# Patient Record
Sex: Male | Born: 1935 | Race: White | Hispanic: No | Marital: Single | Smoking: Former smoker
Health system: Southern US, Community
[De-identification: ages and names within clinical notes are randomized; demographics above are authoritative.]

## PROBLEM LIST (undated history)

## (undated) DIAGNOSIS — E1159 Type 2 diabetes mellitus with other circulatory complications: Secondary | ICD-10-CM

## (undated) DIAGNOSIS — I2510 Atherosclerotic heart disease of native coronary artery without angina pectoris: Secondary | ICD-10-CM

## (undated) DIAGNOSIS — Z860100 Personal history of colon polyps, unspecified: Secondary | ICD-10-CM

## (undated) DIAGNOSIS — I10 Essential (primary) hypertension: Secondary | ICD-10-CM

## (undated) DIAGNOSIS — I119 Hypertensive heart disease without heart failure: Secondary | ICD-10-CM

## (undated) DIAGNOSIS — E785 Hyperlipidemia, unspecified: Secondary | ICD-10-CM

## (undated) DIAGNOSIS — C259 Malignant neoplasm of pancreas, unspecified: Secondary | ICD-10-CM

## (undated) DIAGNOSIS — C439 Malignant melanoma of skin, unspecified: Secondary | ICD-10-CM

## (undated) DIAGNOSIS — I350 Nonrheumatic aortic (valve) stenosis: Secondary | ICD-10-CM

## (undated) HISTORY — DX: Atherosclerotic heart disease of native coronary artery without angina pectoris: I25.10

## (undated) HISTORY — DX: Nonrheumatic aortic (valve) stenosis: I35.0

## (undated) HISTORY — DX: Type 2 diabetes mellitus with other circulatory complications: E11.59

## (undated) HISTORY — PX: APPENDECTOMY: SHX54

## (undated) HISTORY — DX: Hypertensive heart disease without heart failure: I11.9

## (undated) HISTORY — PX: MASTOID DEBRIDEMENT: SHX2002

## (undated) HISTORY — PX: COLONOSCOPY: SHX174

## (undated) HISTORY — PX: CARDIAC CATHETERIZATION: SHX172

## (undated) HISTORY — DX: Hyperlipidemia, unspecified: E78.5

---
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
## 2001-04-23 HISTORY — PX: OTHER SURGICAL HISTORY: SHX169

## 2002-04-22 ENCOUNTER — Ambulatory Visit (HOSPITAL_BASED_OUTPATIENT_CLINIC_OR_DEPARTMENT_OTHER): Admission: RE | Admit: 2002-04-22 | Discharge: 2002-04-22 | Payer: Self-pay | Admitting: General Surgery

## 2002-04-23 HISTORY — PX: CORONARY STENT PLACEMENT: SHX1402

## 2002-07-02 ENCOUNTER — Encounter: Payer: Self-pay | Admitting: Emergency Medicine

## 2002-07-02 ENCOUNTER — Inpatient Hospital Stay (HOSPITAL_COMMUNITY): Admission: EM | Admit: 2002-07-02 | Discharge: 2002-07-04 | Payer: Self-pay | Admitting: Emergency Medicine

## 2006-04-10 ENCOUNTER — Ambulatory Visit: Payer: Self-pay | Admitting: Internal Medicine

## 2006-05-15 ENCOUNTER — Ambulatory Visit: Payer: Self-pay | Admitting: Internal Medicine

## 2011-05-17 ENCOUNTER — Other Ambulatory Visit: Payer: Self-pay | Admitting: Cardiology

## 2011-05-22 ENCOUNTER — Encounter (INDEPENDENT_AMBULATORY_CARE_PROVIDER_SITE_OTHER): Payer: Self-pay | Admitting: Surgery

## 2011-05-24 ENCOUNTER — Ambulatory Visit (INDEPENDENT_AMBULATORY_CARE_PROVIDER_SITE_OTHER): Payer: Medicare Other | Admitting: Surgery

## 2011-05-24 ENCOUNTER — Encounter (INDEPENDENT_AMBULATORY_CARE_PROVIDER_SITE_OTHER): Payer: Self-pay | Admitting: Surgery

## 2011-05-24 ENCOUNTER — Encounter (INDEPENDENT_AMBULATORY_CARE_PROVIDER_SITE_OTHER): Payer: Self-pay | Admitting: General Surgery

## 2011-05-24 VITALS — BP 134/78 | HR 60 | Temp 97.9°F | Ht 68.0 in | Wt 210.0 lb

## 2011-05-24 DIAGNOSIS — C439 Malignant melanoma of skin, unspecified: Secondary | ICD-10-CM

## 2011-05-24 NOTE — Progress Notes (Signed)
Patient ID: Danny Waters, male   DOB: [birth year], 78 y.o.   MRN: [id number]  Chief Complaint  Patient presents with  . Pre-op Exam    eval melanoma    HPI Danny Waters is a 78 y.o. male.   HPIThis gentleman is referred by Dr. Donzetta Starch for evaluation of recently biopsied melanoma is on his shoulders arms and back. He has had melanomas excised in the past. He currently has no complaints.  Past Medical History  Diagnosis Date  . Hyperlipidemia   . Heart attack     Past Surgical History  Procedure Date  . Malignanat melanoma 2003  . Hernia repair 1957    Family History  Problem Relation Age of Onset  . Heart disease Mother     Social History History  Substance Use Topics  . Smoking status: Former Smoker    Quit date: 05/23/1958  . Smokeless tobacco: Not on file  . Alcohol Use: No    No Known Allergies  Current Outpatient Prescriptions  Medication Sig Dispense Refill  . amLODipine (NORVASC) 5 MG tablet       . atorvastatin (LIPITOR) 40 MG tablet       . losartan-hydrochlorothiazide (HYZAAR) 100-25 MG per tablet       . metoprolol succinate (TOPROL-XL) 100 MG 24 hr tablet         Review of Systems Review of Systems  Constitutional: Negative for fever, chills and unexpected weight change.  HENT: Negative for hearing loss, congestion, sore throat, trouble swallowing and voice change.   Eyes: Negative for visual disturbance.  Respiratory: Negative for cough and wheezing.   Cardiovascular: Negative for chest pain, palpitations and leg swelling.  Gastrointestinal: Negative for nausea, vomiting, abdominal pain, diarrhea, constipation, blood in stool, abdominal distention, anal bleeding and rectal pain.  Genitourinary: Negative for hematuria and difficulty urinating.  Musculoskeletal: Negative for arthralgias.  Skin: Negative for rash and wound.  Neurological: Negative for seizures, syncope, weakness and headaches.  Hematological: Negative for adenopathy. Does not  bruise/bleed easily.  Psychiatric/Behavioral: Negative for confusion.    Blood pressure 134/78, pulse 60, temperature 97.9 F (36.6 C), temperature source Temporal, height 5\' 8"  (1.727 m), weight 210 lb (95.255 kg), SpO2 94.00%.  Physical Exam Physical Exam  Constitutional: He is oriented to person, place, and time. He appears well-developed and well-nourished.  HENT:  Head: Normocephalic and atraumatic.  Right Ear: External ear normal.  Left Ear: External ear normal.  Nose: Nose normal.  Mouth/Throat: Oropharynx is clear and moist. No oropharyngeal exudate.  Eyes: Conjunctivae and EOM are normal. Pupils are equal, round, and reactive to light. No scleral icterus.  Neck: Normal range of motion. Neck supple. No tracheal deviation present. No thyromegaly present.  Cardiovascular: Normal rate, regular rhythm, normal heart sounds and intact distal pulses.   No murmur heard. Pulmonary/Chest: Effort normal and breath sounds normal. No respiratory distress.  Abdominal: Soft. Bowel sounds are normal. He exhibits no distension. There is no tenderness. There is no rebound.  Musculoskeletal: Normal range of motion. He exhibits no edema and no tenderness.  Lymphadenopathy:    He has no cervical adenopathy.    He has no axillary adenopathy.       Right: No inguinal adenopathy present.       Left: No inguinal adenopathy present.  Neurological: He is alert and oriented to person, place, and time.  Skin: Skin is warm and dry. No rash noted. No erythema.       Biopsy  sites on the right posterior shoulder, left upper back, and left lateral upper arm are all healing well without evidence of infection.  Psychiatric: His behavior is normal. Judgment normal.    Data Reviewed I have reviewed the notes from dermatology as well as the pathology reports. The upper back and shoulder lesions or melanoma in situ. The left upper arm lesion shows malignant melanoma which is at least 6 mm in  depth.  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 I cannot feel any left axillary adenopathy. I would discuss with some my partners whether or not we should do sentinel lymph nodes. I suspect he would already have nodal disease at the depth melanoma was found to be adequate. Given his age I do not believe he would need an axillary dissection. I will need to get cardiac clearance prior to surgery. We will notify Dr. Donnie Aho,       Abigail Miyamoto A 05/24/2011, 3:22 PM

## 2011-05-25 ENCOUNTER — Telehealth: Payer: Self-pay | Admitting: Internal Medicine

## 2011-05-25 NOTE — Telephone Encounter (Signed)
pt aware of  2/6 appt and will call dr Yetta Barre to find out reason of appt   aom

## 2011-05-28 ENCOUNTER — Other Ambulatory Visit (INDEPENDENT_AMBULATORY_CARE_PROVIDER_SITE_OTHER): Payer: Self-pay | Admitting: Surgery

## 2011-05-28 ENCOUNTER — Telehealth: Payer: Self-pay | Admitting: Internal Medicine

## 2011-05-28 NOTE — Telephone Encounter (Signed)
Referred by Dr. Donzetta Starch Dx- Malignant Melanoma

## 2011-05-29 ENCOUNTER — Other Ambulatory Visit: Payer: Self-pay | Admitting: Internal Medicine

## 2011-05-29 DIAGNOSIS — C439 Malignant melanoma of skin, unspecified: Secondary | ICD-10-CM

## 2011-05-30 ENCOUNTER — Ambulatory Visit: Payer: Self-pay

## 2011-05-30 ENCOUNTER — Encounter (INDEPENDENT_AMBULATORY_CARE_PROVIDER_SITE_OTHER): Payer: Self-pay

## 2011-05-30 ENCOUNTER — Other Ambulatory Visit: Payer: Self-pay | Admitting: Lab

## 2011-05-30 ENCOUNTER — Telehealth: Payer: Self-pay | Admitting: Internal Medicine

## 2011-05-30 ENCOUNTER — Ambulatory Visit (HOSPITAL_BASED_OUTPATIENT_CLINIC_OR_DEPARTMENT_OTHER): Payer: Medicare Other | Admitting: Internal Medicine

## 2011-05-30 ENCOUNTER — Encounter: Payer: Self-pay | Admitting: Internal Medicine

## 2011-05-30 VITALS — BP 135/79 | HR 62 | Temp 98.6°F | Ht 68.0 in | Wt 208.4 lb

## 2011-05-30 DIAGNOSIS — C439 Malignant melanoma of skin, unspecified: Secondary | ICD-10-CM

## 2011-05-30 LAB — CBC WITH DIFFERENTIAL/PLATELET
Basophils Absolute: 0 10*3/uL (ref 0.0–0.1)
EOS%: 1.1 % (ref 0.0–7.0)
Eosinophils Absolute: 0.1 10*3/uL (ref 0.0–0.5)
HCT: 43.7 % (ref 38.4–49.9)
HGB: 15.1 g/dL (ref 13.0–17.1)
MCH: 32.3 pg (ref 27.2–33.4)
MCV: 93.3 fL (ref 79.3–98.0)
MONO%: 5 % (ref 0.0–14.0)
NEUT#: 6.3 10*3/uL (ref 1.5–6.5)
NEUT%: 67.5 % (ref 39.0–75.0)
RDW: 13.8 % (ref 11.0–14.6)

## 2011-05-30 LAB — COMPREHENSIVE METABOLIC PANEL
Albumin: 4.2 g/dL (ref 3.5–5.2)
BUN: 18 mg/dL (ref 6–23)
CO2: 30 mEq/L (ref 19–32)
Glucose, Bld: 161 mg/dL — ABNORMAL HIGH (ref 70–99)
Potassium: 3.9 mEq/L (ref 3.5–5.3)
Sodium: 141 mEq/L (ref 135–145)
Total Bilirubin: 1 mg/dL (ref 0.3–1.2)
Total Protein: 6.5 g/dL (ref 6.0–8.3)

## 2011-05-30 NOTE — Telephone Encounter (Signed)
appts made for 2/26 and 2/27 and printed,contrast given aom

## 2011-05-30 NOTE — Progress Notes (Signed)
[facility] CONSULT NOTE  REASON FOR CONSULTATION:  78 years old white male diagnosed with malignant melanoma  HPI MERVIL WACKER is a 78 y.o. male was past medical history significant for coronary artery disease, hypertension, dyslipidemia, obesity, diabetes mellitus and history of aortic valve stenosis. The patient mentions that 8 years ago he was diagnosed with melanoma in the mid upper back. He had a wide excision at that time by Dr. Maple Hudson. Recently he noted a lesion on the left arm that was itching and start bleeding. He was seen by his dermatologist Dr. Donzetta Starch. He was found to have 3 lesions, one in the upper back and biopsy showed melanoma in situ, the second was in the right posterior shoulder and this also was consistent with melanoma in situ, the stab-wound was on the left upper arm and this was consistent with malignant melanoma, Clark's level IV, Breslow's measurement at least 6 mm, mitosis was more than 20//MM square, which is absent regression and positive ulceration. There was no vascular invasion by the peripheral and deep margins were involved with melanoma. The pathologic stage was T4b, pNX, pMX . This was a large ulcerated nodular melanoma with spindle cell morphology and very brisk mitotic activity. The patient was referred to Dr. Abigail Miyamoto but no decision was made yet regarding the time for surgical excision. The patient was referred to me today for evaluation and recommendation regarding his malignant melanoma. He is feeling fine with no specific complaints. He intentionally lost around 30 pounds over the last few months. He has no chest pain or shortness of breath, no cough or hemoptysis, no headache or blurry vision. He is currently retired but used to work at the [employer].  @SFHPI @  Past Medical History  Diagnosis Date  . Hyperlipidemia   . Heart attack     Past Surgical History  Procedure Date  . Malignanat melanoma 2003  .  Hernia repair 1957    Family History  Problem Relation Age of Onset  . Heart disease Mother     Social History History  Substance Use Topics  . Smoking status: Former Smoker    Quit date: 05/23/1958  . Smokeless tobacco: Not on file  . Alcohol Use: No    No Known Allergies  Current Outpatient Prescriptions  Medication Sig Dispense Refill  . amLODipine (NORVASC) 5 MG tablet       . aspirin 81 MG tablet Take 160 mg by mouth daily.      Marland Kitchen atorvastatin (LIPITOR) 40 MG tablet       . losartan-hydrochlorothiazide (HYZAAR) 100-25 MG per tablet       . metoprolol succinate (TOPROL-XL) 100 MG 24 hr tablet       . Multiple Vitamin (MULTIVITAMIN) tablet Take 1 tablet by mouth daily.      . calcium carbonate (TUMS - DOSED IN MG ELEMENTAL CALCIUM) 500 MG chewable tablet Chew 1 tablet by mouth daily.        Review of Systems  A comprehensive review of systems was negative.  Physical Exam  ZOX:WRUEA, healthy, no distress, well nourished and well developed SKIN: positive for: scar from previous melanoma biopsies in left arm, right shoulder and back. HEAD: Normocephalic, No masses, lesions, tenderness or abnormalities EYES: normal, PERRLA EARS: External ears normal OROPHARYNX:no exudate, no erythema and lips, buccal mucosa, and tongue normal  NECK: supple, no adenopathy LYMPH:  no palpable lymphadenopathy, no hepatosplenomegaly LUNGS: clear to auscultation , and  palpation HEART: regular rate & rhythm, no murmurs and no gallops ABDOMEN:abdomen soft, non-tender and normal bowel sounds BACK: Back symmetric, no curvature. EXTREMITIES:no joint deformities, effusion, or inflammation, no edema, no skin discoloration, no clubbing, no cyanosis  NEURO: alert & oriented x 3 with fluent speech, no focal motor/sensory deficits, gait normal  ASSESSMENT: This is a very pleasant 78 years old white male recently diagnosed with at least stage IIC (T4b, Nx, Mx) left arm. There is also has melanoma in  situ involving the right shoulder and upper back. I have a lengthy discussion with the patient today about his disease stage, prognosis and treatment options.  PLAN: #1 the patient will need wide excision and sentinel lymph node biopsy of the left arm lesion to rule out metastatic spread to the regional lymph nodes specially with the deep thickness of the melanoma. He is already seen by Dr. Magnus Ivan for this procedure pathology is not scheduled yet. #2 I ordered a CT scan of the head, chest, abdomen and pelvis to rule out any metastatic disease. #3 I would see the patient back for followup visit in 3 weeks for reevaluation. Hopefully he would have his wide excision and sentinel lymph node biopsy performed by that time. The patient was advised to call me immediately if he has any concerning symptoms in the interval.  All questions were answered. The patient knows to call the clinic with any problems, questions or concerns. We can certainly see the patient much sooner if necessary.  Thank you so much for allowing me to participate in the care of Danny Waters. I will continue to follow up the patient with you and assist in his care.  I spent 30 minutes counseling the patient face to face. The total time spent in the appointment was 55 minutes.   Jaymeson Mengel K. 05/30/2011, 9:59 PM

## 2011-05-31 ENCOUNTER — Encounter (HOSPITAL_COMMUNITY): Payer: Self-pay | Admitting: Pharmacy Technician

## 2011-06-05 ENCOUNTER — Encounter (HOSPITAL_COMMUNITY): Payer: Self-pay

## 2011-06-05 ENCOUNTER — Encounter (HOSPITAL_COMMUNITY)
Admission: RE | Admit: 2011-06-05 | Discharge: 2011-06-05 | Disposition: A | Discharge status: Home / Self Care | Payer: Medicare Other | Source: Ambulatory Visit | Attending: Surgery | Admitting: Surgery

## 2011-06-05 ENCOUNTER — Encounter (HOSPITAL_COMMUNITY)
Admission: RE | Admit: 2011-06-05 | Discharge: 2011-06-05 | Disposition: A | Discharge status: Home / Self Care | Payer: Medicare Other | Source: Ambulatory Visit | Attending: Anesthesiology | Admitting: Anesthesiology

## 2011-06-05 ENCOUNTER — Telehealth (INDEPENDENT_AMBULATORY_CARE_PROVIDER_SITE_OTHER): Payer: Self-pay

## 2011-06-05 HISTORY — DX: Personal history of colon polyps, unspecified: Z86.0100

## 2011-06-05 HISTORY — DX: Essential (primary) hypertension: I10

## 2011-06-05 HISTORY — DX: Personal history of colonic polyps: Z86.010

## 2011-06-05 LAB — SURGICAL PCR SCREEN
MRSA, PCR: NEGATIVE
Staphylococcus aureus: POSITIVE — AB

## 2011-06-05 NOTE — Progress Notes (Signed)
Dr.tilley is cardiologist;last visit 3wks ago-to request OV  To request echo report-pt states it was done a couple of months ago Stress test done about 3-82yrs ago to also request report  EKG done 3wks ago-to request

## 2011-06-05 NOTE — Pre-Procedure Instructions (Signed)
20 Danny Waters  06/05/2011   Your procedure is scheduled on:  Thurs, Feb 14 @ 1255pm  Report to [facility] at [phone] AM.  Call this number if you have problems the morning of surgery: [phone]   Remember:   Do not eat food:After Midnight.  May have clear liquids: up to 4 Hours before arrival.(until 5:45am)  Clear liquids include soda, tea, black coffee, apple or grape juice, broth.  Take these medicines the morning of surgery with A SIP OF WATER: Amlodipine and Metoprolol   Do not wear jewelry, make-up or nail polish.  Do not wear lotions, powders, or perfumes. You may wear deodorant.  Do not shave 48 hours prior to surgery.  Do not bring valuables to the hospital.  Contacts, dentures or bridgework may not be worn into surgery.  Leave suitcase in the car. After surgery it may be brought to your room.  For patients admitted to the hospital, checkout time is 11:00 AM the day of discharge.   Patients discharged the day of surgery will not be allowed to drive home.  Name and phone number of your driver:   Special Instructions: CHG Shower Use Special Wash: 1/2 bottle night before surgery and 1/2 bottle morning of surgery.   Please read over the following fact sheets that you were given: Pain Booklet, Coughing and Deep Breathing, MRSA Information and Surgical Site Infection Prevention

## 2011-06-05 NOTE — Telephone Encounter (Signed)
Christy for [facility] called to report that Danny Waters [birth year] is to have surgery on 06/07/2011.  He does not have anyone to be with him after the surgery. (MELANOMA EXCISION Right Choice wide excision melanoma right shoulder and left back and left upper arm).

## 2011-06-05 NOTE — Telephone Encounter (Signed)
That's ok.

## 2011-06-05 NOTE — Progress Notes (Signed)
Spoke with Robin at [facility] office and notified that pt has no one that can stay with him for the 1st 24hr after surgery-will let Dr.Blackman know

## 2011-06-05 NOTE — Progress Notes (Signed)
Per Dr.Tilley's office-last ekg was done 05/09/10--will need DOS

## 2011-06-06 MED ORDER — CEFAZOLIN SODIUM-DEXTROSE 2-3 GM-% IV SOLR
2.0000 g | INTRAVENOUS | Status: AC
  Administered 2011-06-07: 2 g via INTRAVENOUS
  Filled 2011-06-06: qty 50

## 2011-06-06 NOTE — H&P (Signed)
HPI  Danny Waters is a 78 y.o. male.  HPIThis gentleman is referred by Dr. Donzetta Waters for evaluation of recently biopsied melanoma is on his shoulders arms and back. He has had melanomas excised in the past. He currently has no complaints.  Past Medical History   Diagnosis  Date   .  Hyperlipidemia    .  Heart attack     Past Surgical History   Procedure  Date   .  Malignanat melanoma  2003   .  Hernia repair  1957    Family History   Problem  Relation  Age of Onset   .  Heart disease  Mother     Social History  History   Substance Use Topics   .  Smoking status:  Former Smoker     Quit date:  05/23/1958   .  Smokeless tobacco:  Not on file   .  Alcohol Use:  No    No Known Allergies  Current Outpatient Prescriptions   Medication  Sig  Dispense  Refill   .  amLODipine (NORVASC) 5 MG tablet      .  atorvastatin (LIPITOR) 40 MG tablet      .  losartan-hydrochlorothiazide (HYZAAR) 100-25 MG per tablet      .  metoprolol succinate (TOPROL-XL) 100 MG 24 hr tablet       Review of Systems  Review of Systems  Constitutional: Negative for fever, chills and unexpected weight change.  HENT: Negative for hearing loss, congestion, sore throat, trouble swallowing and voice change.  Eyes: Negative for visual disturbance.  Respiratory: Negative for cough and wheezing.  Cardiovascular: Negative for chest pain, palpitations and leg swelling.  Gastrointestinal: Negative for nausea, vomiting, abdominal pain, diarrhea, constipation, blood in stool, abdominal distention, anal bleeding and rectal pain.  Genitourinary: Negative for hematuria and difficulty urinating.  Musculoskeletal: Negative for arthralgias.  Skin: Negative for rash and wound.  Neurological: Negative for seizures, syncope, weakness and headaches.  Hematological: Negative for adenopathy. Does not bruise/bleed easily.  Psychiatric/Behavioral: Negative for confusion.   Blood pressure 134/78, pulse 60, temperature 97.9 F  (36.6 C), temperature source Temporal, height 5\' 8"  (1.727 m), weight 210 lb (95.255 kg), SpO2 94.00%.  Physical Exam  Physical Exam  Constitutional: He is oriented to person, place, and time. He appears well-developed and well-nourished.  HENT:  Head: Normocephalic and atraumatic.  Right Ear: External ear normal.  Left Ear: External ear normal.  Nose: Nose normal.  Mouth/Throat: Oropharynx is clear and moist. No oropharyngeal exudate.  Eyes: Conjunctivae and EOM are normal. Pupils are equal, round, and reactive to light. No scleral icterus.  Neck: Normal range of motion. Neck supple. No tracheal deviation present. No thyromegaly present.  Cardiovascular: Normal rate, regular rhythm, normal heart sounds and intact distal pulses.  No murmur heard.  Pulmonary/Chest: Effort normal and breath sounds normal. No respiratory distress.  Abdominal: Soft. Bowel sounds are normal. He exhibits no distension. There is no tenderness. There is no rebound.  Musculoskeletal: Normal range of motion. He exhibits no edema and no tenderness.  Lymphadenopathy:  He has no cervical adenopathy.  He has no axillary adenopathy.  Right: No inguinal adenopathy present.  Left: No inguinal adenopathy present.  Neurological: He is alert and oriented to person, place, and time.  Skin: Skin is warm and dry. No rash noted. No erythema.  Biopsy sites on the right posterior shoulder, left upper back, and left lateral upper arm are all healing well  without evidence of infection.  Psychiatric: His behavior is normal. Judgment normal.   Data Reviewed  I have reviewed the notes from dermatology as well as the pathology reports. The upper back and shoulder lesions or melanoma in situ. The left upper arm lesion shows malignant melanoma which is at least 6 mm in depth.  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 I cannot feel any left axillary adenopathy. I would discuss with some my partners whether or not we should do sentinel lymph nodes. I suspect he would already have nodal disease at the depth melanoma was found to be adequate. Given his age I do not believe he would need an axillary dissection. I will need to get cardiac clearance prior to surgery. We will notify Dr. Donnie Waters,   [facility] A  Addendum: I have discussed this with other surgeons.  Sentinel node biopsy not warranted.  Cardiac clearance achieved.  Will proceed to OR for wide excision

## 2011-06-07 ENCOUNTER — Other Ambulatory Visit: Payer: Self-pay

## 2011-06-07 ENCOUNTER — Encounter (HOSPITAL_COMMUNITY): Payer: Self-pay | Admitting: Anesthesiology

## 2011-06-07 ENCOUNTER — Encounter (HOSPITAL_COMMUNITY): Payer: Self-pay | Admitting: *Deleted

## 2011-06-07 ENCOUNTER — Ambulatory Visit (HOSPITAL_COMMUNITY)
Admission: RE | Admit: 2011-06-07 | Discharge: 2011-06-07 | Disposition: A | Discharge status: Home / Self Care | Payer: Medicare Other | Source: Ambulatory Visit | Attending: Surgery | Admitting: Surgery

## 2011-06-07 ENCOUNTER — Ambulatory Visit (HOSPITAL_COMMUNITY): Payer: Medicare Other | Admitting: Anesthesiology

## 2011-06-07 ENCOUNTER — Other Ambulatory Visit (INDEPENDENT_AMBULATORY_CARE_PROVIDER_SITE_OTHER): Payer: Self-pay | Admitting: Surgery

## 2011-06-07 ENCOUNTER — Encounter (HOSPITAL_COMMUNITY)
Admission: RE | Disposition: A | Discharge status: Home / Self Care | Payer: Self-pay | Source: Ambulatory Visit | Attending: Surgery

## 2011-06-07 DIAGNOSIS — E119 Type 2 diabetes mellitus without complications: Secondary | ICD-10-CM | POA: Insufficient documentation

## 2011-06-07 DIAGNOSIS — E669 Obesity, unspecified: Secondary | ICD-10-CM | POA: Insufficient documentation

## 2011-06-07 DIAGNOSIS — Z01812 Encounter for preprocedural laboratory examination: Secondary | ICD-10-CM | POA: Insufficient documentation

## 2011-06-07 DIAGNOSIS — Z01818 Encounter for other preprocedural examination: Secondary | ICD-10-CM | POA: Insufficient documentation

## 2011-06-07 DIAGNOSIS — C4360 Malignant melanoma of unspecified upper limb, including shoulder: Secondary | ICD-10-CM

## 2011-06-07 DIAGNOSIS — C4359 Malignant melanoma of other part of trunk: Secondary | ICD-10-CM | POA: Insufficient documentation

## 2011-06-07 DIAGNOSIS — I2510 Atherosclerotic heart disease of native coronary artery without angina pectoris: Secondary | ICD-10-CM | POA: Insufficient documentation

## 2011-06-07 DIAGNOSIS — I10 Essential (primary) hypertension: Secondary | ICD-10-CM | POA: Insufficient documentation

## 2011-06-07 HISTORY — PX: MELANOMA EXCISION: SHX5266

## 2011-06-07 LAB — GLUCOSE, CAPILLARY

## 2011-06-07 SURGERY — EXCISION, MELANOMA
Anesthesia: Monitor Anesthesia Care | Site: Shoulder | Laterality: Right | Wound class: Clean

## 2011-06-07 MED ORDER — ONDANSETRON HCL 4 MG/2ML IJ SOLN: 4.0000 mg | Freq: Four times a day (QID) | INTRAMUSCULAR | Status: DC | PRN

## 2011-06-07 MED ORDER — HYDROCODONE-ACETAMINOPHEN 5-325 MG PO TABS: 1.0000 | ORAL_TABLET | ORAL | Status: AC | PRN

## 2011-06-07 MED ORDER — ACETAMINOPHEN 650 MG RE SUPP: 650.0000 mg | RECTAL | Status: DC | PRN

## 2011-06-07 MED ORDER — SODIUM CHLORIDE 0.9 % IJ SOLN: 3.0000 mL | Freq: Two times a day (BID) | INTRAMUSCULAR | Status: DC

## 2011-06-07 MED ORDER — PROPOFOL 10 MG/ML IV EMUL
INTRAVENOUS | Status: DC | PRN
  Administered 2011-06-07: 30 mg via INTRAVENOUS

## 2011-06-07 MED ORDER — LACTATED RINGERS IV SOLN
INTRAVENOUS | Status: DC
  Administered 2011-06-07: 12:00:00 via INTRAVENOUS

## 2011-06-07 MED ORDER — SODIUM CHLORIDE 0.9 % IJ SOLN: 3.0000 mL | INTRAMUSCULAR | Status: DC | PRN

## 2011-06-07 MED ORDER — FENTANYL CITRATE 0.05 MG/ML IJ SOLN: 50.0000 ug | INTRAMUSCULAR | Status: DC | PRN

## 2011-06-07 MED ORDER — LORAZEPAM 2 MG/ML IJ SOLN: 1.0000 mg | Freq: Once | INTRAMUSCULAR | Status: DC | PRN

## 2011-06-07 MED ORDER — ACETAMINOPHEN 325 MG PO TABS: 650.0000 mg | ORAL_TABLET | ORAL | Status: DC | PRN

## 2011-06-07 MED ORDER — SODIUM CHLORIDE 0.9 % IV SOLN: 250.0000 mL | INTRAVENOUS | Status: DC | PRN

## 2011-06-07 MED ORDER — FENTANYL CITRATE 0.05 MG/ML IJ SOLN
INTRAMUSCULAR | Status: DC | PRN
  Administered 2011-06-07: 100 ug via INTRAVENOUS
  Administered 2011-06-07 (×2): 25 ug via INTRAVENOUS

## 2011-06-07 MED ORDER — HYDROMORPHONE HCL PF 1 MG/ML IJ SOLN: 0.2500 mg | INTRAMUSCULAR | Status: DC | PRN

## 2011-06-07 MED ORDER — MIDAZOLAM HCL 2 MG/2ML IJ SOLN: 1.0000 mg | INTRAMUSCULAR | Status: DC | PRN

## 2011-06-07 MED ORDER — PROMETHAZINE HCL 25 MG/ML IJ SOLN: 6.2500 mg | INTRAMUSCULAR | Status: DC | PRN

## 2011-06-07 MED ORDER — MORPHINE SULFATE 2 MG/ML IJ SOLN: 2.0000 mg | INTRAMUSCULAR | Status: DC | PRN

## 2011-06-07 MED ORDER — LACTATED RINGERS IV SOLN
INTRAVENOUS | Status: DC | PRN
  Administered 2011-06-07: 12:00:00 via INTRAVENOUS

## 2011-06-07 MED ORDER — LIDOCAINE HCL 1 % IJ SOLN
INTRAMUSCULAR | Status: DC | PRN
  Administered 2011-06-07: 13:00:00 via SUBCUTANEOUS

## 2011-06-07 MED ORDER — PROMETHAZINE HCL 25 MG/ML IJ SOLN: 12.5000 mg | Freq: Four times a day (QID) | INTRAMUSCULAR | Status: DC | PRN

## 2011-06-07 MED ORDER — OXYCODONE HCL 5 MG PO TABS: 5.0000 mg | ORAL_TABLET | ORAL | Status: DC | PRN

## 2011-06-07 MED ORDER — 0.9 % SODIUM CHLORIDE (POUR BTL) OPTIME
TOPICAL | Status: DC | PRN
  Administered 2011-06-07: 1000 mL

## 2011-06-07 SURGICAL SUPPLY — 48 items
APL SKNCLS STERI-STRIP NONHPOA (GAUZE/BANDAGES/DRESSINGS) ×1
BENZOIN TINCTURE PRP APPL 2/3 (GAUZE/BANDAGES/DRESSINGS) ×2 IMPLANT
BLADE SURG 10 STRL SS (BLADE) ×2 IMPLANT
BLADE SURG 15 STRL LF DISP TIS (BLADE) ×1 IMPLANT
BLADE SURG 15 STRL SS (BLADE) ×2
BLADE SURG ROTATE 9660 (MISCELLANEOUS) IMPLANT
CANISTER SUCTION 2500CC (MISCELLANEOUS) IMPLANT
CHLORAPREP W/TINT 26ML (MISCELLANEOUS) ×2 IMPLANT
CLOTH BEACON ORANGE TIMEOUT ST (SAFETY) ×2 IMPLANT
COVER SURGICAL LIGHT HANDLE (MISCELLANEOUS) ×2 IMPLANT
DECANTER SPIKE VIAL GLASS SM (MISCELLANEOUS) ×2 IMPLANT
DRAPE ORTHO SPLIT 77X108 STRL (DRAPES) ×4
DRAPE PED LAPAROTOMY (DRAPES) IMPLANT
DRAPE SURG ORHT 6 SPLT 77X108 (DRAPES) IMPLANT
ELECT CAUTERY BLADE 6.4 (BLADE) ×2 IMPLANT
ELECT REM PT RETURN 9FT ADLT (ELECTROSURGICAL) ×2
ELECTRODE REM PT RTRN 9FT ADLT (ELECTROSURGICAL) ×1 IMPLANT
GAUZE SPONGE 4X4 16PLY XRAY LF (GAUZE/BANDAGES/DRESSINGS) ×2 IMPLANT
GLOVE SURG SIGNA 7.5 PF LTX (GLOVE) ×2 IMPLANT
GOWN PREVENTION PLUS XLARGE (GOWN DISPOSABLE) ×2 IMPLANT
GOWN STRL NON-REIN LRG LVL3 (GOWN DISPOSABLE) ×2 IMPLANT
KIT BASIN OR (CUSTOM PROCEDURE TRAY) ×2 IMPLANT
KIT ROOM TURNOVER OR (KITS) ×2 IMPLANT
NDL HYPO 25GX1X1/2 BEV (NEEDLE) ×1 IMPLANT
NEEDLE HYPO 25GX1X1/2 BEV (NEEDLE) ×2 IMPLANT
NS IRRIG 1000ML POUR BTL (IV SOLUTION) ×2 IMPLANT
PACK SURGICAL SETUP 50X90 (CUSTOM PROCEDURE TRAY) ×2 IMPLANT
PAD ARMBOARD 7.5X6 YLW CONV (MISCELLANEOUS) ×4 IMPLANT
PENCIL BUTTON HOLSTER BLD 10FT (ELECTRODE) ×2 IMPLANT
SPONGE GAUZE 4X4 12PLY (GAUZE/BANDAGES/DRESSINGS) ×4 IMPLANT
SPONGE LAP 18X18 X RAY DECT (DISPOSABLE) ×2 IMPLANT
STRIP CLOSURE SKIN 1/2X4 (GAUZE/BANDAGES/DRESSINGS) IMPLANT
SUT ETHILON 2 0 FS 18 (SUTURE) ×4 IMPLANT
SUT ETHILON 3 0 FSL (SUTURE) IMPLANT
SUT MNCRL AB 4-0 PS2 18 (SUTURE) IMPLANT
SUT VIC AB 2-0 SH 27 (SUTURE) ×2
SUT VIC AB 2-0 SH 27X BRD (SUTURE) IMPLANT
SUT VIC AB 3-0 SH 27 (SUTURE)
SUT VIC AB 3-0 SH 27XBRD (SUTURE) IMPLANT
SYR BULB 3OZ (MISCELLANEOUS) ×2 IMPLANT
SYR CONTROL 10ML LL (SYRINGE) ×2 IMPLANT
TAPE CLOTH SURG 4X10 WHT LF (GAUZE/BANDAGES/DRESSINGS) ×3 IMPLANT
TOWEL OR 17X24 6PK STRL BLUE (TOWEL DISPOSABLE) ×2 IMPLANT
TOWEL OR 17X26 10 PK STRL BLUE (TOWEL DISPOSABLE) ×2 IMPLANT
TUBE CONNECTING 12X1/4 (SUCTIONS) IMPLANT
UNDERPAD 30X30 INCONTINENT (UNDERPADS AND DIAPERS) ×2 IMPLANT
WATER STERILE IRR 1000ML POUR (IV SOLUTION) IMPLANT
YANKAUER SUCT BULB TIP NO VENT (SUCTIONS) IMPLANT

## 2011-06-07 NOTE — Consult Note (Signed)
Anesthesia:  Patient is a 78 year old male scheduled for wide excision melanoma, right shoulder, left back, and LUA today.  He was seen in PAT on 06/05/11, but I was just brought his chart to review.   His history is significant for severe asymptomatic AS which is followed by Dr. Donnie Aho.  He was just seen on 05/09/11 and had a repeat echo.  Six month follow-up was recommended.  Other history includes HLD, HTN, DM2, former smoker, CAD/MI s/p Taxus stent to his LAD March 2004, HLD, obesity.  EKG shows NSR, LAD, possible anterolateral infarct.  Echo from 05/17/11 showed LVH with normal systolic function, severe AS with valve gradient unchanged, mild LAE, mild mitral annular calcification, mild TR with mild pulmonary HTN.  (Full report on chart for review.)  CXR shows no active disease. Mild elevation of the right hemidiaphragm.   Labs reviewed.  Dr. Eliberto Ivory note states that he was going to notify Dr. Donnie Aho of patients planned procedure for clearance, although I do not see a formal clearance note in Epic. He does have recent Cardiology visit, however.  His Anesthesiologist will evaluate him in the Holding area.

## 2011-06-07 NOTE — Anesthesia Preprocedure Evaluation (Addendum)
Anesthesia Evaluation  Patient identified by MRN, date of birth, ID band Patient awake    Reviewed: Allergy & Precautions, H&P , NPO status , Patient's Chart, lab work & pertinent test results  Airway Mallampati: II TM Distance: >3 FB Neck ROM: Full    Dental   Pulmonary    Pulmonary exam normal       Cardiovascular hypertension, + CAD  Severe AS, EF 55% (04/2011) LAD Taxus stent 2004   Neuro/Psych    GI/Hepatic   Endo/Other  Diabetes mellitus-  Renal/GU      Musculoskeletal   Abdominal (+) obese,   Peds  Hematology   Anesthesia Other Findings   Reproductive/Obstetrics                        Anesthesia Physical Anesthesia Plan  ASA: III  Anesthesia Plan: MAC   Post-op Pain Management:    Induction: Intravenous  Airway Management Planned: Simple Face Mask  Additional Equipment:   Intra-op Plan:   Post-operative Plan:   Informed Consent: I have reviewed the patients History and Physical, chart, labs and discussed the procedure including the risks, benefits and alternatives for the proposed anesthesia with the patient or authorized representative who has indicated his/her understanding and acceptance.     Plan Discussed with: CRNA and Surgeon  Anesthesia Plan Comments:         Anesthesia Quick Evaluation

## 2011-06-07 NOTE — Anesthesia Postprocedure Evaluation (Signed)
Anesthesia Post Note  Patient: Danny Waters  Procedure(s) Performed: Procedure(s) (LRB): MELANOMA EXCISION (Right)  Anesthesia type: MAC  Patient location: PACU  Post pain: Pain level controlled and Adequate analgesia  Post assessment: Post-op Vital signs reviewed, Patient's Cardiovascular Status Stable and Respiratory Function Stable  Last Vitals:  Filed Vitals:   06/07/11 1345  BP: 107/56  Pulse: 63  Temp:   Resp: 18    Post vital signs: Reviewed and stable  Level of consciousness: awake, alert  and oriented  Complications: No apparent anesthesia complications

## 2011-06-07 NOTE — Interval H&P Note (Signed)
History and Physical Interval Note:  He has had no change in his history or exam  06/07/2011 11:47 AM  Danny Waters  has presented today for surgery, with the diagnosis of melanoma right shoulder left back left upper arm   The various methods of treatment have been discussed with the patient and family. After consideration of risks, benefits and other options for treatment, the patient has consented to  Procedure(s) (LRB): MELANOMA EXCISION RIGHT SHOULDER, LEFT BACK, LEFT POSTERIOR ARM (Right) as a surgical intervention .  The patients' history has been reviewed, patient examined, no change in status, stable for surgery.  I have reviewed the patients' chart and labs.  Questions were answered to the patient's satisfaction.     [name] A

## 2011-06-07 NOTE — Preoperative (Signed)
Beta Blockers   Reason not to administer Beta Blockers:Not Applicable 

## 2011-06-07 NOTE — Op Note (Signed)
06/07/2011  1:18 PM  PATIENT:  Danny Waters  78 y.o. male  PRE-OPERATIVE DIAGNOSIS:  melanoma right shoulder, left back, left upper arm   POST-OPERATIVE DIAGNOSIS:  melanoma right shoulder, left back, left upper arm   PROCEDURE:  Procedure(s) (LRB): 4 CM WIDE EXCISION MALIGNANT MELANOMA LEFT UPPER ARM, MELANOMA IN SITU LEFT BACK AND RIGHT SHOULDER  SURGEON:  Surgeon(s) and Role:    * Shelly Rubenstein, MD - Primary  PHYSICIAN ASSISTANT:   ASSISTANTS: none   ANESTHESIA:   general and IV sedation  EBL:  Total I/O In: 700 [I.V.:700] Out: -   BLOOD ADMINISTERED:none  DRAINS: none   LOCAL MEDICATIONS USED:  MARCAINE    and LIDOCAINE   SPECIMEN:  Excision  DISPOSITION OF SPECIMEN:  PATHOLOGY  COUNTS:  YES  TOURNIQUET:  * No tourniquets in log *  DICTATION: Reubin Milan Dictation Indications: This is a 78 year old gentleman with a malignant melanoma on his left upper arm posteriorly. He also has a melanoma in situ on his mid back toward the left and his posterior right shoulder. The decision has been made to proceed with wide excision of these areas.  Procedure: The patient was brought to the operating room and identified as the correct patient. He was placed on the operating room table anesthesia was induced. The patient was placed in the right lateral decubitus position. His posterior left arm and left back were then prepped and draped in the usual sterile fashion. I anesthetized both areas with lidocaine. Marcaine was mixed in. I then used a scalpel to perform a 4 cm wide excision about the area on the arm and on the back. Both specimens were then removed with the electrocautery. Thereby sent to pathology for evaluation. Hemostasis was achieved with cautery. I closed both wounds with subcuticular Vicryl sutures and then closed both skin incisions with interrupted 2-0 nylon sutures. These wounds were then dressed with dry gauze and tape. The patient was then turned into the left  lateral decubitus position. His right posterior shoulder was prepped and draped in the sterile fashion. I again anesthetized the skin with a mixture of lidocaine and Marcaine. I performed a 4 cm wide excision with a scalpel the melanoma in situ of the right posterior shoulder. I completely excised the rest of the lesion with the cautery. The specimen was sent to pathology as well. I achieved hemostasis with the cautery. I then closed the subcutaneous tissue with interrupted Vicryl sutures and closed the skin with interrupted 2-0 nylon sutures. Gauze was placed on this incision as well. The patient tolerated the procedure well.  all the counts were correct at the end of the procedure. The patient was then taken in a stable condition to the recovery room PLAN OF CARE: Discharge to home after PACU  PATIENT DISPOSITION:  PACU - hemodynamically stable.   Delay start of Pharmacological VTE agent (>24hrs) due to surgical blood loss or risk of bleeding: not applicable

## 2011-06-07 NOTE — Transfer of Care (Signed)
Immediate Anesthesia Transfer of Care Note  Patient: Danny Waters  Procedure(s) Performed: Procedure(s) (LRB): MELANOMA EXCISION (Right)  Patient Location: PACU  Anesthesia Type: MAC  Level of Consciousness: awake, alert  and oriented  Airway & Oxygen Therapy: Patient Spontanous Breathing  Post-op Assessment: Report given to PACU RN  Post vital signs: Reviewed and stable  Complications: No apparent anesthesia complications

## 2011-06-07 NOTE — Discharge Instructions (Signed)
May shower starting tomorrow  Cover with dry bandage daily

## 2011-06-11 ENCOUNTER — Encounter (HOSPITAL_COMMUNITY): Payer: Self-pay | Admitting: Surgery

## 2011-06-18 ENCOUNTER — Encounter (INDEPENDENT_AMBULATORY_CARE_PROVIDER_SITE_OTHER): Payer: Self-pay | Admitting: Surgery

## 2011-06-18 ENCOUNTER — Ambulatory Visit (INDEPENDENT_AMBULATORY_CARE_PROVIDER_SITE_OTHER): Payer: Medicare Other | Admitting: Surgery

## 2011-06-18 VITALS — BP 125/77 | HR 74 | Temp 98.6°F | Resp 18 | Ht 68.0 in | Wt 208.2 lb

## 2011-06-18 DIAGNOSIS — Z09 Encounter for follow-up examination after completed treatment for conditions other than malignant neoplasm: Secondary | ICD-10-CM

## 2011-06-18 NOTE — Progress Notes (Signed)
Subjective:     Patient ID: Danny Waters, male   DOB: [birth year], 78 y.o.   MRN: [id number]  HPI He is here for a postoperative visit. He has no complaints.  Review of Systems     Objective:   Physical Exam On exam, all 3 incision sites are healing well. The final pathology showed no residual in situ disease or malignant melanoma    Assessment:     Patient status post wide excision of 2 areas of melanoma in situ and one deep malignant melanoma of left arm   Plan:     We removed his sutures in place Steri-Strips. He may resume normal activity. I will see him back as needed. His workup is ongoing for evidence of metastatic disease

## 2011-06-19 ENCOUNTER — Ambulatory Visit (HOSPITAL_COMMUNITY)
Admission: RE | Admit: 2011-06-19 | Discharge: 2011-06-19 | Disposition: A | Discharge status: Home / Self Care | Payer: Medicare Other | Source: Ambulatory Visit | Attending: Internal Medicine | Admitting: Internal Medicine

## 2011-06-19 ENCOUNTER — Encounter (HOSPITAL_COMMUNITY): Payer: Self-pay

## 2011-06-19 ENCOUNTER — Other Ambulatory Visit: Payer: Self-pay | Admitting: Lab

## 2011-06-19 DIAGNOSIS — N281 Cyst of kidney, acquired: Secondary | ICD-10-CM | POA: Insufficient documentation

## 2011-06-19 DIAGNOSIS — C439 Malignant melanoma of skin, unspecified: Secondary | ICD-10-CM

## 2011-06-19 DIAGNOSIS — G9389 Other specified disorders of brain: Secondary | ICD-10-CM | POA: Insufficient documentation

## 2011-06-19 DIAGNOSIS — N400 Enlarged prostate without lower urinary tract symptoms: Secondary | ICD-10-CM | POA: Insufficient documentation

## 2011-06-19 DIAGNOSIS — C4359 Malignant melanoma of other part of trunk: Secondary | ICD-10-CM | POA: Insufficient documentation

## 2011-06-19 DIAGNOSIS — K7689 Other specified diseases of liver: Secondary | ICD-10-CM | POA: Insufficient documentation

## 2011-06-19 MED ORDER — IOHEXOL 300 MG/ML  SOLN
100.0000 mL | Freq: Once | INTRAMUSCULAR | Status: AC | PRN
  Administered 2011-06-19: 100 mL via INTRAVENOUS

## 2011-06-20 ENCOUNTER — Ambulatory Visit (HOSPITAL_BASED_OUTPATIENT_CLINIC_OR_DEPARTMENT_OTHER): Payer: Medicare Other | Admitting: Internal Medicine

## 2011-06-20 ENCOUNTER — Other Ambulatory Visit (HOSPITAL_COMMUNITY): Payer: Self-pay

## 2011-06-20 ENCOUNTER — Telehealth: Payer: Self-pay | Admitting: Internal Medicine

## 2011-06-20 VITALS — BP 139/74 | HR 59 | Temp 96.8°F | Ht 68.0 in | Wt 206.5 lb

## 2011-06-20 DIAGNOSIS — C4360 Malignant melanoma of unspecified upper limb, including shoulder: Secondary | ICD-10-CM

## 2011-06-20 DIAGNOSIS — C4359 Malignant melanoma of other part of trunk: Secondary | ICD-10-CM

## 2011-06-20 DIAGNOSIS — C439 Malignant melanoma of skin, unspecified: Secondary | ICD-10-CM

## 2011-06-20 NOTE — Progress Notes (Signed)
[facility] Telephone:([phone]) [phone]   Fax:(336) [phone]  OFFICE PROGRESS NOTE  Danny Palmer, MD, MD 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 Magnus Ivan.  CURRENT THERAPY: None  INTERVAL HISTORY: Danny Waters 78 y.o. male returns to the clinic today for followup visit accompanied his wife. The patient underwent wide excision of the melanoma from the left upper arm under the care of Dr. Magnus Ivan but no sentinel lymph node biopsy was performed. He also has repeat CT scan of the head, chest, abdomen and pelvis performed recently and he is here today for evaluation and discussion of his scan results. He is feeling fine today with no specific complaints. He has no chest pain or shortness of breath. No weight loss or night sweats.  MEDICAL HISTORY: Past Medical History  Diagnosis Date  . Hyperlipidemia     takes Lipitor daily  . Hypertension     takes Amlodipine,Metoprolol,and Losartan daily  . Heart attack 10+yrs ago  . Bruises easily   . Hx of colonic polyps   . Diabetes mellitus     type 2 but doesn't take any meds;diet and exercise controlled  . Cancer     melanoma    ALLERGIES:   has no known allergies.  MEDICATIONS:  Current Outpatient Prescriptions  Medication Sig Dispense Refill  . aspirin 81 MG tablet Take 160 mg by mouth daily.      Marland Kitchen atorvastatin (LIPITOR) 40 MG tablet Take 40 mg by mouth daily.       . calcium carbonate (TUMS - DOSED IN MG ELEMENTAL CALCIUM) 500 MG chewable tablet Chew 1 tablet by mouth daily.      Marland Kitchen losartan-hydrochlorothiazide (HYZAAR) 100-25 MG per tablet Take 1 tablet by mouth daily.       . metoprolol succinate (TOPROL-XL) 100 MG  24 hr tablet Take 100 mg by mouth daily.       . Multiple Vitamin (MULTIVITAMIN) tablet Take 1 tablet by mouth daily.      . mupirocin ointment (BACTROBAN) 2 %       . amLODipine (NORVASC) 5 MG tablet Take 5 mg by mouth daily.         SURGICAL HISTORY:  Past Surgical History  Procedure Date  . Malignanat melanoma 2003  . Hernia repair 1957  . Appendectomy   . Cardiac catheterization 10+yrs ago  . Coronary angioplasty     1 stent  . Colonoscopy   . Melanoma excision 06/07/2011    Procedure: MELANOMA EXCISION;  Surgeon: Shelly Rubenstein, MD;  Location: [facility] OR;  Service: General;  Laterality: Right;  wide excision melanoma right shoulder  and left back and left upper arm     REVIEW OF SYSTEMS:  A comprehensive review of systems was negative.   PHYSICAL EXAMINATION: General appearance: alert, cooperative and no distress Neck: no adenopathy Lymph nodes: Cervical, supraclavicular, and axillary nodes normal. Resp: clear to auscultation bilaterally Cardio: regular rate and rhythm, S1, S2 normal, no murmur, click, rub or gallop GI: soft, non-tender; bowel sounds normal; no masses,  no organomegaly Extremities: extremities normal, atraumatic, no cyanosis or edema Neurologic: Alert and oriented X 3,  normal strength and tone. Normal symmetric reflexes. Normal coordination and gait  ECOG PERFORMANCE STATUS: 0 - Asymptomatic  Blood pressure 139/74, pulse 59, temperature 96.8 F (36 C), temperature source Oral, height 5\' 8"  (1.727 m), weight 206 lb 8 oz (93.668 kg).  LABORATORY DATA: Lab Results  Component Value Date   WBC 9.3 05/30/2011   HGB 15.1 05/30/2011   HCT 43.7 05/30/2011   MCV 93.3 05/30/2011   PLT 196 05/30/2011      Chemistry      Component Value Date/Time   NA 141 05/30/2011 1303   K 3.9 05/30/2011 1303   CL 100 05/30/2011 1303   CO2 30 05/30/2011 1303   BUN 18 05/30/2011 1303   CREATININE 1.26 05/30/2011 1303      Component Value Date/Time   CALCIUM 10.1 05/30/2011 1303   ALKPHOS 84  05/30/2011 1303   AST 17 05/30/2011 1303   ALT 15 05/30/2011 1303   BILITOT 1.0 05/30/2011 1303       RADIOGRAPHIC STUDIES: Dg Chest 2 View  06/05/2011  *RADIOLOGY REPORT*  Clinical Data: Preop for myeloma  CHEST - 2 VIEW  Comparison: None  Findings: Cardiomediastinal silhouette is unremarkable.  No acute infiltrate or pleural effusion.  No pulmonary edema.  Bilateral basilar probable nodular nipple shadow is noted.  Mild degenerative changes thoracic spine.  Mild elevation of the right hemidiaphragm.  IMPRESSION: No active disease.  Mild elevation of the right hemidiaphragm.  Original Report Authenticated By: Natasha Mead, M.D.   Ct Head W Wo Contrast  06/19/2011  *RADIOLOGY REPORT*  Clinical Data: Staging melanoma.  CT HEAD WITHOUT AND WITH CONTRAST  Technique:  Contiguous axial images were obtained from the base of the skull through the vertex without and with intravenous contrast.  Contrast:  100 ml Omnipaque-300.  Comparison: CT of the chest, abdomen and pelvis performed the same date dictated separately.  Findings: No intracranial hemorrhage.  No CT evidence of large acute infarct.  No evidence of intracranial mass or abnormal enhancement.  No hydrocephalus.  Moderate intracranial atherosclerotic type changes with calcified ectatic vasculature.  No bony destructive lesion.  IMPRESSION: No evidence of intracranial metastatic disease.  Original Report Authenticated By: Fuller Canada, M.D.   Ct Chest W Contrast  06/19/2011  *RADIOLOGY REPORT*  Clinical Data:  Melanoma removed from the patient's  back on 05/2011  CT CHEST, ABDOMEN AND PELVIS WITH CONTRAST  Technique:  Multidetector CT imaging of the chest, abdomen and pelvis was performed following the standard protocol during bolus administration of intravenous contrast.  Contrast:  100 ml Omnipaque 300  Comparison:   None.   CT CHEST  Findings:  No axillary or supraclavicular lymphadenopathy.  No mediastinal or hilar lymphadenopathy.  No pericardial  fluid or mass.  Esophagus is normal.  Review of the lung parenchyma demonstrates no suspicious pulmonary nodules.  Airways are normal.  There is no cutaneous lesion is noted within the thorax.  Small surgical defect in the mid upper back (image 4).  IMPRESSION: No evidence of thoracic metastasis.   CT ABDOMEN AND PELVIS  Findings:  There is a 25 mm low density lesion in the posterior right hepatic lobe (image 52).  There is suggestion of peripheral enhancement which could indicate a benign hemangioma.  This lesion is incompletely characterized.  Gallbladder, pancreas, spleen, and adrenal glands are normal. There is a simple cyst extending from the right kidney and a simple left cortical renal cyst.  The stomach, small bowel, and colon are  normal.  Abdominal aorta is normal caliber.  No retroperitoneal periportal lymphadenopathy.  No evidence of peritoneal implants or mesenteric implant.  No free fluid the pelvis.  The prostate gland is enlarged.  Bladder appears normal.  No pelvic lymphadenopathy. Review of  bone windows demonstrates no aggressive osseous lesions.  There is a 8 mm left external iliac lymph node (image 111).  There is a similar node on the right.  Small inguinal lymph nodes present. Review of  bone windows demonstrates no aggressive osseous lesions.  IMPRESSION:  1.  No clear evidence metastasis in the abdomen or pelvis.  2.  Indeterminate lesion within the right hepatic lobe.  Recommend MRI abdomen with contrast or FDG PET imaging for further characterization. 3.  Small inguinal lymph nodes and external and neck lymph nodes are likely benign.  Original Report Authenticated By: Genevive Bi, M.D.   ASSESSMENT: This is a very pleasant 78 years old white male with history of stage IIc malignant melanoma of the left upper arm status post wide excision but no sentinel lymph node biopsy. The patient has no evidence for metastatic disease on his recent scans. I discussed the scan results with the  patient and his wife.  PLAN: I still recommend for the patient sentinel lymph node biopsy. I discussed my recommendation with Dr. Magnus Ivan and he will contact the patient for followup and consideration of this procedure. I would see him back for followup visit in 6 months with repeat CBC, comprehensive metabolic panel, LDH and chest x-ray of the sentinel lymph nodes biopsy was negative, otherwise I would see the patient sooner for further recommendation if he has a positive lymph node biopsy. The patient was advised to call me immediately if he has any concerning symptoms in the interval.  All questions were answered. The patient knows to call the clinic with any problems, questions or concerns. We can certainly see the patient much sooner if necessary.

## 2011-06-20 NOTE — Telephone Encounter (Signed)
Gv pt appt for aug2013.  gv copy of CXR orders to pt to have done i aug2013 before appt

## 2011-11-16 ENCOUNTER — Other Ambulatory Visit: Payer: Self-pay | Admitting: Dermatology

## 2011-12-11 ENCOUNTER — Telehealth: Payer: Self-pay | Admitting: Internal Medicine

## 2011-12-11 NOTE — Telephone Encounter (Signed)
s.w pt and he is aware that the 8/29 appt has been moved to 9/4   aom

## 2011-12-20 ENCOUNTER — Other Ambulatory Visit: Payer: Medicare Other | Admitting: Lab

## 2011-12-20 ENCOUNTER — Ambulatory Visit: Payer: Medicare Other | Admitting: Internal Medicine

## 2011-12-26 ENCOUNTER — Other Ambulatory Visit (HOSPITAL_BASED_OUTPATIENT_CLINIC_OR_DEPARTMENT_OTHER): Payer: Medicare Other | Admitting: Lab

## 2011-12-26 ENCOUNTER — Ambulatory Visit (HOSPITAL_BASED_OUTPATIENT_CLINIC_OR_DEPARTMENT_OTHER): Payer: Medicare Other | Admitting: Internal Medicine

## 2011-12-26 ENCOUNTER — Telehealth: Payer: Self-pay | Admitting: Internal Medicine

## 2011-12-26 VITALS — BP 114/68 | HR 57 | Temp 97.3°F | Resp 20 | Ht 68.0 in | Wt 206.2 lb

## 2011-12-26 DIAGNOSIS — C439 Malignant melanoma of skin, unspecified: Secondary | ICD-10-CM

## 2011-12-26 LAB — COMPREHENSIVE METABOLIC PANEL (CC13)
ALT: 19 U/L (ref 0–55)
Albumin: 3.7 g/dL (ref 3.5–5.0)
CO2: 30 mEq/L — ABNORMAL HIGH (ref 22–29)
Glucose: 185 mg/dl — ABNORMAL HIGH (ref 70–99)
Potassium: 3.6 mEq/L (ref 3.5–5.1)
Sodium: 139 mEq/L (ref 136–145)
Total Protein: 6.3 g/dL — ABNORMAL LOW (ref 6.4–8.3)

## 2011-12-26 LAB — CBC WITH DIFFERENTIAL/PLATELET
BASO%: 0.2 % (ref 0.0–2.0)
EOS%: 2.5 % (ref 0.0–7.0)
HCT: 42.4 % (ref 38.4–49.9)
LYMPH%: 24.8 % (ref 14.0–49.0)
MCH: 31.4 pg (ref 27.2–33.4)
MCHC: 34.7 g/dL (ref 32.0–36.0)
MONO#: 0.6 10*3/uL (ref 0.1–0.9)
NEUT%: 65.8 % (ref 39.0–75.0)
Platelets: 173 10*3/uL (ref 140–400)
RBC: 4.68 10*6/uL (ref 4.20–5.82)
WBC: 8.3 10*3/uL (ref 4.0–10.3)
lymph#: 2.1 10*3/uL (ref 0.9–3.3)

## 2011-12-26 LAB — LACTATE DEHYDROGENASE (CC13): LDH: 149 U/L (ref 125–220)

## 2011-12-26 NOTE — Patient Instructions (Signed)
Your labwork is good today Followup visit in 6 months with repeat blood work and chest x-ray

## 2011-12-26 NOTE — Telephone Encounter (Signed)
Gave pt appt for march 2014 lab, x-ray and MD visit

## 2011-12-26 NOTE — Progress Notes (Signed)
[facility] Telephone:([phone]) [phone]   Fax:(336) [phone]  OFFICE PROGRESS NOTE  [name], MD [address] [place] [address]  DIAGNOSIS: Stage IIc (T4 B., NX, MX) malignant melanoma of the left arm diagnosed in February of 2013   PRIOR THERAPY: Status post wide excision of the malignant melanoma from the left upper arm as well as a melanoma in situ from the left back and right shoulder under the care of Dr. Magnus Ivan.   CURRENT THERAPY: None  INTERVAL HISTORY: Danny Waters 78 y.o. male returns to the clinic today for six-month followup visit. The patient is doing fine with no specific complaints. He was seen recently by his dermatologist Dr. Donzetta Starch and has a skin exam that showed no suspicious skin lesion. The patient denied having any significant weight loss or night sweats. He denied having any significant chest pain, shortness breath, cough or hemoptysis. He has repeat blood work including CBC, comprehensive metabolic panel and LDH recently and he is here for evaluation and discussion of his lab results.  MEDICAL HISTORY: Past Medical History  Diagnosis Date  . Hyperlipidemia     takes Lipitor daily  . Hypertension     takes Amlodipine,Metoprolol,and Losartan daily  . Heart attack 10+yrs ago  . Bruises easily   . Hx of colonic polyps   . Diabetes mellitus     type 2 but doesn't take any meds;diet and exercise controlled  . Cancer     melanoma    ALLERGIES:   has no known allergies.  MEDICATIONS:  Current Outpatient Prescriptions  Medication Sig Dispense Refill  . amLODipine (NORVASC) 5 MG tablet Take 5 mg by mouth daily.       Marland Kitchen aspirin 81 MG tablet Take 160 mg by mouth daily.      Marland Kitchen atorvastatin (LIPITOR) 40 MG tablet Take 40 mg by mouth daily.       . calcium carbonate (TUMS - DOSED IN MG ELEMENTAL CALCIUM) 500 MG chewable tablet Chew 1 tablet by mouth daily.      Marland Kitchen losartan-hydrochlorothiazide (HYZAAR) 100-25 MG  per tablet Take 1 tablet by mouth daily.       . metoprolol succinate (TOPROL-XL) 100 MG 24 hr tablet Take 100 mg by mouth daily.       . Multiple Vitamin (MULTIVITAMIN) tablet Take 1 tablet by mouth daily.      . mupirocin ointment (BACTROBAN) 2 %         SURGICAL HISTORY:  Past Surgical History  Procedure Date  . Malignanat melanoma 2003  . Hernia repair 1957  . Appendectomy   . Cardiac catheterization 10+yrs ago  . Coronary angioplasty     1 stent  . Colonoscopy   . Melanoma excision 06/07/2011    Procedure: MELANOMA EXCISION;  Surgeon: Shelly Rubenstein, MD;  Location: [facility] OR;  Service: General;  Laterality: Right;  wide excision melanoma right shoulder  and left back and left upper arm     REVIEW OF SYSTEMS:  A comprehensive review of systems was negative.   PHYSICAL EXAMINATION: General appearance: alert, cooperative and no distress Head: Normocephalic, without obvious abnormality, atraumatic Neck: no adenopathy Lymph nodes: Cervical, supraclavicular, and axillary nodes normal. Resp: clear to auscultation bilaterally Cardio: regular rate and rhythm, S1, S2 normal, no murmur, click, rub or gallop GI: soft, non-tender; bowel sounds normal; no masses,  no organomegaly Extremities: extremities normal, atraumatic, no cyanosis or edema  ECOG  PERFORMANCE STATUS: 0 - Asymptomatic  Blood pressure 114/68, pulse 57, temperature 97.3 F (36.3 C), temperature source Oral, resp. rate 20, height 5\' 8"  (1.727 m), weight 206 lb 3.2 oz (93.532 kg).  LABORATORY DATA: Lab Results  Component Value Date   WBC 8.3 12/26/2011   HGB 14.7 12/26/2011   HCT 42.4 12/26/2011   MCV 90.6 12/26/2011   PLT 173 12/26/2011      Chemistry      Component Value Date/Time   NA 139 12/26/2011 1112   NA 141 05/30/2011 1303   K 3.6 12/26/2011 1112   K 3.9 05/30/2011 1303   CL 99 12/26/2011 1112   CL 100 05/30/2011 1303   CO2 30* 12/26/2011 1112   CO2 30 05/30/2011 1303   BUN 19.0 12/26/2011 1112   BUN 18 05/30/2011 1303    CREATININE 1.2 12/26/2011 1112   CREATININE 1.26 05/30/2011 1303      Component Value Date/Time   CALCIUM 10.3 12/26/2011 1112   CALCIUM 10.1 05/30/2011 1303   ALKPHOS 96 12/26/2011 1112   ALKPHOS 84 05/30/2011 1303   AST 18 12/26/2011 1112   AST 17 05/30/2011 1303   ALT 19 12/26/2011 1112   ALT 15 05/30/2011 1303   BILITOT 1.20 12/26/2011 1112   BILITOT 1.0 05/30/2011 1303       RADIOGRAPHIC STUDIES: No results found.  ASSESSMENT: This is a very pleasant 78 years old white male with history of stage II C. malignant melanoma of the left arm status post wide excision but no sentinel lymph node biopsy was performed. The patient is currently on observation with no significant evidence for disease recurrence on the recent blood work as well as the previous skin exam.  PLAN: I recommended for him continuous observation for now. I would see him back for followup visit in 6 months with repeat CBC, comprehensive metabolic panel, LDH and chest x-ray. He was advised to call me immediately if he has any concerning symptoms in the interval.  All questions were answered. The patient knows to call the clinic with any problems, questions or concerns. We can certainly see the patient much sooner if necessary.

## 2012-06-24 ENCOUNTER — Inpatient Hospital Stay (HOSPITAL_COMMUNITY): Admission: RE | Admit: 2012-06-24 | Payer: Medicare Other | Source: Ambulatory Visit

## 2012-06-24 ENCOUNTER — Telehealth: Payer: Self-pay | Admitting: Internal Medicine

## 2012-06-24 ENCOUNTER — Other Ambulatory Visit: Payer: Medicare Other | Admitting: Lab

## 2012-06-24 NOTE — Telephone Encounter (Signed)
pt alled and did not want to comeout in the weather today...wanted all his appts tomorrow...pt aware that he needs to go to radiology b4 lab...S.w Leigh to r/s the xray to tomorrow

## 2012-06-25 ENCOUNTER — Ambulatory Visit (HOSPITAL_COMMUNITY)
Admission: RE | Admit: 2012-06-25 | Discharge: 2012-06-25 | Disposition: A | Discharge status: Home / Self Care | Payer: Medicare Other | Source: Ambulatory Visit | Attending: Internal Medicine | Admitting: Internal Medicine

## 2012-06-25 ENCOUNTER — Other Ambulatory Visit: Payer: Self-pay | Admitting: *Deleted

## 2012-06-25 ENCOUNTER — Ambulatory Visit (HOSPITAL_BASED_OUTPATIENT_CLINIC_OR_DEPARTMENT_OTHER): Payer: Medicare Other | Admitting: Internal Medicine

## 2012-06-25 ENCOUNTER — Other Ambulatory Visit (HOSPITAL_BASED_OUTPATIENT_CLINIC_OR_DEPARTMENT_OTHER): Payer: Medicare Other | Admitting: Lab

## 2012-06-25 ENCOUNTER — Encounter: Payer: Self-pay | Admitting: Internal Medicine

## 2012-06-25 VITALS — BP 123/69 | HR 80 | Temp 98.0°F | Resp 18 | Ht 68.0 in | Wt 196.4 lb

## 2012-06-25 DIAGNOSIS — C439 Malignant melanoma of skin, unspecified: Secondary | ICD-10-CM

## 2012-06-25 DIAGNOSIS — C4360 Malignant melanoma of unspecified upper limb, including shoulder: Secondary | ICD-10-CM

## 2012-06-25 DIAGNOSIS — Z85820 Personal history of malignant melanoma of skin: Secondary | ICD-10-CM | POA: Insufficient documentation

## 2012-06-25 LAB — CBC WITH DIFFERENTIAL/PLATELET
Basophils Absolute: 0.1 10*3/uL (ref 0.0–0.1)
Eosinophils Absolute: 0.1 10*3/uL (ref 0.0–0.5)
HGB: 16.2 g/dL (ref 13.0–17.1)
NEUT#: 7.4 10*3/uL — ABNORMAL HIGH (ref 1.5–6.5)
RBC: 5.05 10*6/uL (ref 4.20–5.82)
RDW: 13.5 % (ref 11.0–14.6)
WBC: 10.4 10*3/uL — ABNORMAL HIGH (ref 4.0–10.3)
lymph#: 2.3 10*3/uL (ref 0.9–3.3)

## 2012-06-25 LAB — COMPREHENSIVE METABOLIC PANEL (CC13)
Albumin: 3.8 g/dL (ref 3.5–5.0)
BUN: 16.9 mg/dL (ref 7.0–26.0)
CO2: 35 mEq/L — ABNORMAL HIGH (ref 22–29)
Calcium: 10.8 mg/dL — ABNORMAL HIGH (ref 8.4–10.4)
Chloride: 94 mEq/L — ABNORMAL LOW (ref 98–107)
Glucose: 399 mg/dl — ABNORMAL HIGH (ref 70–99)
Potassium: 3.5 mEq/L (ref 3.5–5.1)

## 2012-06-25 NOTE — Patient Instructions (Signed)
No significant abnormalities in your blood work today Followup in 6 months

## 2012-06-25 NOTE — Telephone Encounter (Signed)
Gave pt appt for lab and MD on August 2014 °

## 2012-06-25 NOTE — Progress Notes (Signed)
[facility] Telephone:(336) [phone]   Fax:(336) [phone]  OFFICE PROGRESS NOTE  [name], MD [address] [place] [address]  DIAGNOSIS: Stage IIc (T4 B., NX, MX) malignant melanoma of the left arm diagnosed in February of 2013   PRIOR THERAPY: Status post wide excision of the malignant melanoma from the left upper arm as well as a melanoma in situ from the left back and right shoulder under the care of Dr. Magnus Ivan.   CURRENT THERAPY: None  INTERVAL HISTORY: Danny Waters 79 y.o. male returns to the clinic today for routine six-month followup visit. The patient is feeling fine today with no specific complaints. He denied having any significant chest pain, shortness of breath, cough or hemoptysis. He intentionally lost more than 20 pounds over the last few months. He had CBC and comprehensive metabolic panel performed earlier today and he is here for evaluation and discussion of his lab results.  MEDICAL HISTORY: Past Medical History  Diagnosis Date  . Hyperlipidemia     takes Lipitor daily  . Hypertension     takes Amlodipine,Metoprolol,and Losartan daily  . Heart attack 10+yrs ago  . Bruises easily   . Hx of colonic polyps   . Diabetes mellitus     type 2 but doesn't take any meds;diet and exercise controlled  . Cancer     melanoma    ALLERGIES:  has No Known Allergies.  MEDICATIONS:  Current Outpatient Prescriptions  Medication Sig Dispense Refill  . amLODipine (NORVASC) 5 MG tablet Take 5 mg by mouth daily.       Marland Kitchen aspirin 81 MG tablet Take 160 mg by mouth daily.      Marland Kitchen atorvastatin (LIPITOR) 40 MG tablet Take 40 mg by mouth daily.       . calcium carbonate (TUMS - DOSED IN MG ELEMENTAL CALCIUM) 500 MG chewable tablet Chew 1 tablet by mouth daily.      Marland Kitchen losartan-hydrochlorothiazide (HYZAAR) 100-25 MG per tablet Take 1 tablet by mouth daily.       . metoprolol succinate (TOPROL-XL) 100 MG 24 hr tablet Take 100 mg by  mouth daily.       . Multiple Vitamin (MULTIVITAMIN) tablet Take 1 tablet by mouth daily.      . mupirocin ointment (BACTROBAN) 2 %        No current facility-administered medications for this visit.    SURGICAL HISTORY:  Past Surgical History  Procedure Laterality Date  . Malignanat melanoma  2003  . Hernia repair  1957  . Appendectomy    . Cardiac catheterization  10+yrs ago  . Coronary angioplasty      1 stent  . Colonoscopy    . Melanoma excision  06/07/2011    Procedure: MELANOMA EXCISION;  Surgeon: Shelly Rubenstein, MD;  Location: [facility] OR;  Service: General;  Laterality: Right;  wide excision melanoma right shoulder  and left back and left upper arm     REVIEW OF SYSTEMS:  A comprehensive review of systems was negative.   PHYSICAL EXAMINATION: General appearance: alert, cooperative and no distress Head: Normocephalic, without obvious abnormality, atraumatic Neck: no adenopathy Lymph nodes: Cervical, supraclavicular, and axillary nodes normal. Resp: clear to auscultation bilaterally Cardio: regular rate and rhythm, S1, S2 normal, no murmur, click, rub or gallop GI: soft, non-tender; bowel sounds normal; no masses,  no organomegaly Extremities: extremities normal, atraumatic, no cyanosis or edema Skin exam showed no suspicious lesion  for melanoma.  ECOG PERFORMANCE STATUS: 1 - Symptomatic but completely ambulatory  Blood pressure 123/69, pulse 80, temperature 98 F (36.7 C), temperature source Oral, resp. rate 18, height 5\' 8"  (1.727 m), weight 196 lb 6.4 oz (89.086 kg).  LABORATORY DATA: Lab Results  Component Value Date   WBC 10.4* 06/25/2012   HGB 16.2 06/25/2012   HCT 46.6 06/25/2012   MCV 92.4 06/25/2012   PLT 173 06/25/2012      Chemistry      Component Value Date/Time   NA 139 12/26/2011 1112   NA 141 05/30/2011 1303   K 3.6 12/26/2011 1112   K 3.9 05/30/2011 1303   CL 99 12/26/2011 1112   CL 100 05/30/2011 1303   CO2 30* 12/26/2011 1112   CO2 30 05/30/2011 1303   BUN 19.0  12/26/2011 1112   BUN 18 05/30/2011 1303   CREATININE 1.2 12/26/2011 1112   CREATININE 1.26 05/30/2011 1303      Component Value Date/Time   CALCIUM 10.3 12/26/2011 1112   CALCIUM 10.1 05/30/2011 1303   ALKPHOS 96 12/26/2011 1112   ALKPHOS 84 05/30/2011 1303   AST 18 12/26/2011 1112   AST 17 05/30/2011 1303   ALT 19 12/26/2011 1112   ALT 15 05/30/2011 1303   BILITOT 1.20 12/26/2011 1112   BILITOT 1.0 05/30/2011 1303       RADIOGRAPHIC STUDIES: Dg Abd 1 View  06/25/2012  *RADIOLOGY REPORT*  Clinical Data: History of melanoma.  ABDOMEN - 1 VIEW  Comparison: No priors.  Findings: Gas and stool are seen scattered throughout the colon extending to the level of the distal rectum.  No pathologic distension of small bowel is noted.  No gross evidence of pneumoperitoneum.  There is a relatively large volume of stool throughout the colon and rectum.  IMPRESSION: 1.  Nonobstructive bowel gas pattern. 2.  No pneumoperitoneum. 3.  Relatively large volume of stool throughout the colon and rectum may suggest constipation.   Original Report Authenticated By: Trudie Reed, M.D.     ASSESSMENT: This is a very pleasant 79 years old white male with history of stage IIc malignant melanoma of the left arm status post wide excision and has been on observation with no evidence for disease recurrence.  PLAN: I discussed the lab result with the patient today. I recommended for him to continue on observation with repeat CBC, comprehensive metabolic panel and LDH in 6 months. He was advised to call immediately she has any concerning symptoms in the interval. The patient was also advised to keep his appointment with his dermatologist Dr. Donzetta Starch at regular basis.  All questions were answered. The patient knows to call the clinic with any problems, questions or concerns. We can certainly see the patient much sooner if necessary.

## 2012-09-24 ENCOUNTER — Encounter: Payer: Self-pay | Admitting: Cardiology

## 2012-09-24 DIAGNOSIS — E785 Hyperlipidemia, unspecified: Secondary | ICD-10-CM | POA: Insufficient documentation

## 2012-09-24 DIAGNOSIS — E1159 Type 2 diabetes mellitus with other circulatory complications: Secondary | ICD-10-CM | POA: Insufficient documentation

## 2012-09-24 DIAGNOSIS — I2510 Atherosclerotic heart disease of native coronary artery without angina pectoris: Secondary | ICD-10-CM

## 2012-09-24 DIAGNOSIS — I350 Nonrheumatic aortic (valve) stenosis: Secondary | ICD-10-CM | POA: Insufficient documentation

## 2012-09-24 DIAGNOSIS — I119 Hypertensive heart disease without heart failure: Secondary | ICD-10-CM | POA: Insufficient documentation

## 2012-09-24 HISTORY — DX: Atherosclerotic heart disease of native coronary artery without angina pectoris: I25.10

## 2012-12-29 ENCOUNTER — Ambulatory Visit (HOSPITAL_BASED_OUTPATIENT_CLINIC_OR_DEPARTMENT_OTHER): Payer: Medicare Other | Admitting: Physician Assistant

## 2012-12-29 ENCOUNTER — Telehealth: Payer: Self-pay | Admitting: Internal Medicine

## 2012-12-29 ENCOUNTER — Other Ambulatory Visit: Payer: Self-pay | Admitting: Physician Assistant

## 2012-12-29 ENCOUNTER — Other Ambulatory Visit (HOSPITAL_BASED_OUTPATIENT_CLINIC_OR_DEPARTMENT_OTHER): Payer: Medicare Other | Admitting: Lab

## 2012-12-29 ENCOUNTER — Encounter: Payer: Self-pay | Admitting: Physician Assistant

## 2012-12-29 VITALS — BP 136/77 | HR 66 | Temp 97.9°F | Resp 20 | Ht 68.0 in | Wt 195.3 lb

## 2012-12-29 DIAGNOSIS — C439 Malignant melanoma of skin, unspecified: Secondary | ICD-10-CM

## 2012-12-29 DIAGNOSIS — L989 Disorder of the skin and subcutaneous tissue, unspecified: Secondary | ICD-10-CM

## 2012-12-29 DIAGNOSIS — C4360 Malignant melanoma of unspecified upper limb, including shoulder: Secondary | ICD-10-CM

## 2012-12-29 LAB — CBC WITH DIFFERENTIAL/PLATELET
BASO%: 0.4 % (ref 0.0–2.0)
Eosinophils Absolute: 0.1 10*3/uL (ref 0.0–0.5)
HCT: 45.9 % (ref 38.4–49.9)
LYMPH%: 20.5 % (ref 14.0–49.0)
MCHC: 33.8 g/dL (ref 32.0–36.0)
MONO#: 0.5 10*3/uL (ref 0.1–0.9)
NEUT%: 71.7 % (ref 39.0–75.0)
Platelets: 135 10*3/uL — ABNORMAL LOW (ref 140–400)
WBC: 9.2 10*3/uL (ref 4.0–10.3)

## 2012-12-29 LAB — COMPREHENSIVE METABOLIC PANEL (CC13)
BUN: 17 mg/dL (ref 7.0–26.0)
CO2: 31 mEq/L — ABNORMAL HIGH (ref 22–29)
Creatinine: 1.1 mg/dL (ref 0.7–1.3)
Glucose: 358 mg/dl — ABNORMAL HIGH (ref 70–140)
Total Bilirubin: 0.89 mg/dL (ref 0.20–1.20)

## 2012-12-29 LAB — LACTATE DEHYDROGENASE (CC13): LDH: 210 U/L (ref 125–245)

## 2012-12-29 MED ORDER — CEPHALEXIN 500 MG PO CAPS: 500.0000 mg | ORAL_CAPSULE | Freq: Three times a day (TID) | ORAL | Status: DC

## 2012-12-29 NOTE — Telephone Encounter (Signed)
gv and printed appt sched and avs forpt for March 2015  °

## 2012-12-30 NOTE — Progress Notes (Addendum)
[facility] [phone]   [phone]  SHARED VISIT PROGRESS NOTE  [name], MD [address] [place] [address]  DIAGNOSIS: Stage IIc (T4 B., NX, MX) malignant melanoma of the left arm diagnosed in February of 2013   PRIOR THERAPY: Status post wide excision of the malignant melanoma from the left upper arm as well as a melanoma in situ from the left back and right shoulder under the care of Dr. Magnus Ivan.   CURRENT THERAPY: None  INTERVAL HISTORY: Danny Waters 79 y.o. male returns to the clinic today for routine six-month followup visit. The patient is feeling fine today with no specific complaints. He denied having any significant chest pain, shortness of breath, cough or hemoptysis. He noticed a lesion on his left shin about 2-3 days ago. It doesn't seem to be healing well. He has been applying Bactroban ointment and covering it with a Band-Aid. He does not remember any specific, trauma to this area.  He had CBC and comprehensive metabolic panel performed earlier today and he is here for evaluation and discussion of his lab results.  MEDICAL HISTORY: Past Medical History  Diagnosis Date  . Hyperlipidemia     takes Lipitor daily  . Hypertension     takes Amlodipine,Metoprolol,and Losartan daily  . Hx of colonic polyps   . Type 2 diabetes mellitus with vascular disease   . Aortic stenosis     Last ECHO Dr. Donnie Aho 09/24/12  AVA 0.76 assymptomatic   . CAD (coronary artery disease) 09/24/2012    Cath 07/04/2002 normal Left main, 50% stenosis proximal LAD, 99% stenosis mid LAD, 50% stenosis mid CFX, 60% stenosis proximal PDA, stent placed mid LAD  Taxus stent to mid LAD 07/04/2002   . Hypertensive heart disease     ALLERGIES:  has No Known Allergies.  MEDICATIONS:  Current Outpatient Prescriptions  Medication Sig Dispense Refill  . amLODipine (NORVASC) 5 MG tablet Take 5 mg by mouth daily.       Marland Kitchen aspirin 81 MG  tablet Take 160 mg by mouth daily.      Marland Kitchen atorvastatin (LIPITOR) 40 MG tablet Take 40 mg by mouth daily.       . calcium carbonate (TUMS - DOSED IN MG ELEMENTAL CALCIUM) 500 MG chewable tablet Chew 1 tablet by mouth daily.      . cephALEXin (KEFLEX) 500 MG capsule Take 1 capsule (500 mg total) by mouth 3 (three) times daily.  21 capsule  0  . losartan-hydrochlorothiazide (HYZAAR) 100-25 MG per tablet Take 1 tablet by mouth daily.       . metoprolol succinate (TOPROL-XL) 100 MG 24 hr tablet Take 100 mg by mouth daily.       . Multiple Vitamin (MULTIVITAMIN) tablet Take 1 tablet by mouth daily.      . mupirocin ointment (BACTROBAN) 2 %        No current facility-administered medications for this visit.    SURGICAL HISTORY:  Past Surgical History  Procedure Laterality Date  . Malignanat melanoma  2003  . Hernia repair  1957  . Appendectomy    . Cardiac catheterization  10+yrs ago  . Coronary stent placement  2004    Taxus stent to mid LAD  . Colonoscopy    . Melanoma excision  06/07/2011    Procedure: MELANOMA EXCISION;  Surgeon: Shelly Rubenstein, MD;  Location: [facility] OR;  Service: General;  Laterality: Right;  wide  excision melanoma right shoulder  and left back and left upper arm   . Mastoid debridement      REVIEW OF SYSTEMS:  A comprehensive review of systems was negative.   PHYSICAL EXAMINATION: General appearance: alert, cooperative and no distress Head: Normocephalic, without obvious abnormality, atraumatic Neck: no adenopathy Lymph nodes: Cervical, supraclavicular, and axillary nodes normal. Resp: clear to auscultation bilaterally Cardio: regular rate and rhythm, S1, S2 normal, no murmur, click, rub or gallop GI: soft, non-tender; bowel sounds normal; no masses,  no organomegaly Extremities: extremities normal, atraumatic, no cyanosis or edema Skin exam showed no suspicious lesion for melanoma. However on the left shin there is an approximately 1 cm lesion that is  approximately 4 mm raised with the hearing tomography and a little color variation. This could be from some maceration from it being covered with the ointment on it. There is no active bleeding  ECOG PERFORMANCE STATUS: 1 - Symptomatic but completely ambulatory  Blood pressure 136/77, pulse 66, temperature 97.9 F (36.6 C), temperature source Oral, resp. rate 20, height 5\' 8"  (1.727 m), weight 195 lb 4.8 oz (88.587 kg).  LABORATORY DATA: Lab Results  Component Value Date   WBC 9.2 12/29/2012   HGB 15.5 12/29/2012   HCT 45.9 12/29/2012   MCV 92.3 12/29/2012   PLT 135* 12/29/2012      Chemistry      Component Value Date/Time   NA 138 12/29/2012 1003   NA 141 05/30/2011 1303   K 3.8 12/29/2012 1003   K 3.9 05/30/2011 1303   CL 94* 06/25/2012 1008   CL 100 05/30/2011 1303   CO2 31* 12/29/2012 1003   CO2 30 05/30/2011 1303   BUN 17.0 12/29/2012 1003   BUN 18 05/30/2011 1303   CREATININE 1.1 12/29/2012 1003   CREATININE 1.26 05/30/2011 1303      Component Value Date/Time   CALCIUM 10.9* 12/29/2012 1003   CALCIUM 10.1 05/30/2011 1303   ALKPHOS 121 12/29/2012 1003   ALKPHOS 84 05/30/2011 1303   AST 17 12/29/2012 1003   AST 17 05/30/2011 1303   ALT 21 12/29/2012 1003   ALT 15 05/30/2011 1303   BILITOT 0.89 12/29/2012 1003   BILITOT 1.0 05/30/2011 1303       RADIOGRAPHIC STUDIES: Dg Abd 1 View  06/25/2012  *RADIOLOGY REPORT*  Clinical Data: History of melanoma.  ABDOMEN - 1 VIEW  Comparison: No priors.  Findings: Gas and stool are seen scattered throughout the colon extending to the level of the distal rectum.  No pathologic distension of small bowel is noted.  No gross evidence of pneumoperitoneum.  There is a relatively large volume of stool throughout the colon and rectum.  IMPRESSION: 1.  Nonobstructive bowel gas pattern. 2.  No pneumoperitoneum. 3.  Relatively large volume of stool throughout the colon and rectum may suggest constipation.   Original Report Authenticated By: Trudie Reed, M.D.     ASSESSMENT: This is a  very pleasant 79 years old white male with history of stage IIc malignant melanoma of the left arm status post wide excision and has been on observation with no evidence for disease recurrence.  PLAN: Patient's lab results were discussed with him today his glucose is elevated at 358 and his calcium is slightly elevated at 10.9. His primary care physician as Dr. Viann Fish. The patient was discussed with them also seen by Dr. Arbutus Ped. The lesion on his left shin is concerning giving his past history of malignant melanoma. We will  place the patient on a seven-day course of Keflex at 500 mg 3 times daily. He was instructed that should this course of antibiotics not clear up the lesion he is to make an appointment with his dermatologist, Dr. Donzetta Starch for further evaluation including biopsy of this lesion. Should this be another melanoma and he requires further treatment by [place] we will see him sooner. Otherwise he will followup in 6 months with repeat CBC differential, C. met LDH and a chest x-ray. These studies will be done to 3 days prior to his followup visits of the results are available for review.   Laural Benes, Ziara Thelander E, PA-C   All questions were answered. The patient knows to call the clinic with any problems, questions or concerns. We can certainly see the patient much sooner if necessary.  ADDENDUM: Hematology/Oncology Attending: I have a face to face encounter with the patient. I recommended his care plan. The patient has a history of stage IIc malignant melanoma status post wide excision with no adjuvant treatment. He is feeling fine today and blood work is unremarkable for any concerning findings except for the hyperglycemia. The patient has a skin lesion on the left shin that was getting worse and inflamed over the last few days. I will start the patient on a course of Keflex 500 mg by mouth 3 times a day. The patient was advised to contact his dermatologist for evaluation and biopsy if no  improvement in this area by next week. I would see him back for followup visit in 6 months with repeat CBC, comprehensive metabolic panel, LDH and chest x-ray. Lajuana Matte., MD 12/30/2012

## 2012-12-30 NOTE — Patient Instructions (Addendum)
Take a course of antibiotics as prescribed If the skin lesion on your left shin does not completely heal F. date the antibiotics, make an appointment with your dermatologist Dr. Donzetta Starch as instructed Followup with Dr. Arbutus Ped in 6 months

## 2013-03-28 ENCOUNTER — Encounter (HOSPITAL_COMMUNITY): Payer: Self-pay | Admitting: Emergency Medicine

## 2013-03-28 ENCOUNTER — Inpatient Hospital Stay (HOSPITAL_COMMUNITY)
Admission: EM | Admit: 2013-03-28 | Discharge: 2013-03-30 | DRG: 637 | Disposition: A | Discharge status: Home Health | Payer: Medicare Other | Attending: Internal Medicine | Admitting: Internal Medicine

## 2013-03-28 ENCOUNTER — Emergency Department (HOSPITAL_COMMUNITY): Payer: Medicare Other

## 2013-03-28 DIAGNOSIS — E119 Type 2 diabetes mellitus without complications: Secondary | ICD-10-CM

## 2013-03-28 DIAGNOSIS — Z9861 Coronary angioplasty status: Secondary | ICD-10-CM

## 2013-03-28 DIAGNOSIS — E1100 Type 2 diabetes mellitus with hyperosmolarity without nonketotic hyperglycemic-hyperosmolar coma (NKHHC): Principal | ICD-10-CM | POA: Diagnosis present

## 2013-03-28 DIAGNOSIS — E1159 Type 2 diabetes mellitus with other circulatory complications: Secondary | ICD-10-CM | POA: Diagnosis present

## 2013-03-28 DIAGNOSIS — E1165 Type 2 diabetes mellitus with hyperglycemia: Secondary | ICD-10-CM | POA: Diagnosis present

## 2013-03-28 DIAGNOSIS — I999 Unspecified disorder of circulatory system: Secondary | ICD-10-CM | POA: Diagnosis present

## 2013-03-28 DIAGNOSIS — Z7982 Long term (current) use of aspirin: Secondary | ICD-10-CM

## 2013-03-28 DIAGNOSIS — I119 Hypertensive heart disease without heart failure: Secondary | ICD-10-CM | POA: Diagnosis present

## 2013-03-28 DIAGNOSIS — I10 Essential (primary) hypertension: Secondary | ICD-10-CM | POA: Diagnosis present

## 2013-03-28 DIAGNOSIS — K8689 Other specified diseases of pancreas: Secondary | ICD-10-CM

## 2013-03-28 DIAGNOSIS — R739 Hyperglycemia, unspecified: Secondary | ICD-10-CM | POA: Diagnosis present

## 2013-03-28 DIAGNOSIS — I2510 Atherosclerotic heart disease of native coronary artery without angina pectoris: Secondary | ICD-10-CM

## 2013-03-28 DIAGNOSIS — C439 Malignant melanoma of skin, unspecified: Secondary | ICD-10-CM

## 2013-03-28 DIAGNOSIS — Z87891 Personal history of nicotine dependence: Secondary | ICD-10-CM

## 2013-03-28 DIAGNOSIS — E785 Hyperlipidemia, unspecified: Secondary | ICD-10-CM | POA: Diagnosis present

## 2013-03-28 DIAGNOSIS — IMO0002 Reserved for concepts with insufficient information to code with codable children: Secondary | ICD-10-CM | POA: Diagnosis present

## 2013-03-28 DIAGNOSIS — G9341 Metabolic encephalopathy: Secondary | ICD-10-CM | POA: Diagnosis present

## 2013-03-28 DIAGNOSIS — C252 Malignant neoplasm of tail of pancreas: Secondary | ICD-10-CM | POA: Diagnosis present

## 2013-03-28 DIAGNOSIS — C7970 Secondary malignant neoplasm of unspecified adrenal gland: Secondary | ICD-10-CM | POA: Diagnosis present

## 2013-03-28 DIAGNOSIS — I359 Nonrheumatic aortic valve disorder, unspecified: Secondary | ICD-10-CM | POA: Diagnosis present

## 2013-03-28 DIAGNOSIS — Z79899 Other long term (current) drug therapy: Secondary | ICD-10-CM

## 2013-03-28 DIAGNOSIS — Z23 Encounter for immunization: Secondary | ICD-10-CM

## 2013-03-28 DIAGNOSIS — C787 Secondary malignant neoplasm of liver and intrahepatic bile duct: Secondary | ICD-10-CM | POA: Diagnosis present

## 2013-03-28 DIAGNOSIS — Z85820 Personal history of malignant melanoma of skin: Secondary | ICD-10-CM

## 2013-03-28 LAB — COMPREHENSIVE METABOLIC PANEL
ALT: 61 U/L — ABNORMAL HIGH (ref 0–53)
AST: 46 U/L — ABNORMAL HIGH (ref 0–37)
Alkaline Phosphatase: 351 U/L — ABNORMAL HIGH (ref 39–117)
BUN: 19 mg/dL (ref 6–23)
CO2: 31 mEq/L (ref 19–32)
GFR calc Af Amer: 87 mL/min — ABNORMAL LOW (ref >90)
GFR calc non Af Amer: 75 mL/min — ABNORMAL LOW (ref >90)
Glucose, Bld: 524 mg/dL — ABNORMAL HIGH (ref 70–99)
Potassium: 3.5 mEq/L (ref 3.5–5.1)
Sodium: 129 mEq/L — ABNORMAL LOW (ref 135–145)
Total Protein: 6.9 g/dL (ref 6.0–8.3)

## 2013-03-28 LAB — POCT I-STAT, CHEM 8
Calcium, Ion: 1.25 mmol/L (ref 1.13–1.30)
Chloride: 91 mEq/L — ABNORMAL LOW (ref 96–112)
Glucose, Bld: 566 mg/dL (ref 70–99)
HCT: 48 % (ref 39.0–52.0)
Hemoglobin: 16.3 g/dL (ref 13.0–17.0)

## 2013-03-28 LAB — CBC WITH DIFFERENTIAL/PLATELET
Basophils Absolute: 0 10*3/uL (ref 0.0–0.1)
Basophils Relative: 0 % (ref 0–1)
Eosinophils Absolute: 0.1 10*3/uL (ref 0.0–0.7)
Eosinophils Relative: 1 % (ref 0–5)
HCT: 44.9 % (ref 39.0–52.0)
MCH: 31.7 pg (ref 26.0–34.0)
MCHC: 34.5 g/dL (ref 30.0–36.0)
MCV: 91.8 fL (ref 78.0–100.0)
Monocytes Absolute: 0.8 10*3/uL (ref 0.1–1.0)
RDW: 13.7 % (ref 11.5–15.5)

## 2013-03-28 LAB — URINALYSIS, ROUTINE W REFLEX MICROSCOPIC
Bilirubin Urine: NEGATIVE
Ketones, ur: NEGATIVE mg/dL
Leukocytes, UA: NEGATIVE
Nitrite: NEGATIVE
Protein, ur: NEGATIVE mg/dL
Urobilinogen, UA: 1 mg/dL (ref 0.0–1.0)

## 2013-03-28 LAB — AMMONIA: Ammonia: 28 umol/L (ref 11–60)

## 2013-03-28 MED ORDER — SODIUM CHLORIDE 0.9 % IV SOLN: INTRAVENOUS | Status: DC

## 2013-03-28 MED ORDER — SODIUM CHLORIDE 0.9 % IV SOLN
INTRAVENOUS | Status: DC
  Administered 2013-03-28: 3.7 [IU]/h via INTRAVENOUS
  Filled 2013-03-28: qty 1

## 2013-03-28 MED ORDER — SODIUM CHLORIDE 0.9 % IV BOLUS (SEPSIS)
500.0000 mL | Freq: Once | INTRAVENOUS | Status: AC
  Administered 2013-03-28: 500 mL via INTRAVENOUS

## 2013-03-28 NOTE — H&P (Addendum)
PCP:  [name], MD    Chief Complaint:  confusion  HPI: Danny Waters is a 79 y.o. male   has a past medical history of Hyperlipidemia; Hypertension; colonic polyps; Type 2 diabetes mellitus with vascular disease; Aortic stenosis; CAD (coronary artery disease) (09/24/2012); and Hypertensive heart disease.   Presented with  Patient was found driving in the wrong direction. He was brought to ER and was noted to have blood sugar in 600's. He was started on insulin gtt and hospitalist called for admission. He has hx of diet controlled DM2.  He states he has no PCP and no one has been following his blood sugars. Since he has been on insulin his mental status have improved. Denies alcohol abuse. LFT's were noted to slightly elevated.  He has hx of melanoma that has been excised.  Denies any chest pain or shortness of breath. He has been urinating somewhat more than usual. He has had some blurred vision. Denies fatigue. He has lost 40 lb over past year intentionally. He has been eating more vegetables an has cut out sweets.    Review of Systems:    Pertinent positives include: confusion, increased urination blurred vision  Constitutional:  No weight loss, night sweats, Fevers, chills, fatigue, weight loss  HEENT:  No headaches, Difficulty swallowing,Tooth/dental problems,Sore throat,  No sneezing, itching, ear ache, nasal congestion, post nasal drip,  Cardio-vascular:  No chest pain, Orthopnea, PND, anasarca, dizziness, palpitations.no Bilateral lower extremity swelling  GI:  No heartburn, indigestion, abdominal pain, nausea, vomiting, diarrhea, change in bowel habits, loss of appetite, melena, blood in stool, hematemesis Resp:  no shortness of breath at rest. No dyspnea on exertion, No excess mucus, no productive cough, No non-productive cough, No coughing up of blood.No change in color of mucus.No wheezing. Skin:  no rash or lesions. No jaundice GU:  no dysuria, change in  color of urine, no urgency or frequency. No straining to urinate.  No flank pain.  Musculoskeletal:  No joint pain or no joint swelling. No decreased range of motion. No back pain.  Psych:  No change in mood or affect. No depression or anxiety. No memory loss.  Neuro: no localizing neurological complaints, no tingling, no weakness, no double vision, no gait abnormality, no slurred speech, no   Otherwise ROS are negative except for above, 10 systems were reviewed  Past Medical History: Past Medical History  Diagnosis Date  . Hyperlipidemia     takes Lipitor daily  . Hypertension     takes Amlodipine,Metoprolol,and Losartan daily  . Hx of colonic polyps   . Type 2 diabetes mellitus with vascular disease   . Aortic stenosis     Last ECHO Dr. Donnie Aho 09/24/12  AVA 0.76 assymptomatic   . CAD (coronary artery disease) 09/24/2012    Cath 07/04/2002 normal Left main, 50% stenosis proximal LAD, 99% stenosis mid LAD, 50% stenosis mid CFX, 60% stenosis proximal PDA, stent placed mid LAD  Taxus stent to mid LAD 07/04/2002   . Hypertensive heart disease    Past Surgical History  Procedure Laterality Date  . Malignanat melanoma  2003  . Hernia repair  1957  . Appendectomy    . Cardiac catheterization  10+yrs ago  . Coronary stent placement  2004    Taxus stent to mid LAD  . Colonoscopy    . Melanoma excision  06/07/2011    Procedure: MELANOMA EXCISION;  Surgeon: Shelly Rubenstein, MD;  Location: [facility] OR;  Service: General;  Laterality: Right;  wide excision melanoma right shoulder  and left back and left upper arm   . Mastoid debridement       Medications: Prior to Admission medications   Medication Sig Start Date End Date Taking? Authorizing Provider  amLODipine (NORVASC) 5 MG tablet Take 5 mg by mouth daily.  05/17/11  Yes Historical Provider, MD  aspirin 81 MG tablet Take 81 mg by mouth daily.    Yes Historical Provider, MD  atorvastatin (LIPITOR) 40 MG tablet Take 40 mg by mouth daily.   05/22/11  Yes Historical Provider, MD  calcium carbonate (TUMS - DOSED IN MG ELEMENTAL CALCIUM) 500 MG chewable tablet Chew 1 tablet by mouth daily.   Yes Historical Provider, MD  losartan-hydrochlorothiazide (HYZAAR) 100-25 MG per tablet Take 1 tablet by mouth daily.  05/17/11  Yes Historical Provider, MD  metoprolol succinate (TOPROL-XL) 100 MG 24 hr tablet Take 100 mg by mouth daily.  05/17/11  Yes Historical Provider, MD  Multiple Vitamin (MULTIVITAMIN) tablet Take 1 tablet by mouth daily.   Yes Historical Provider, MD  mupirocin ointment (BACTROBAN) 2 %  06/05/11  Yes Historical Provider, MD    Allergies:  No Known Allergies  Social History:  Ambulatory   independently   Lives at home alone   reports that he quit smoking about 54 years ago. He has never used smokeless tobacco. He reports that he does not drink alcohol or use illicit drugs.   Family History: family history includes Heart disease in his mother; Hypertension in his father and mother. There is no history of Anesthesia problems, Hypotension, Malignant hyperthermia, or Pseudochol deficiency.    Physical Exam: Patient Vitals for the past 24 hrs:  BP Temp Temp src Pulse Resp SpO2 Height Weight  03/28/13 2012 112/69 mmHg 98.1 F (36.7 C) Oral 87 16 93 % 5\' 8"  (1.727 m) 86.183 kg (190 lb)    1. General:  in No Acute distress 2. Psychological: Alert and  Oriented 3. Head/ENT:     Dry Mucous Membranes                          Head Non traumatic, neck supple                          Normal   Dentition 4. SKIN:  decreased Skin turgor,  Skin clean Dry and intact no rash 5. Heart: Regular rate and rhythm loud systolic Murmur present, no Rub or gallop 6. Lungs: Clear to auscultation bilaterally, no wheezes or crackles   7. Abdomen: Soft, non-tender, Non distended 8. Lower extremities: no clubbing, cyanosis, or edema 9. Neurologically strength 5 out of 5 in all 4 extremities cranial nerves II through XII intact 10. MSK: Normal  range of motion  body mass index is 28.9 kg/(m^2).   Labs on Admission:   Recent Labs  03/28/13 2046 03/28/13 2128  NA 132* 129*  K 3.8 3.5  CL 91* 86*  CO2  --  31  GLUCOSE 566* 524*  BUN 22 19  CREATININE 1.30 0.99  CALCIUM  --  9.8    Recent Labs  03/28/13 2128  AST 46*  ALT 61*  ALKPHOS 351*  BILITOT 0.9  PROT 6.9  ALBUMIN 3.6   No results found for this basename: LIPASE, AMYLASE,  in the last 72 hours  Recent Labs  03/28/13 2018 03/28/13 2046  WBC 11.7*  --  NEUTROABS 9.3*  --   HGB 15.5 16.3  HCT 44.9 48.0  MCV 91.8  --   PLT 143*  --    No results found for this basename: CKTOTAL, CKMB, CKMBINDEX, TROPONINI,  in the last 72 hours No results found for this basename: TSH, T4TOTAL, FREET3, T3FREE, THYROIDAB,  in the last 72 hours No results found for this basename: VITAMINB12, FOLATE, FERRITIN, TIBC, IRON, RETICCTPCT,  in the last 72 hours No results found for this basename: HGBA1C    Estimated Creatinine Clearance: 62.5 ml/min (by C-G formula based on Cr of 0.99). ABG    Component Value Date/Time   TCO2 31 03/28/2013 2046     No results found for this basename: DDIMER     Other results:  I have pearsonaly reviewed this: ECG REPORT  Rate: 81  Rhythm: NSR ST&T Change: q waves in II, avf  UA no infection, no ketones,    Cultures: No results found for this basename: sdes, specrequest, cult, reptstatus       Radiological Exams on Admission: Dg Chest 2 View  03/28/2013   CLINICAL DATA:  Altered mental status.  Ex-smoker.  EXAM: CHEST  2 VIEW  COMPARISON:  06/05/2011 and chest CT dated 06/19/2011.  FINDINGS: The heart remains normal in size and the aorta remains tortuous. No aneurysm on the previous CT. Interval small linear density at the left lung base. The right lung remains clear with a stable overlying nipple shadow. Thoracic spine degenerative changes, including changes of DISH. Mild scoliosis.  IMPRESSION: Interval minimal linear  atelectasis or scarring at the left lung base. Otherwise, unremarkable examination.   Electronically Signed   By: Gordan Payment M.D.   On: 03/28/2013 22:17    Chart has been reviewed  Assessment/Plan 79 year old gentleman with history of hypertension coronary artery disease and diabetes which is diet controlled presents with severe hyperglycemia and confusion now improving Present on Admission:  . Type 2 diabetes mellitus with vascular disease - patient will need to have primary care provider to manage his diabetes as an outpatient. Check his hemoglobin A1c given severity of his hyper glycemia he may need to be on insulin initially until this is better controlled.  . Hypertension - continue home medications  . CAD (coronary artery disease) - denies any chest pains currently will cycle cardiac markers  . Hyperglycemia - no evidence of ketoacidosis. We'll control blood sugars with glucose stabilizer  . LFT elevation - stop statins check hepatitis serologies patient denies alcohol abuse slight elevation possibly due to dehydration Slight dehydration will give IV fluids being gentle given history of valvular disorder  confusion most likely is secondary to elevated blood sugars neurologically intact currently his symptoms improving rapidly as his blood sugar has been improving, for completion will order a CT of the head. Further evaluation pending results  Prophylaxis:   Lovenox  CODE STATUS: FULL CODE  Other plan as per orders.  I have spent a total of 55 min on this admission  Benigno Check 03/28/2013, 11:37 PM

## 2013-03-28 NOTE — ED Provider Notes (Signed)
CSN: [id number]     Arrival date & time 03/28/13  1944 History   First MD Initiated Contact with Patient 03/28/13 2108     Chief Complaint  Patient presents with  . Altered Mental Status   (Consider location/radiation/quality/duration/timing/severity/associated sxs/prior Treatment) HPI Comments: 79 year old male presents with episodes of confusion and disorientation. Accompanied by a friend of 20-30 years. 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 Is been going on for several months and unchanged. Describes mild severity. Patient denies any shortness breath chest pain dysuria or other concerning signs or symptoms. Does not occasional confusion. Alert and oriented on arrival  Patient is a 79 y.o. male presenting with altered mental status.  Altered Mental Status Presenting symptoms: confusion and memory loss   Severity:  Mild Most recent episode:  Today Episode history:  Multiple Timing:  Intermittent Progression:  Unchanged Context: not alcohol use, not dementia and not drug use   Associated symptoms: no abdominal pain, no agitation, no depression, no fever, no headaches and no rash     Past Medical History  Diagnosis Date  . Hyperlipidemia     takes Lipitor daily  . Hypertension     takes Amlodipine,Metoprolol,and Losartan daily  . Hx of colonic polyps   . Type 2 diabetes mellitus with vascular disease   . Aortic stenosis     Last ECHO Dr. Donnie Aho 09/24/12  AVA 0.76 assymptomatic   . CAD (coronary artery disease) 09/24/2012    Cath 07/04/2002 normal Left main, 50% stenosis proximal LAD, 99% stenosis mid LAD, 50% stenosis mid CFX, 60% stenosis proximal PDA, stent placed mid LAD  Taxus stent to mid LAD 07/04/2002   . Hypertensive heart disease    Past Surgical History  Procedure Laterality Date  . Malignanat melanoma  2003  . Hernia repair  1957  . Appendectomy    . Cardiac catheterization  10+yrs  ago  . Coronary stent placement  2004    Taxus stent to mid LAD  . Colonoscopy    . Melanoma excision  06/07/2011    Procedure: MELANOMA EXCISION;  Surgeon: Shelly Rubenstein, MD;  Location: [facility] OR;  Service: General;  Laterality: Right;  wide excision melanoma right shoulder  and left back and left upper arm   . Mastoid debridement     Family History  Problem Relation Age of Onset  . Heart disease Mother   . Anesthesia problems Neg Hx   . Hypotension Neg Hx   . Malignant hyperthermia Neg Hx   . Pseudochol deficiency Neg Hx    History  Substance Use Topics  . Smoking status: Former Smoker    Quit date: 05/23/1958  . Smokeless tobacco: Never Used  . Alcohol Use: No    Review of Systems  Constitutional: Negative for fever and fatigue.  Respiratory: Negative for shortness of breath.   Cardiovascular: Negative for chest pain.  Gastrointestinal: Negative for abdominal pain.  Genitourinary: Negative for dysuria.  Musculoskeletal: Negative for back pain.  Skin: Negative for rash.  Neurological: Negative for headaches.  Psychiatric/Behavioral: Positive for memory loss and confusion. Negative for agitation.  All other systems reviewed and are negative.    Allergies  Review of patient's allergies indicates no known allergies.  Home Medications   Current Outpatient Rx  Name  Route  Sig  Dispense  Refill  . amLODipine (NORVASC) 5 MG tablet   Oral   Take 5 mg  by mouth daily.          Marland Kitchen aspirin 81 MG tablet   Oral   Take 81 mg by mouth daily.          Marland Kitchen atorvastatin (LIPITOR) 40 MG tablet   Oral   Take 40 mg by mouth daily.          . calcium carbonate (TUMS - DOSED IN MG ELEMENTAL CALCIUM) 500 MG chewable tablet   Oral   Chew 1 tablet by mouth daily.         Marland Kitchen losartan-hydrochlorothiazide (HYZAAR) 100-25 MG per tablet   Oral   Take 1 tablet by mouth daily.          . metoprolol succinate (TOPROL-XL) 100 MG 24 hr tablet   Oral   Take 100 mg by mouth  daily.          . Multiple Vitamin (MULTIVITAMIN) tablet   Oral   Take 1 tablet by mouth daily.         . mupirocin ointment (BACTROBAN) 2 %                BP 112/69  Pulse 87  Temp(Src) 98.1 F (36.7 C) (Oral)  Resp 16  Ht 5\' 8"  (1.727 m)  Wt 190 lb (86.183 kg)  BMI 28.90 kg/m2  SpO2 93% Physical Exam  Nursing note and vitals reviewed. Constitutional: He is oriented to person, place, and time. He appears well-developed and well-nourished.  HENT:  Head: Normocephalic and atraumatic.  Eyes: EOM are normal. Pupils are equal, round, and reactive to light.  Neck: Normal range of motion.  Cardiovascular: Normal rate, regular rhythm and intact distal pulses.   Murmur heard. Pulmonary/Chest: Effort normal and breath sounds normal. No respiratory distress. He exhibits no tenderness.  Abdominal: Soft. He exhibits no distension. There is no tenderness. There is no rebound and no guarding.  Musculoskeletal: Normal range of motion. He exhibits no edema.  Neurological: He is alert and oriented to person, place, and time. He displays normal reflexes. No cranial nerve deficit. He exhibits normal muscle tone. Coordination normal.  Skin: Skin is warm and dry. No rash noted.  Psychiatric: He has a normal mood and affect. His speech is normal and behavior is normal. Thought content normal.  Cognition and memory appear grossly intact on exam     ED Course  Procedures (including critical care time) Labs Review Labs Reviewed  CBC WITH DIFFERENTIAL - Abnormal; Notable for the following:    WBC 11.7 (*)    Platelets 143 (*)    Neutrophils Relative % 79 (*)    Neutro Abs 9.3 (*)    All other components within normal limits  URINALYSIS, ROUTINE W REFLEX MICROSCOPIC - Abnormal; Notable for the following:    APPearance CLOUDY (*)    Specific Gravity, Urine 1.035 (*)    Glucose, UA >1000 (*)    All other components within normal limits  COMPREHENSIVE METABOLIC PANEL - Abnormal; Notable  for the following:    Sodium 129 (*)    Chloride 86 (*)    Glucose, Bld 524 (*)    AST 46 (*)    ALT 61 (*)    Alkaline Phosphatase 351 (*)    GFR calc non Af Amer 75 (*)    GFR calc Af Amer 87 (*)    All other components within normal limits  GLUCOSE, CAPILLARY - Abnormal; Notable for the following:    Glucose-Capillary 426 (*)  All other components within normal limits  POCT I-STAT, CHEM 8 - Abnormal; Notable for the following:    Sodium 132 (*)    Chloride 91 (*)    Glucose, Bld 566 (*)    All other components within normal limits  URINE MICROSCOPIC-ADD ON  AMMONIA  BLOOD GAS, VENOUS   Imaging Review Dg Chest 2 View  03/28/2013   CLINICAL DATA:  Altered mental status.  Ex-smoker.  EXAM: CHEST  2 VIEW  COMPARISON:  06/05/2011 and chest CT dated 06/19/2011.  FINDINGS: The heart remains normal in size and the aorta remains tortuous. No aneurysm on the previous CT. Interval small linear density at the left lung base. The right lung remains clear with a stable overlying nipple shadow. Thoracic spine degenerative changes, including changes of DISH. Mild scoliosis.  IMPRESSION: Interval minimal linear atelectasis or scarring at the left lung base. Otherwise, unremarkable examination.   Electronically Signed   By: Gordan Payment M.D.   On: 03/28/2013 22:17    EKG Interpretation    Date/Time:  Saturday March 28 2013 21:20:20 EST Ventricular Rate:  81 PR Interval:  157 QRS Duration: 89 QT Interval:  383 QTC Calculation: 445 R Axis:   -64 Text Interpretation:  Sinus rhythm Inferolateral infarct, old No significant change since last tracing Confirmed by KNAPP  MD-J, JON ([id number]) on 03/28/2013 9:29:53 PM            MDM   1. Hyperglycemia   2. DM (diabetes mellitus)     Afebrile vital signs stable on arrival. Patient with history of diabetes, currently untreated and without PCP. Patient with several months worth of slight confusion. On arrival patient is alert and oriented x3.  No concerning neurological findings. History most consistent with possible early dementia et cetera. However per family friend was acute episode today surgical possible infectious or other causes undertaken. Significant for initial glucose greater than 500. Patient was given 500 fluid and placed on glucostabilizer protocol. UA showed no signs of infection. Chest x-ray within normal limits. No rashes or skin findings. Doubt infectious etiology. EKG normal. No CP, SOB etc. Doubt cardiac pathology. Patient likely with slight confusion over months from combination of being elderly and now with uncontrolled and untreated DM. This patient's arrival PCP followup if blood sugars greater than 500 contacted medicine for admission. Will be admitted to OBS for continued evaluation and mgmt.   Bridgett Larsson, MD 03/28/13 [phone]

## 2013-03-28 NOTE — ED Notes (Addendum)
Pt states, "I feel fine", "here because I seem to be having more frequent confusion, especially with directions, more frequently as of ~ 1 month ago", have some ringing in my ears (bilateral), (denies: pain, sob, dizziness, HA, visual changes, nvd, bleeding, fever or other sx). A&Ox4, ambulatory with steady slow careful gait, no droop or drift, speech clear, MAEx4, grip strength and coordination are equal and strong, PERRL, 3mm, brisk. HR regular. LS CTA, H/o DM. Here with friend (in w/r, Danny Waters). Does not check BS regularly, tries to control DM with diet, mentions losing ~ 40 lbs.

## 2013-03-29 ENCOUNTER — Observation Stay (HOSPITAL_COMMUNITY): Payer: Medicare Other

## 2013-03-29 ENCOUNTER — Inpatient Hospital Stay (HOSPITAL_COMMUNITY): Payer: Medicare Other

## 2013-03-29 ENCOUNTER — Encounter (HOSPITAL_COMMUNITY): Payer: Self-pay | Admitting: Emergency Medicine

## 2013-03-29 DIAGNOSIS — R7989 Other specified abnormal findings of blood chemistry: Secondary | ICD-10-CM

## 2013-03-29 DIAGNOSIS — R7309 Other abnormal glucose: Secondary | ICD-10-CM

## 2013-03-29 DIAGNOSIS — K869 Disease of pancreas, unspecified: Secondary | ICD-10-CM

## 2013-03-29 DIAGNOSIS — R739 Hyperglycemia, unspecified: Secondary | ICD-10-CM | POA: Diagnosis present

## 2013-03-29 DIAGNOSIS — C439 Malignant melanoma of skin, unspecified: Secondary | ICD-10-CM

## 2013-03-29 DIAGNOSIS — I10 Essential (primary) hypertension: Secondary | ICD-10-CM

## 2013-03-29 DIAGNOSIS — E119 Type 2 diabetes mellitus without complications: Secondary | ICD-10-CM

## 2013-03-29 LAB — COMPREHENSIVE METABOLIC PANEL
ALT: 63 U/L — ABNORMAL HIGH (ref 0–53)
Alkaline Phosphatase: 373 U/L — ABNORMAL HIGH (ref 39–117)
BUN: 16 mg/dL (ref 6–23)
CO2: 31 mEq/L (ref 19–32)
Chloride: 94 mEq/L — ABNORMAL LOW (ref 96–112)
Creatinine, Ser: 0.84 mg/dL (ref 0.50–1.35)
GFR calc Af Amer: >90 mL/min (ref >90)
GFR calc non Af Amer: 80 mL/min — ABNORMAL LOW (ref >90)
Glucose, Bld: 238 mg/dL — ABNORMAL HIGH (ref 70–99)
Potassium: 3.3 mEq/L — ABNORMAL LOW (ref 3.5–5.1)
Total Bilirubin: 0.8 mg/dL (ref 0.3–1.2)
Total Protein: 6.9 g/dL (ref 6.0–8.3)

## 2013-03-29 LAB — TROPONIN I
Troponin I: <0.3 ng/mL (ref <0.30)
Troponin I: <0.3 ng/mL (ref <0.30)
Troponin I: <0.3 ng/mL (ref <0.30)

## 2013-03-29 LAB — GLUCOSE, CAPILLARY
Glucose-Capillary: 318 mg/dL — ABNORMAL HIGH (ref 70–99)
Glucose-Capillary: 223 mg/dL — ABNORMAL HIGH (ref 70–99)
Glucose-Capillary: 167 mg/dL — ABNORMAL HIGH (ref 70–99)
Glucose-Capillary: 136 mg/dL — ABNORMAL HIGH (ref 70–99)
Glucose-Capillary: 160 mg/dL — ABNORMAL HIGH (ref 70–99)
Glucose-Capillary: 254 mg/dL — ABNORMAL HIGH (ref 70–99)
Glucose-Capillary: 218 mg/dL — ABNORMAL HIGH (ref 70–99)

## 2013-03-29 LAB — CBC
MCHC: 34.6 g/dL (ref 30.0–36.0)
Platelets: 140 10*3/uL — ABNORMAL LOW (ref 150–400)
RDW: 13.6 % (ref 11.5–15.5)
WBC: 12.1 10*3/uL — ABNORMAL HIGH (ref 4.0–10.5)

## 2013-03-29 LAB — MAGNESIUM: Magnesium: 1.7 mg/dL (ref 1.5–2.5)

## 2013-03-29 LAB — HEMOGLOBIN A1C
Hgb A1c MFr Bld: 14.1 % — ABNORMAL HIGH (ref <5.7)
Mean Plasma Glucose: 358 mg/dL — ABNORMAL HIGH (ref <117)

## 2013-03-29 LAB — HEPATITIS PANEL, ACUTE: HCV Ab: NEGATIVE

## 2013-03-29 MED ORDER — ACETAMINOPHEN 325 MG PO TABS: 650.0000 mg | ORAL_TABLET | Freq: Four times a day (QID) | ORAL | Status: DC | PRN

## 2013-03-29 MED ORDER — PNEUMOCOCCAL VAC POLYVALENT 25 MCG/0.5ML IJ INJ
0.5000 mL | INJECTION | INTRAMUSCULAR | Status: AC
  Administered 2013-03-30: 10:00:00 0.5 mL via INTRAMUSCULAR
  Filled 2013-03-29: qty 0.5

## 2013-03-29 MED ORDER — ONDANSETRON HCL 4 MG PO TABS: 4.0000 mg | ORAL_TABLET | Freq: Four times a day (QID) | ORAL | Status: DC | PRN

## 2013-03-29 MED ORDER — SODIUM CHLORIDE 0.9 % IV SOLN: INTRAVENOUS | Status: DC

## 2013-03-29 MED ORDER — METFORMIN HCL 500 MG PO TABS: 500.0000 mg | ORAL_TABLET | Freq: Two times a day (BID) | ORAL | Status: DC

## 2013-03-29 MED ORDER — IOHEXOL 300 MG/ML  SOLN
100.0000 mL | Freq: Once | INTRAMUSCULAR | Status: AC | PRN
  Administered 2013-03-29: 13:00:00 100 mL via INTRAVENOUS

## 2013-03-29 MED ORDER — INSULIN GLARGINE 100 UNIT/ML ~~LOC~~ SOLN
5.0000 [IU] | Freq: Every day | SUBCUTANEOUS | Status: DC
  Administered 2013-03-29: 5 [IU] via SUBCUTANEOUS
  Filled 2013-03-29: qty 0.05

## 2013-03-29 MED ORDER — INSULIN ASPART 100 UNIT/ML ~~LOC~~ SOLN
0.0000 [IU] | Freq: Three times a day (TID) | SUBCUTANEOUS | Status: DC
  Administered 2013-03-29 (×2): 8 [IU] via SUBCUTANEOUS
  Administered 2013-03-29 – 2013-03-30 (×3): 3 [IU] via SUBCUTANEOUS

## 2013-03-29 MED ORDER — POTASSIUM CHLORIDE CRYS ER 20 MEQ PO TBCR
40.0000 meq | EXTENDED_RELEASE_TABLET | Freq: Once | ORAL | Status: AC
  Administered 2013-03-29: 40 meq via ORAL
  Filled 2013-03-29: qty 2

## 2013-03-29 MED ORDER — SODIUM CHLORIDE 0.9 % IV SOLN
INTRAVENOUS | Status: DC
  Filled 2013-03-29: qty 1

## 2013-03-29 MED ORDER — METFORMIN HCL 500 MG PO TABS
500.0000 mg | ORAL_TABLET | Freq: Two times a day (BID) | ORAL | Status: DC
  Administered 2013-03-29 – 2013-03-30 (×3): 500 mg via ORAL
  Filled 2013-03-29 (×5): qty 1

## 2013-03-29 MED ORDER — INSULIN REGULAR BOLUS VIA INFUSION
0.0000 [IU] | Freq: Three times a day (TID) | INTRAVENOUS | Status: DC
  Filled 2013-03-29: qty 10

## 2013-03-29 MED ORDER — METFORMIN HCL 500 MG PO TABS
500.0000 mg | ORAL_TABLET | Freq: Two times a day (BID) | ORAL | Status: DC
  Filled 2013-03-29 (×2): qty 1

## 2013-03-29 MED ORDER — LIVING WELL WITH DIABETES BOOK
Freq: Once | Status: AC
  Administered 2013-03-29: 11:00:00
  Filled 2013-03-29: qty 1

## 2013-03-29 MED ORDER — HYDROCODONE-ACETAMINOPHEN 5-325 MG PO TABS: 1.0000 | ORAL_TABLET | ORAL | Status: DC | PRN

## 2013-03-29 MED ORDER — SODIUM CHLORIDE 0.9 % IV SOLN
INTRAVENOUS | Status: DC
  Administered 2013-03-29: 01:00:00 via INTRAVENOUS

## 2013-03-29 MED ORDER — INSULIN GLARGINE 100 UNIT/ML ~~LOC~~ SOLN
12.0000 [IU] | Freq: Every day | SUBCUTANEOUS | Status: DC
  Administered 2013-03-29: 12 [IU] via SUBCUTANEOUS
  Filled 2013-03-29 (×2): qty 0.12

## 2013-03-29 MED ORDER — ACETAMINOPHEN 650 MG RE SUPP: 650.0000 mg | Freq: Four times a day (QID) | RECTAL | Status: DC | PRN

## 2013-03-29 MED ORDER — ASPIRIN 81 MG PO CHEW
81.0000 mg | CHEWABLE_TABLET | Freq: Every day | ORAL | Status: DC
  Administered 2013-03-29 – 2013-03-30 (×2): 81 mg via ORAL
  Filled 2013-03-29 (×2): qty 1

## 2013-03-29 MED ORDER — LOSARTAN POTASSIUM 50 MG PO TABS
100.0000 mg | ORAL_TABLET | Freq: Every day | ORAL | Status: DC
  Administered 2013-03-29 – 2013-03-30 (×2): 100 mg via ORAL
  Filled 2013-03-29 (×2): qty 2

## 2013-03-29 MED ORDER — SODIUM CHLORIDE 0.9 % IJ SOLN
3.0000 mL | Freq: Two times a day (BID) | INTRAMUSCULAR | Status: DC
  Administered 2013-03-29 (×2): 3 mL via INTRAVENOUS

## 2013-03-29 MED ORDER — INSULIN ASPART 100 UNIT/ML ~~LOC~~ SOLN
0.0000 [IU] | Freq: Every day | SUBCUTANEOUS | Status: DC
  Administered 2013-03-29: 2 [IU] via SUBCUTANEOUS

## 2013-03-29 MED ORDER — ONDANSETRON HCL 4 MG/2ML IJ SOLN: 4.0000 mg | Freq: Four times a day (QID) | INTRAMUSCULAR | Status: DC | PRN

## 2013-03-29 MED ORDER — ENOXAPARIN SODIUM 40 MG/0.4ML ~~LOC~~ SOLN
40.0000 mg | SUBCUTANEOUS | Status: DC
  Administered 2013-03-29: 40 mg via SUBCUTANEOUS
  Filled 2013-03-29 (×2): qty 0.4

## 2013-03-29 MED ORDER — METOPROLOL SUCCINATE ER 100 MG PO TB24
100.0000 mg | ORAL_TABLET | Freq: Every day | ORAL | Status: DC
  Administered 2013-03-29 – 2013-03-30 (×2): 100 mg via ORAL
  Filled 2013-03-29 (×2): qty 1

## 2013-03-29 MED ORDER — DEXTROSE 50 % IV SOLN: 25.0000 mL | INTRAVENOUS | Status: DC | PRN

## 2013-03-29 MED ORDER — AMLODIPINE BESYLATE 5 MG PO TABS
5.0000 mg | ORAL_TABLET | Freq: Every day | ORAL | Status: DC
  Administered 2013-03-29 – 2013-03-30 (×2): 5 mg via ORAL
  Filled 2013-03-29 (×2): qty 1

## 2013-03-29 MED ORDER — DOCUSATE SODIUM 100 MG PO CAPS
100.0000 mg | ORAL_CAPSULE | Freq: Two times a day (BID) | ORAL | Status: DC
  Administered 2013-03-29 – 2013-03-30 (×2): 100 mg via ORAL
  Filled 2013-03-29 (×4): qty 1

## 2013-03-29 NOTE — Progress Notes (Signed)
NURSING PROGRESS NOTE  Danny Waters [id number] Admission Data: 03/29/2013 0100 Attending Provider: Therisa Doyne, MD NWG:NFAOZH JR,W SPENCER, MD Code Status: Full  Danny Waters is a 79 y.o. male patient admitted from ED:  -No acute distress noted.  -No complaints of shortness of breath.  -No complaints of chest pain.   Cardiac Monitoring: Box # 15 in place. Cardiac monitor yields:normal sinus rhythm.  Blood pressure 133/83, pulse 70, temperature 98 F (36.7 C), temperature source Oral, resp. rate 18, height 5\' 8"  (1.727 m), weight 86.183 kg (190 lb), SpO2 94.00%.   IV Fluids:  IV in place, occlusive dsg intact without redness, IV cath antecubital right, condition patent and no redness On insulin drip  Allergies:  Review of patient's allergies indicates no known allergies.  Past Medical History:   has a past medical history of Hyperlipidemia; Hypertension; colonic polyps; Type 2 diabetes mellitus with vascular disease; Aortic stenosis; CAD (coronary artery disease) (09/24/2012); and Hypertensive heart disease.  Past Surgical History:   has past surgical history that includes malignanat melanoma (2003); Hernia repair [phone]); Appendectomy; Cardiac catheterization (10+yrs ago); Coronary stent placement (2004); Colonoscopy; Melanoma excision (06/07/2011); and Mastoid debridement.  Social History:   reports that he quit smoking about 54 years ago. He has never used smokeless tobacco. He reports that he does not drink alcohol or use illicit drugs.  Skin: WNL  Patient orientated to room. Information packet given to patient. Admission inpatient armband information verified with patient to include name and date of birth and placed on patient arm. Side rails up x 2, fall assessment and education completed with patient. Patient able to verbalize understanding of risk associated with falls and verbalized understanding to call for assistance before getting out of bed. Call light within reach.  Patienty able to voice and demonstrate understanding of unit orientation instructions.

## 2013-03-29 NOTE — Progress Notes (Signed)
Utilization Review Completed.Danny Waters T12/10/2012  

## 2013-03-29 NOTE — Progress Notes (Signed)
CBG 156. On call MD notified and ordered to stop insulin drip. Insulin and IV fluids stopped. Will continue to monitor.

## 2013-03-29 NOTE — Progress Notes (Signed)
   CARE MANAGEMENT NOTE 03/29/2013  Patient:  Danny Waters, Danny Waters   Account Number:  [id number]  Date Initiated:  03/29/2013  Documentation initiated by:  [facility]  Subjective/Objective Assessment:   adm: confusion  PCP : [facility]     Action/Plan:   discharge planning   Anticipated DC Date:  03/29/2013   Anticipated DC Plan:  HOME W HOME HEALTH SERVICES      DC Planning Services  CM consult      [facility] Choice  HOME HEALTH   Choice offered to / List presented to:  C-1 Patient        HH arranged  HH-1 RN  HH-10 DISEASE MANAGEMENT      HH agency  [facility]   Status of service:  Completed, signed off Medicare Important Message given?   (If response is "NO", the following Medicare IM given date fields will be blank) Date Medicare IM given:   Date Additional Medicare IM given:    Discharge Disposition:  HOME W HOME HEALTH SERVICES  Per UR Regulation:    If discussed at Long Length of Stay Meetings, dates discussed:    Comments:  03/29/13 09:15 CM spoke with pt in room to offer choice. Pt states his girlfriend will probably answering phone and "that's okay."  Pt chooses [facility] for [facility] for disease mgmt of Diabetes (accu checks and nutrition eval).  Address and contact numbers were confirmed.   Referral faxed to [facility] for [facility].  No other CM needs were communicated.  Freddy Jaksch, BSN, CM [phone].

## 2013-03-29 NOTE — Progress Notes (Addendum)
[facility] PROGRESS NOTE  Danny Waters ZOX:[id number] DOB: 11/30/1929 DOA: 03/28/2013 PCP: [name], MD  Assessment/Plan: 1. Pancreatic mass. CT scan of abdomen and pelvis today showed a 5.3 cm pancreatic tail mass which is suspicious for primary pancreatic lesion, with numerous hepatic metastatic lesions. Plan for biopsy, consult interventional radiology, obtain tumor markers, await further recommendations from oncology. 2. History of melanoma. Patient with history of stage II melanoma, status post wide excision without adjuvant therapy. Following Dr. Arbutus Ped of medical oncology, having his last office visit on 12/29/2012. Medical oncology consulted. 3. Hyperglycemic hyperosmotic state. Patient presenting with blood sugars in the 600 range, placed on IV insulin which was discontinued this morning. His diet has been advanced. Will continue Lantus at 12 units subcutaneous each bedtime. Continue Accu-Cheks q. a.c. and each bedtime with sliding scale coverage. 4. Hypertension. Blood pressure stable, continue amlodipine and metoprolol therapy 5. Coronary artery disease. Continue aspirin and  beta blocker. Presently denies chest discomfort. Statin therapy discontinued secondary to LFT elevation. 6. Metabolic encephalopathy. Likely secondary to hyperglycemic hyperosmolar state.    Code Status: Full Code Family Communication: Family members updated on patient's condition.  Disposition Plan: Will obtain CT guided biopsy.    Consultants:  Oncology   HPI/Subjective: Patient is a pleasant 79 year old gentleman with a past medical history of malignant melanoma, diet-controlled diabetes mellitus who was admitted on 03/28/2013, presenting with mental status changes, found to have a blood sugars greater than 600. Patient was started on IV insulin and admitted to the medicine service. Blood sugars improved and by this morning he was taken off the insulin drip and started on a diabetic  diet. Labs showed an elevated alkaline phosphatase for which a CT scan was ordered, unfortunately this study revealed a 5.3 cm pancreatic tail mass highly suspicious for primary pancreatic lesion as well as numerous hepatic metastasis on both lobes.  I discussed case with Dr. Welton Flakes, requesting oncology consultation.    Objective: Filed Vitals:   03/29/13 1330  BP: 116/71  Pulse: 70  Temp: 97.7 F (36.5 C)  Resp: 18   No intake or output data in the 24 hours ending 03/29/13 1546 Filed Weights   03/28/13 2012  Weight: 86.183 kg (190 lb)    Exam:   General:  No acute distress, reports feeling better this am.  Cardiovascular: Regular rate and rhythm, normal S1S2  Respiratory: Lungs are clear to auscultation, no wheezing rhonchi or rales.   Abdomen: Soft, nontender, nondistended.   Musculoskeletal: no edema, preserved ROM  Data Reviewed: Basic Metabolic Panel:  Recent Labs Lab 03/28/13 2046 03/28/13 2128 03/29/13 0138  NA 132* 129* 137  K 3.8 3.5 3.3*  CL 91* 86* 94*  CO2  --  31 31  GLUCOSE 566* 524* 238*  BUN 22 19 16   CREATININE 1.30 0.99 0.84  CALCIUM  --  9.8 10.1  MG  --   --  1.7  PHOS  --   --  2.1*   Liver Function Tests:  Recent Labs Lab 03/28/13 2128 03/29/13 0138  AST 46* 48*  ALT 61* 63*  ALKPHOS 351* 373*  BILITOT 0.9 0.8  PROT 6.9 6.9  ALBUMIN 3.6 3.5   No results found for this basename: LIPASE, AMYLASE,  in the last 168 hours  Recent Labs Lab 03/28/13 2128  AMMONIA 28   CBC:  Recent Labs Lab 03/28/13 2018 03/28/13 2046 03/29/13 0138  WBC 11.7*  --  12.1*  NEUTROABS 9.3*  --   --  HGB 15.5 16.3 15.8  HCT 44.9 48.0 45.6  MCV 91.8  --  92.1  PLT 143*  --  140*   Cardiac Enzymes:  Recent Labs Lab 03/29/13 0138 03/29/13 0655  TROPONINI <0.30 <0.30   BNP (last 3 results) No results found for this basename: PROBNP,  in the last 8760 hours CBG:  Recent Labs Lab 03/29/13 0410 03/29/13 0513 03/29/13 0616  03/29/13 0759 03/29/13 1152  GLUCAP 136* 160* 156* 187* 254*    No results found for this or any previous visit (from the past 240 hour(s)).   Studies: Dg Chest 2 View  03/28/2013   CLINICAL DATA:  Altered mental status.  Ex-smoker.  EXAM: CHEST  2 VIEW  COMPARISON:  06/05/2011 and chest CT dated 06/19/2011.  FINDINGS: The heart remains normal in size and the aorta remains tortuous. No aneurysm on the previous CT. Interval small linear density at the left lung base. The right lung remains clear with a stable overlying nipple shadow. Thoracic spine degenerative changes, including changes of DISH. Mild scoliosis.  IMPRESSION: Interval minimal linear atelectasis or scarring at the left lung base. Otherwise, unremarkable examination.   Electronically Signed   By: Gordan Payment M.D.   On: 03/28/2013 22:17   Ct Head Wo Contrast  03/29/2013   CLINICAL DATA:  Altered mental status; hyperglycemia.  EXAM: CT HEAD WITHOUT CONTRAST  TECHNIQUE: Contiguous axial images were obtained from the base of the skull through the vertex without intravenous contrast.  COMPARISON:  CT of the head performed 06/19/2011  FINDINGS: There is no evidence of acute infarction, mass lesion, or intra- or extra-axial hemorrhage on CT.  Prominence of the sulci suggests mild cortical volume loss. Mild periventricular white matter change likely reflects small vessel ischemic microangiopathy. Mild cerebellar atrophy is noted.  The posterior fossa, including the cerebellum, brainstem and fourth ventricle, is within normal limits. The third and lateral ventricles, and basal ganglia are unremarkable in appearance. The cerebral hemispheres are symmetric in appearance, with normal gray-white differentiation. No mass effect or midline shift is seen.  There is no evidence of fracture; visualized osseous structures are unremarkable in appearance. The orbits are within normal limits. The paranasal sinuses and mastoid air cells are well-aerated. No  significant soft tissue abnormalities are seen.  IMPRESSION: 1. No acute intracranial pathology seen on CT. 2. Mild cortical volume loss and scattered small vessel ischemic microangiopathy.   Electronically Signed   By: Roanna Raider M.D.   On: 03/29/2013 02:47   Ct Abdomen Pelvis W Contrast  03/29/2013   CLINICAL DATA:  Leukocytosis, elevated alk phos, hyperglycemia  EXAM: CT ABDOMEN AND PELVIS WITH CONTRAST  TECHNIQUE: Multidetector CT imaging of the abdomen and pelvis was performed using the standard protocol following bolus administration of intravenous contrast.  CONTRAST:  OMNIPAQUE IOHEXOL 300 MG/ML  SOLN  COMPARISON:  06/19/2011  FINDINGS: Mild scarring in the bilateral lower lobes.  Cardiomegaly. Coronary atherosclerosis. Atherosclerotic calcifications of the aortic valve.  2.2 x 2.6 cm probable hemangioma in the posterior segment right hepatic lobe (series 2/ image 26), unchanged. Numerous additional hypoenhancing hepatic lesions in both lobes, new, suspicious for metastases. Dominant lesions include:  --2.8 x 2.6 cm lesion in the posterior right hepatic dome (series 2/ image 20)  --1.5 x 1.4 cm lesion in the lateral segment left hepatic lobe (series 2/image 23)  --2.9 x 2.6 cm lesion in the anterior segment right hepatic lobe (series 2/image 26)  5.3 x 4.4 x 4.6 cm hypoenhancing  mass in the pancreatic tail (series 2/ image 29), highly suspicious for primary pancreatic neoplasm.  Possible direct invasion versus metastasis in the splenic hilum (series 2/ image 27).  Mild nodularity of the bilateral adrenal glands with suspected 1.4 cm left adrenal metastasis (series 2/image 33).  Gallbladder is unremarkable. No intrahepatic or extrahepatic ductal dilatation.  Multiple bilateral renal cysts measuring up to 2.2 cm in the posterior right lower pole (series 7/image 20) and 1.2 cm in the left lateral interpolar region (series 7/ image 16). Bilateral renal cortical scarring. No hydronephrosis.  No  evidence of bowel obstruction.  Atherosclerotic calcifications of the abdominal aorta and branch vessels. 3.7 x 3.3 cm infrarenal abdominal aortic aneurysm (series 2/ image 42). Additional fusiform ectasia measuring up to 3.2 x 3.0 cm just above the aortic bifurcation.  No abdominopelvic ascites.  10 mm short axis retroperitoneal noted just above the left renal vein (series 2/image 34).  Prostate is notable for margin of the central gland which indents the base of the bladder.  Bladder is unremarkable.  Degenerative changes of the visualized thoracolumbar spine.  IMPRESSION: 5.3 cm pancreatic tail mass, highly suspicious for primary pancreatic neoplasm.  Numerous hepatic metastases in both lobes, with index lesions as described above.  Direct invasion versus splenic metastasis.  Left adrenal metastasis.  Additional ancillary findings as above.   Electronically Signed   By: Charline Bills M.D.   On: 03/29/2013 13:45    Scheduled Meds: . amLODipine  5 mg Oral Daily  . aspirin  81 mg Oral Daily  . docusate sodium  100 mg Oral BID  . enoxaparin (LOVENOX) injection  40 mg Subcutaneous Q24H  . insulin aspart  0-15 Units Subcutaneous TID WC  . insulin aspart  0-5 Units Subcutaneous QHS  . insulin glargine  12 Units Subcutaneous QHS  . losartan  100 mg Oral Daily  . metFORMIN  500 mg Oral BID WC  . metoprolol succinate  100 mg Oral Daily  . [START ON 03/30/2013] pneumococcal 23 valent vaccine  0.5 mL Intramuscular Tomorrow-1000  . sodium chloride  3 mL Intravenous Q12H   Continuous Infusions:   Active Problems:   CAD (coronary artery disease)   Type 2 diabetes mellitus with vascular disease   Hypertension   Hyperglycemia   LFT elevation   Hyperglycemia without ketosis    Time spent: 35 minutes    Jeralyn Bennett  [facility] Pager [phone]. If 7PM-7AM, please contact night-coverage at [email or web address], password [facility] 03/29/2013, 3:46 PM  LOS: 1 day

## 2013-03-29 NOTE — ED Provider Notes (Signed)
I saw and evaluated the patient, reviewed the resident's note and I agree with the findings and plan.  EKG Interpretation    Date/Time:  Saturday March 28 2013 21:20:20 EST Ventricular Rate:  81 PR Interval:  157 QRS Duration: 89 QT Interval:  383 QTC Calculation: 445 R Axis:   -64 Text Interpretation:  Sinus rhythm Inferolateral infarct, old No significant change since last tracing Confirmed by Kristilyn Coltrane  MD-J, Tzirel Leonor ([id number]) on 03/28/2013 9:29:53 PM           Pt with uncontrolled hyperglycemia.  No DKA Alert and oriented here.  Will admit for blood sugar control, diabetic teaching and further treatment.  Celene Kras, MD 03/29/13 [phone]

## 2013-03-29 NOTE — Clinical Social Work Note (Signed)
CSW received consult for setting up appointment with local PCP for hospital follow-up. Inappropriate consult, weekend [facility] notified. CSW signing off.  Roddie Mc, [place], [place], [phone]

## 2013-03-30 ENCOUNTER — Inpatient Hospital Stay (HOSPITAL_COMMUNITY): Payer: Medicare Other

## 2013-03-30 ENCOUNTER — Encounter (HOSPITAL_COMMUNITY): Payer: Self-pay | Admitting: Radiology

## 2013-03-30 ENCOUNTER — Other Ambulatory Visit: Payer: Self-pay | Admitting: Medical Oncology

## 2013-03-30 DIAGNOSIS — E1159 Type 2 diabetes mellitus with other circulatory complications: Secondary | ICD-10-CM

## 2013-03-30 DIAGNOSIS — I798 Other disorders of arteries, arterioles and capillaries in diseases classified elsewhere: Secondary | ICD-10-CM

## 2013-03-30 DIAGNOSIS — G9341 Metabolic encephalopathy: Secondary | ICD-10-CM

## 2013-03-30 DIAGNOSIS — C439 Malignant melanoma of skin, unspecified: Secondary | ICD-10-CM

## 2013-03-30 LAB — CBC
HCT: 41 % (ref 39.0–52.0)
Hemoglobin: 14.3 g/dL (ref 13.0–17.0)
MCHC: 34.9 g/dL (ref 30.0–36.0)
MCV: 90.7 fL (ref 78.0–100.0)
RBC: 4.52 MIL/uL (ref 4.22–5.81)
WBC: 10 10*3/uL (ref 4.0–10.5)

## 2013-03-30 LAB — GLUCOSE, CAPILLARY: Glucose-Capillary: 166 mg/dL — ABNORMAL HIGH (ref 70–99)

## 2013-03-30 MED ORDER — INSULIN GLARGINE 100 UNIT/ML ~~LOC~~ SOLN
15.0000 [IU] | Freq: Every day | SUBCUTANEOUS | Status: DC
  Filled 2013-03-30: qty 0.15

## 2013-03-30 MED ORDER — MIDAZOLAM HCL 2 MG/2ML IJ SOLN
INTRAMUSCULAR | Status: AC | PRN
  Administered 2013-03-30: 2 mg via INTRAVENOUS

## 2013-03-30 MED ORDER — BD GETTING STARTED TAKE HOME KIT: 3/10ML X 30G SYRINGES: 1.0000 | Freq: Once | Status: DC

## 2013-03-30 MED ORDER — MIDAZOLAM HCL 2 MG/2ML IJ SOLN
INTRAMUSCULAR | Status: AC
  Filled 2013-03-30: qty 4

## 2013-03-30 MED ORDER — FENTANYL CITRATE 0.05 MG/ML IJ SOLN
INTRAMUSCULAR | Status: AC | PRN
  Administered 2013-03-30: 50 ug via INTRAVENOUS

## 2013-03-30 MED ORDER — BD GETTING STARTED TAKE HOME KIT: 3/10ML X 30G SYRINGES
1.0000 | Freq: Once | Status: AC
  Administered 2013-03-30: 14:00:00 1
  Filled 2013-03-30: qty 1

## 2013-03-30 MED ORDER — IOHEXOL 300 MG/ML  SOLN
80.0000 mL | Freq: Once | INTRAMUSCULAR | Status: AC | PRN
  Administered 2013-03-30: 80 mL via INTRAVENOUS

## 2013-03-30 MED ORDER — ENOXAPARIN SODIUM 40 MG/0.4ML ~~LOC~~ SOLN
40.0000 mg | SUBCUTANEOUS | Status: DC
  Filled 2013-03-30: qty 0.4

## 2013-03-30 MED ORDER — INSULIN GLARGINE 100 UNIT/ML ~~LOC~~ SOLN: 15.0000 [IU] | Freq: Every day | SUBCUTANEOUS | Status: DC

## 2013-03-30 MED ORDER — FENTANYL CITRATE 0.05 MG/ML IJ SOLN
INTRAMUSCULAR | Status: AC
  Filled 2013-03-30: qty 2

## 2013-03-30 NOTE — Discharge Summary (Addendum)
Physician Discharge Summary  Danny Waters VHQ:[id number] DOB: [birth year] DOA: 03/28/2013  PCP: [name], MD  Admit date: 03/28/2013 Discharge date: 03/30/2013  Time spent: 45 minutes  Recommendations for Outpatient Follow-up:  1. Please followup on biopsy results. Patient undergoing ultrasound-guided biopsy of metastatic lesions involving liver 2. Followup on blood sugars, he is being discharged on Lantus 15 units subcutaneous daily, presented with hyperosmotic hyperglycemic state. Has a HgA1C of 14.1 3. Followup on blood pressures. Because of hypotension, losartan and amlodipine were discontinued, remains on metoprolol  Discharge Diagnoses:  Active Problems:   CAD (coronary artery disease)   Type 2 diabetes mellitus with vascular disease   Hypertension   Hyperglycemia   LFT elevation   Hyperglycemia without ketosis   Discharge Condition: Stable/improved  Diet recommendation: Diabetic diet  Filed Weights   03/28/13 2012  Weight: 86.183 kg (190 lb)    History of present illness:  Danny Waters is a 79 y.o. male  has a past medical history of Hyperlipidemia; Hypertension; colonic polyps; Type 2 diabetes mellitus with vascular disease; Aortic stenosis; CAD (coronary artery disease) (09/24/2012); and Hypertensive heart disease.  Presented with  Patient was found driving in the wrong direction. He was brought to ER and was noted to have blood sugar in 600's. He was started on insulin gtt and hospitalist called for admission. He has hx of diet controlled DM2.  He states he has no PCP and no one has been following his blood sugars. Since he has been on insulin his mental status have improved. Denies alcohol abuse. LFT's were noted to slightly elevated.  He has hx of melanoma that has been excised.  Denies any chest pain or shortness of breath. He has been urinating somewhat more than usual. He has had some blurred vision. Denies fatigue. He has lost 40 lb over past year  intentionally. He has been eating more vegetables an has cut out sweets.   Hospital Course:  Patient is a pleasant 79 year old woman with a past medical history of malignant melanoma, admitted to the medicine service on 03/28/2013, presenting with mental status changes in setting of hyperglycemia. Patient having initial blood sugars in the 600 range, felt to be secondary to hyperosmolar hyperglycemic state. He was started on IV insulin and by the following morning blood sugars came down to the 100s. This was discontinued as his diet was advanced. Patient was started on Lantus. Because of elevated liver enzymes, and the presentation of hyperglycemia, there was concern for the possibility of an underlying infectious process precipitating hyperglycemia. Unfortunately CT scan revealed a 5.3 cm pancreatic tail mass as well as numerous hepatic  metastasis in both lobes. There was direct invasion versus splenic metastasis. Also noted was left adrenal metastasis. I discussed case with Dr.Khan of medical oncology. He had an ultrasound-guided biopsy of liver lesion on 03/30/2013. He tolerated procedure well there are no immediate complications. On 03/30/2013 I spoke with his oncologist Dr.Mohamed, who recommended further workup with a CT scan of lungs. Dr.Mohamed stated that he will followup on CT of lungs as well as liver biopsy and see him in the office next week. With regard to his blood sugars, on the morning of 03/30/13 had a blood sugar of 168. Diabetic educator was consulted as was social work and Sports coach to establish patient with a primary care provider for hospital followup. He was also set up with home health services prior to discharge for RN and assistance with diabetic management. Patient was instructed  to check his blood sugars 3 times a day before meals, record these values on a log book and present these to his primary care provider.  Procedures:  Ultrasound-guided biopsy of liver metastatic  lesion  Consultations:  Interventional radiology  Medical oncology  Discharge Exam: Filed Vitals:   03/30/13 0955  BP: 108/76  Pulse: 70  Temp:   Resp: 19    General: He is in no acute distress, and later on his room, reports doing well. Cardiovascular: Regular rate rhythm normal S1-S2 no murmurs gallops Respiratory: Lungs are clear to auscultation bilaterally no wheezing rhonchi rales Abdomen: He is a benign abdominal examination, soft nontender nondistended Extremities: No edema  Discharge Instructions  Discharge Orders   Future Appointments Provider Department Dept Phone   06/29/2013 10:00 AM Windell [facility] [phone]   06/29/2013 11:00 AM Wl-Dg 4 (Chest) [facility] [phone]   06/30/2013 10:00 AM Si Gaul, MD [facility] [phone]   Future Orders Complete By Expires   Call MD for:  difficulty breathing, headache or visual disturbances  As directed    Call MD for:  extreme fatigue  As directed    Call MD for:  persistant dizziness or light-headedness  As directed    Call MD for:  persistant nausea and vomiting  As directed    Call MD for:  severe uncontrolled pain  As directed    Call MD for:  temperature >100.4  As directed    Diet - low sodium heart healthy  As directed    Diet - low sodium heart healthy  As directed    Discharge instructions  As directed    Comments:     1) Please check your blood sugars 3 times a day before each meal, record the values on a log book and show them to your family doctor on the hospital follow up appointment.  2) We are starting you on Metformin, this is a medication to help control your diabetes. Please take it twice a day, let your physician know if you experience nausea, vomiting, diarrhea.   Discharge instructions  As directed    Comments:     Please check your blood sugars 3 times a day before meals,  record the values on a log book and present these to your family physician on your hospital follow up appointment.   You will be prescribed Insulin, giving yourself 15 units at bedtime.   Please be aware of signs of low blood sugars such as feeling week, having tremors, feeling sweaty, lightheaded. Check your blood sugar as you may need to ingest sugar to bring your blood sugars up.   Increase activity slowly  As directed    Increase activity slowly  As directed        Medication List    STOP taking these medications       amLODipine 5 MG tablet  Commonly known as:  NORVASC     atorvastatin 40 MG tablet  Commonly known as:  LIPITOR     losartan-hydrochlorothiazide 100-25 MG per tablet  Commonly known as:  HYZAAR      TAKE these medications       aspirin 81 MG tablet  Take 81 mg by mouth daily.     calcium carbonate 500 MG chewable tablet  Commonly known as:  TUMS - dosed in mg elemental calcium  Chew 1 tablet by mouth daily.     insulin  glargine 100 UNIT/ML injection  Commonly known as:  LANTUS  Inject 0.15 mLs (15 Units total) into the skin at bedtime.     metFORMIN 500 MG tablet  Commonly known as:  GLUCOPHAGE  Take 1 tablet (500 mg total) by mouth 2 (two) times daily with a meal.     metoprolol succinate 100 MG 24 hr tablet  Commonly known as:  TOPROL-XL  Take 100 mg by mouth daily.     multivitamin tablet  Take 1 tablet by mouth daily.     mupirocin ointment 2 %  Commonly known as:  BACTROBAN       No Known Allergies     Follow-up Information   Follow up with TILLEY JR,W SPENCER, MD In 3 days.   Specialty:  Cardiology   Contact information:   [address] [place] [address] [place] [address] [phone]       Follow up with [facility]. (home health nurse for disease mgmt (diabetes))    Contact information:   [address] [place] [address] [phone]        The results of significant diagnostics from this  hospitalization (including imaging, microbiology, ancillary and laboratory) are listed below for reference.    Significant Diagnostic Studies: Dg Chest 2 View  03/28/2013   CLINICAL DATA:  Altered mental status.  Ex-smoker.  EXAM: CHEST  2 VIEW  COMPARISON:  06/05/2011 and chest CT dated 06/19/2011.  FINDINGS: The heart remains normal in size and the aorta remains tortuous. No aneurysm on the previous CT. Interval small linear density at the left lung base. The right lung remains clear with a stable overlying nipple shadow. Thoracic spine degenerative changes, including changes of DISH. Mild scoliosis.  IMPRESSION: Interval minimal linear atelectasis or scarring at the left lung base. Otherwise, unremarkable examination.   Electronically Signed   By: Gordan Payment M.D.   On: 03/28/2013 22:17   Ct Head Wo Contrast  03/29/2013   CLINICAL DATA:  Altered mental status; hyperglycemia.  EXAM: CT HEAD WITHOUT CONTRAST  TECHNIQUE: Contiguous axial images were obtained from the base of the skull through the vertex without intravenous contrast.  COMPARISON:  CT of the head performed 06/19/2011  FINDINGS: There is no evidence of acute infarction, mass lesion, or intra- or extra-axial hemorrhage on CT.  Prominence of the sulci suggests mild cortical volume loss. Mild periventricular white matter change likely reflects small vessel ischemic microangiopathy. Mild cerebellar atrophy is noted.  The posterior fossa, including the cerebellum, brainstem and fourth ventricle, is within normal limits. The third and lateral ventricles, and basal ganglia are unremarkable in appearance. The cerebral hemispheres are symmetric in appearance, with normal gray-white differentiation. No mass effect or midline shift is seen.  There is no evidence of fracture; visualized osseous structures are unremarkable in appearance. The orbits are within normal limits. The paranasal sinuses and mastoid air cells are well-aerated. No significant soft  tissue abnormalities are seen.  IMPRESSION: 1. No acute intracranial pathology seen on CT. 2. Mild cortical volume loss and scattered small vessel ischemic microangiopathy.   Electronically Signed   By: Roanna Raider M.D.   On: 03/29/2013 02:47   Ct Abdomen Pelvis W Contrast  03/29/2013   CLINICAL DATA:  Leukocytosis, elevated alk phos, hyperglycemia  EXAM: CT ABDOMEN AND PELVIS WITH CONTRAST  TECHNIQUE: Multidetector CT imaging of the abdomen and pelvis was performed using the standard protocol following bolus administration of intravenous contrast.  CONTRAST:  OMNIPAQUE IOHEXOL  300 MG/ML  SOLN  COMPARISON:  06/19/2011  FINDINGS: Mild scarring in the bilateral lower lobes.  Cardiomegaly. Coronary atherosclerosis. Atherosclerotic calcifications of the aortic valve.  2.2 x 2.6 cm probable hemangioma in the posterior segment right hepatic lobe (series 2/ image 26), unchanged. Numerous additional hypoenhancing hepatic lesions in both lobes, new, suspicious for metastases. Dominant lesions include:  --2.8 x 2.6 cm lesion in the posterior right hepatic dome (series 2/ image 20)  --1.5 x 1.4 cm lesion in the lateral segment left hepatic lobe (series 2/image 23)  --2.9 x 2.6 cm lesion in the anterior segment right hepatic lobe (series 2/image 26)  5.3 x 4.4 x 4.6 cm hypoenhancing mass in the pancreatic tail (series 2/ image 29), highly suspicious for primary pancreatic neoplasm.  Possible direct invasion versus metastasis in the splenic hilum (series 2/ image 27).  Mild nodularity of the bilateral adrenal glands with suspected 1.4 cm left adrenal metastasis (series 2/image 33).  Gallbladder is unremarkable. No intrahepatic or extrahepatic ductal dilatation.  Multiple bilateral renal cysts measuring up to 2.2 cm in the posterior right lower pole (series 7/image 20) and 1.2 cm in the left lateral interpolar region (series 7/ image 16). Bilateral renal cortical scarring. No hydronephrosis.  No evidence of bowel  obstruction.  Atherosclerotic calcifications of the abdominal aorta and branch vessels. 3.7 x 3.3 cm infrarenal abdominal aortic aneurysm (series 2/ image 42). Additional fusiform ectasia measuring up to 3.2 x 3.0 cm just above the aortic bifurcation.  No abdominopelvic ascites.  10 mm short axis retroperitoneal noted just above the left renal vein (series 2/image 34).  Prostate is notable for margin of the central gland which indents the base of the bladder.  Bladder is unremarkable.  Degenerative changes of the visualized thoracolumbar spine.  IMPRESSION: 5.3 cm pancreatic tail mass, highly suspicious for primary pancreatic neoplasm.  Numerous hepatic metastases in both lobes, with index lesions as described above.  Direct invasion versus splenic metastasis.  Left adrenal metastasis.  Additional ancillary findings as above.   Electronically Signed   By: Charline Bills M.D.   On: 03/29/2013 13:45   US Biopsy  03/30/2013   CLINICAL DATA:  Multiple hepatic mass lesions and 5 cm mass of the tail of the pancreas. History of melanoma. The patient presents for biopsy of one of the hepatic masses.  EXAM: ULTRASOUND GUIDED CORE BIOPSY OF LIVER  MEDICATIONS: 2.0 mg IV Versed; 50 mcg IV Fentanyl  Total Moderate Sedation Time: 13 min.  PROCEDURE: The procedure, risks, benefits, and alternatives were explained to the patient. Questions regarding the procedure were encouraged and answered. The patient understands and consents to the procedure.  The right abdominal wall was prepped with Betadine in a sterile fashion, and a sterile drape was applied covering the operative field. A sterile gown and sterile gloves were used for the procedure. Local anesthesia was provided with 1% Lidocaine.  Ultrasound was used to localize lesions in the liver. A lesion in the right lobe was chosen for sampling. Under direct ultrasound guidance, a 17 gauge needle was advanced to the lateral margin of the lesion. Core biopsy was performed with  an 18 gauge device. A total of 3 samples were obtained and submitted in formalin. The outer needle was then removed and additional ultrasound performed.  COMPLICATIONS: None.  FINDINGS: The largest mass lesion in the right lobe of the liver roughly at the same level as the gallbladder was well visualized by ultrasound. Solid tissue was obtained from the  lesion. There were no immediate bleeding complications.  IMPRESSION: Ultrasound-guided core biopsy performed of 80 lesion in the right lobe of the liver.   Electronically Signed   By: Irish Lack M.D.   On: 03/30/2013 11:05    Microbiology: No results found for this or any previous visit (from the past 240 hour(s)).   Labs: Basic Metabolic Panel:  Recent Labs Lab 03/28/13 2046 03/28/13 2128 03/29/13 0138  NA 132* 129* 137  K 3.8 3.5 3.3*  CL 91* 86* 94*  CO2  --  31 31  GLUCOSE 566* 524* 238*  BUN 22 19 16   CREATININE 1.30 0.99 0.84  CALCIUM  --  9.8 10.1  MG  --   --  1.7  PHOS  --   --  2.1*   Liver Function Tests:  Recent Labs Lab 03/28/13 2128 03/29/13 0138  AST 46* 48*  ALT 61* 63*  ALKPHOS 351* 373*  BILITOT 0.9 0.8  PROT 6.9 6.9  ALBUMIN 3.6 3.5   No results found for this basename: LIPASE, AMYLASE,  in the last 168 hours  Recent Labs Lab 03/28/13 2128  AMMONIA 28   CBC:  Recent Labs Lab 03/28/13 2018 03/28/13 2046 03/29/13 0138 03/30/13 0808  WBC 11.7*  --  12.1* 10.0  NEUTROABS 9.3*  --   --   --   HGB 15.5 16.3 15.8 14.3  HCT 44.9 48.0 45.6 41.0  MCV 91.8  --  92.1 90.7  PLT 143*  --  140* 113*   Cardiac Enzymes:  Recent Labs Lab 03/29/13 0138 03/29/13 0655 03/29/13 1259  TROPONINI <0.30 <0.30 <0.30   BNP: BNP (last 3 results) No results found for this basename: PROBNP,  in the last 8760 hours CBG:  Recent Labs Lab 03/29/13 1152 03/29/13 1741 03/29/13 2131 03/30/13 0745 03/30/13 1147  GLUCAP 254* 294* 218* 166* 185*       Signed:  Chestina Komatsu  [facility] 03/30/2013, 12:19 PM  Addendum: I requested that CM set patient up with a local provider for hospital follow up and diabetic management. Per CM patient was scheduled hosp f/u apt with Dr. Dewain Penning on 12/18, also notified [facility] that patient is being dc today and will need HHRN for diabetic management.

## 2013-03-30 NOTE — H&P (Signed)
Referring Physician:Dr. Vanessa Barbara HPI: Danny Waters is an 79 y.o. male with PMHx of melanoma follows with Dr. Arbutus Ped. The patient presented to [facility] on 03/28/13 with mental status changes found to have hyperglycemia with BS > 600. The patient had a CT performed 03/29/13 which revealed 5.3 cm pancreatic tail mass, highly suspicious for primary pancreatic neoplasm. Numerous hepatic metastases in both lobes. Request received for image guided liver lesion biopsy. The patient denies any active bleeding. He denies any chest pain or shortness of breath. He denies any fever or chills.    Past Medical History:  Past Medical History  Diagnosis Date  . Hyperlipidemia     takes Lipitor daily  . Hypertension     takes Amlodipine,Metoprolol,and Losartan daily  . Hx of colonic polyps   . Type 2 diabetes mellitus with vascular disease   . Aortic stenosis     Last ECHO Dr. Donnie Aho 09/24/12  AVA 0.76 assymptomatic   . CAD (coronary artery disease) 09/24/2012    Cath 07/04/2002 normal Left main, 50% stenosis proximal LAD, 99% stenosis mid LAD, 50% stenosis mid CFX, 60% stenosis proximal PDA, stent placed mid LAD  Taxus stent to mid LAD 07/04/2002   . Hypertensive heart disease     Past Surgical History:  Past Surgical History  Procedure Laterality Date  . Malignanat melanoma  2003  . Hernia repair  1957  . Appendectomy    . Cardiac catheterization  10+yrs ago  . Coronary stent placement  2004    Taxus stent to mid LAD  . Colonoscopy    . Melanoma excision  06/07/2011    Procedure: MELANOMA EXCISION;  Surgeon: Shelly Rubenstein, MD;  Location: [facility] OR;  Service: General;  Laterality: Right;  wide excision melanoma right shoulder  and left back and left upper arm   . Mastoid debridement      Family History:  Family History  Problem Relation Age of Onset  . Heart disease Mother   . Hypertension Mother   . Anesthesia problems Neg Hx   . Hypotension Neg Hx   . Malignant hyperthermia Neg Hx   . Pseudochol  deficiency Neg Hx   . Hypertension Father     Social History:  reports that he quit smoking about 54 years ago. He has never used smokeless tobacco. He reports that he does not drink alcohol or use illicit drugs.  Allergies: No Known Allergies    Medication List    STOP taking these medications       amLODipine 5 MG tablet  Commonly known as:  NORVASC     atorvastatin 40 MG tablet  Commonly known as:  LIPITOR     losartan-hydrochlorothiazide 100-25 MG per tablet  Commonly known as:  HYZAAR      TAKE these medications       aspirin 81 MG tablet  Take 81 mg by mouth daily.     calcium carbonate 500 MG chewable tablet  Commonly known as:  TUMS - dosed in mg elemental calcium  Chew 1 tablet by mouth daily.     metFORMIN 500 MG tablet  Commonly known as:  GLUCOPHAGE  Take 1 tablet (500 mg total) by mouth 2 (two) times daily with a meal.     metoprolol succinate 100 MG 24 hr tablet  Commonly known as:  TOPROL-XL  Take 100 mg by mouth daily.     multivitamin tablet  Take 1 tablet by mouth daily.     mupirocin ointment 2 %  Commonly known as:  BACTROBAN       Please HPI for pertinent positives, otherwise complete 10 system ROS negative.  Physical Exam: BP 108/65  Pulse 75  Temp(Src) 98.3 F (36.8 C) (Oral)  Resp 17  Ht 5\' 8"  (1.727 m)  Wt 190 lb (86.183 kg)  BMI 28.90 kg/m2  SpO2 91% Body mass index is 28.9 kg/(m^2).  General Appearance:  Alert, cooperative, no distress  Head:  Normocephalic, without obvious abnormality, atraumatic  Neck: Supple, symmetrical, trachea midline  Lungs:   Clear to auscultation bilaterally, no w/r/r, respirations unlabored without use of accessory muscles.  Chest Wall:  No tenderness or deformity  Heart:  Regular rate and rhythm, S1, S2 normal, no murmur, rub or gallop.  Abdomen:   Soft, non-tender, non distended, (+) BS  Extremities: Extremities normal, atraumatic, no cyanosis or edema  Pulses: 1+ and symmetric  Neurologic:  Normal affect, no gross deficits.   Results for orders placed during the hospital encounter of 03/28/13 (from the past 48 hour(s))  CBC WITH DIFFERENTIAL     Status: Abnormal   Collection Time    03/28/13  8:18 PM      Result Value Range   WBC 11.7 (*) 4.0 - 10.5 K/uL   RBC 4.89  4.22 - 5.81 MIL/uL   Hemoglobin 15.5  13.0 - 17.0 g/dL   HCT 16.1  09.6 - 04.5 %   MCV 91.8  78.0 - 100.0 fL   MCH 31.7  26.0 - 34.0 pg   MCHC 34.5  30.0 - 36.0 g/dL   RDW 40.9  81.1 - 91.4 %   Platelets 143 (*) 150 - 400 K/uL   Neutrophils Relative % 79 (*) 43 - 77 %   Neutro Abs 9.3 (*) 1.7 - 7.7 K/uL   Lymphocytes Relative 13  12 - 46 %   Lymphs Abs 1.5  0.7 - 4.0 K/uL   Monocytes Relative 6  3 - 12 %   Monocytes Absolute 0.8  0.1 - 1.0 K/uL   Eosinophils Relative 1  0 - 5 %   Eosinophils Absolute 0.1  0.0 - 0.7 K/uL   Basophils Relative 0  0 - 1 %   Basophils Absolute 0.0  0.0 - 0.1 K/uL  URINALYSIS, ROUTINE W REFLEX MICROSCOPIC     Status: Abnormal   Collection Time    03/28/13  8:40 PM      Result Value Range   Color, Urine YELLOW  YELLOW   APPearance CLOUDY (*) CLEAR   Specific Gravity, Urine 1.035 (*) 1.005 - 1.030   pH 6.0  5.0 - 8.0   Glucose, UA >1000 (*) NEGATIVE mg/dL   Hgb urine dipstick NEGATIVE  NEGATIVE   Bilirubin Urine NEGATIVE  NEGATIVE   Ketones, ur NEGATIVE  NEGATIVE mg/dL   Protein, ur NEGATIVE  NEGATIVE mg/dL   Urobilinogen, UA 1.0  0.0 - 1.0 mg/dL   Nitrite NEGATIVE  NEGATIVE   Leukocytes, UA NEGATIVE  NEGATIVE  URINE MICROSCOPIC-ADD ON     Status: None   Collection Time    03/28/13  8:40 PM      Result Value Range   Urine-Other       Value: NO FORMED ELEMENTS SEEN ON URINE MICROSCOPIC EXAMINATION  POCT I-STAT, CHEM 8     Status: Abnormal   Collection Time    03/28/13  8:46 PM      Result Value Range   Sodium 132 (*) 135 - 145 mEq/L   Potassium  3.8  3.5 - 5.1 mEq/L   Chloride 91 (*) 96 - 112 mEq/L   BUN 22  6 - 23 mg/dL   Creatinine, Ser 1.47  0.50 - 1.35 mg/dL    Glucose, Bld 829 (*) 70 - 99 mg/dL   Calcium, Ion 5.62  1.30 - 1.30 mmol/L   TCO2 31  0 - 100 mmol/L   Hemoglobin 16.3  13.0 - 17.0 g/dL   HCT 86.5  78.4 - 69.6 %   Comment NOTIFIED PHYSICIAN    COMPREHENSIVE METABOLIC PANEL     Status: Abnormal   Collection Time    03/28/13  9:28 PM      Result Value Range   Sodium 129 (*) 135 - 145 mEq/L   Potassium 3.5  3.5 - 5.1 mEq/L   Chloride 86 (*) 96 - 112 mEq/L   CO2 31  19 - 32 mEq/L   Glucose, Bld 524 (*) 70 - 99 mg/dL   BUN 19  6 - 23 mg/dL   Creatinine, Ser 2.95  0.50 - 1.35 mg/dL   Calcium 9.8  8.4 - 28.4 mg/dL   Total Protein 6.9  6.0 - 8.3 g/dL   Albumin 3.6  3.5 - 5.2 g/dL   AST 46 (*) 0 - 37 U/L   ALT 61 (*) 0 - 53 U/L   Alkaline Phosphatase 351 (*) 39 - 117 U/L   Total Bilirubin 0.9  0.3 - 1.2 mg/dL   GFR calc non Af Amer 75 (*) >90 mL/min   GFR calc Af Amer 87 (*) >90 mL/min   Comment: (NOTE)     The eGFR has been calculated using the CKD EPI equation.     This calculation has not been validated in all clinical situations.     eGFR's persistently <90 mL/min signify possible Chronic Kidney     Disease.  AMMONIA     Status: None   Collection Time    03/28/13  9:28 PM      Result Value Range   Ammonia 28  11 - 60 umol/L  GLUCOSE, CAPILLARY     Status: Abnormal   Collection Time    03/28/13 11:07 PM      Result Value Range   Glucose-Capillary 426 (*) 70 - 99 mg/dL  GLUCOSE, CAPILLARY     Status: Abnormal   Collection Time    03/29/13 12:04 AM      Result Value Range   Glucose-Capillary 405 (*) 70 - 99 mg/dL  GLUCOSE, CAPILLARY     Status: Abnormal   Collection Time    03/29/13  1:09 AM      Result Value Range   Glucose-Capillary 318 (*) 70 - 99 mg/dL  MAGNESIUM     Status: None   Collection Time    03/29/13  1:38 AM      Result Value Range   Magnesium 1.7  1.5 - 2.5 mg/dL  PHOSPHORUS     Status: Abnormal   Collection Time    03/29/13  1:38 AM      Result Value Range   Phosphorus 2.1 (*) 2.3 - 4.6 mg/dL   TSH     Status: None   Collection Time    03/29/13  1:38 AM      Result Value Range   TSH 1.993  0.350 - 4.500 uIU/mL   Comment: Performed at [facility]  COMPREHENSIVE METABOLIC PANEL     Status: Abnormal   Collection Time    03/29/13  1:38 AM      Result Value Range   Sodium 137  135 - 145 mEq/L   Comment: DELTA CHECK NOTED   Potassium 3.3 (*) 3.5 - 5.1 mEq/L   Chloride 94 (*) 96 - 112 mEq/L   Comment: DELTA CHECK NOTED   CO2 31  19 - 32 mEq/L   Glucose, Bld 238 (*) 70 - 99 mg/dL   BUN 16  6 - 23 mg/dL   Creatinine, Ser 4.78  0.50 - 1.35 mg/dL   Calcium 29.5  8.4 - 62.1 mg/dL   Total Protein 6.9  6.0 - 8.3 g/dL   Albumin 3.5  3.5 - 5.2 g/dL   AST 48 (*) 0 - 37 U/L   ALT 63 (*) 0 - 53 U/L   Alkaline Phosphatase 373 (*) 39 - 117 U/L   Total Bilirubin 0.8  0.3 - 1.2 mg/dL   GFR calc non Af Amer 80 (*) >90 mL/min   GFR calc Af Amer >90  >90 mL/min   Comment: (NOTE)     The eGFR has been calculated using the CKD EPI equation.     This calculation has not been validated in all clinical situations.     eGFR's persistently <90 mL/min signify possible Chronic Kidney     Disease.  CBC     Status: Abnormal   Collection Time    03/29/13  1:38 AM      Result Value Range   WBC 12.1 (*) 4.0 - 10.5 K/uL   RBC 4.95  4.22 - 5.81 MIL/uL   Hemoglobin 15.8  13.0 - 17.0 g/dL   HCT 30.8  65.7 - 84.6 %   MCV 92.1  78.0 - 100.0 fL   MCH 31.9  26.0 - 34.0 pg   MCHC 34.6  30.0 - 36.0 g/dL   RDW 96.2  95.2 - 84.1 %   Platelets 140 (*) 150 - 400 K/uL  TROPONIN I     Status: None   Collection Time    03/29/13  1:38 AM      Result Value Range   Troponin I <0.30  <0.30 ng/mL   Comment:            Due to the release kinetics of cTnI,     a negative result within the first hours     of the onset of symptoms does not rule out     myocardial infarction with certainty.     If myocardial infarction is still suspected,     repeat the test at appropriate intervals.  HEPATITIS PANEL,  ACUTE     Status: None   Collection Time    03/29/13  1:38 AM      Result Value Range   Hepatitis B Surface Ag NEGATIVE  NEGATIVE   HCV Ab NEGATIVE  NEGATIVE   Hep A IgM NON REACTIVE  NON REACTIVE   Hep B C IgM NON REACTIVE  NON REACTIVE   Comment: (NOTE)     High levels of Hepatitis B Core IgM antibody are detectable     during the acute stage of Hepatitis B. This antibody is used     to differentiate current from past HBV infection.     Performed at [facility]  HEMOGLOBIN A1C     Status: Abnormal   Collection Time    03/29/13  1:38 AM      Result Value Range   Hemoglobin A1C 14.1 (*) <5.7 %   Comment: (NOTE)  According to the ADA Clinical Practice Recommendations for 2011, when     HbA1c is used as a screening test:      >=6.5%   Diagnostic of Diabetes Mellitus               (if abnormal result is confirmed)     5.7-6.4%   Increased risk of developing Diabetes Mellitus     References:Diagnosis and Classification of Diabetes Mellitus,Diabetes     Care,2011,34(Suppl 1):S62-S69 and Standards of Medical Care in             Diabetes - 2011,Diabetes Care,2011,34 (Suppl 1):S11-S61.   Mean Plasma Glucose 358 (*) <117 mg/dL   Comment: Performed at [facility]  GLUCOSE, CAPILLARY     Status: Abnormal   Collection Time    03/29/13  2:05 AM      Result Value Range   Glucose-Capillary 223 (*) 70 - 99 mg/dL  GLUCOSE, CAPILLARY     Status: Abnormal   Collection Time    03/29/13  3:08 AM      Result Value Range   Glucose-Capillary 167 (*) 70 - 99 mg/dL  GLUCOSE, CAPILLARY     Status: Abnormal   Collection Time    03/29/13  4:10 AM      Result Value Range   Glucose-Capillary 136 (*) 70 - 99 mg/dL  GLUCOSE, CAPILLARY     Status: Abnormal   Collection Time    03/29/13  5:13 AM      Result Value Range   Glucose-Capillary 160 (*) 70 - 99 mg/dL  GLUCOSE, CAPILLARY     Status: Abnormal    Collection Time    03/29/13  6:16 AM      Result Value Range   Glucose-Capillary 156 (*) 70 - 99 mg/dL  TROPONIN I     Status: None   Collection Time    03/29/13  6:55 AM      Result Value Range   Troponin I <0.30  <0.30 ng/mL   Comment:            Due to the release kinetics of cTnI,     a negative result within the first hours     of the onset of symptoms does not rule out     myocardial infarction with certainty.     If myocardial infarction is still suspected,     repeat the test at appropriate intervals.  GLUCOSE, CAPILLARY     Status: Abnormal   Collection Time    03/29/13  7:59 AM      Result Value Range   Glucose-Capillary 187 (*) 70 - 99 mg/dL  GLUCOSE, CAPILLARY     Status: Abnormal   Collection Time    03/29/13 11:52 AM      Result Value Range   Glucose-Capillary 254 (*) 70 - 99 mg/dL  TROPONIN I     Status: None   Collection Time    03/29/13 12:59 PM      Result Value Range   Troponin I <0.30  <0.30 ng/mL   Comment:            Due to the release kinetics of cTnI,     a negative result within the first hours     of the onset of symptoms does not rule out     myocardial infarction with certainty.     If myocardial infarction is still suspected,     repeat the test at appropriate intervals.  GLUCOSE,  CAPILLARY     Status: Abnormal   Collection Time    03/29/13  5:41 PM      Result Value Range   Glucose-Capillary 294 (*) 70 - 99 mg/dL  GLUCOSE, CAPILLARY     Status: Abnormal   Collection Time    03/29/13  9:31 PM      Result Value Range   Glucose-Capillary 218 (*) 70 - 99 mg/dL   Comment 1 Notify RN    GLUCOSE, CAPILLARY     Status: Abnormal   Collection Time    03/30/13  7:45 AM      Result Value Range   Glucose-Capillary 166 (*) 70 - 99 mg/dL  CBC     Status: None   Collection Time    03/30/13  8:08 AM      Result Value Range   WBC 10.0  4.0 - 10.5 K/uL   RBC 4.52  4.22 - 5.81 MIL/uL   Hemoglobin 14.3  13.0 - 17.0 g/dL   HCT 16.1  09.6 - 04.5 %    MCV 90.7  78.0 - 100.0 fL   MCH 31.6  26.0 - 34.0 pg   MCHC 34.9  30.0 - 36.0 g/dL   RDW 40.9  81.1 - 91.4 %   Platelets PENDING  150 - 400 K/uL   Dg Chest 2 View  03/28/2013   CLINICAL DATA:  Altered mental status.  Ex-smoker.  EXAM: CHEST  2 VIEW  COMPARISON:  06/05/2011 and chest CT dated 06/19/2011.  FINDINGS: The heart remains normal in size and the aorta remains tortuous. No aneurysm on the previous CT. Interval small linear density at the left lung base. The right lung remains clear with a stable overlying nipple shadow. Thoracic spine degenerative changes, including changes of DISH. Mild scoliosis.  IMPRESSION: Interval minimal linear atelectasis or scarring at the left lung base. Otherwise, unremarkable examination.   Electronically Signed   By: Gordan Payment M.D.   On: 03/28/2013 22:17   Ct Head Wo Contrast  03/29/2013   CLINICAL DATA:  Altered mental status; hyperglycemia.  EXAM: CT HEAD WITHOUT CONTRAST  TECHNIQUE: Contiguous axial images were obtained from the base of the skull through the vertex without intravenous contrast.  COMPARISON:  CT of the head performed 06/19/2011  FINDINGS: There is no evidence of acute infarction, mass lesion, or intra- or extra-axial hemorrhage on CT.  Prominence of the sulci suggests mild cortical volume loss. Mild periventricular white matter change likely reflects small vessel ischemic microangiopathy. Mild cerebellar atrophy is noted.  The posterior fossa, including the cerebellum, brainstem and fourth ventricle, is within normal limits. The third and lateral ventricles, and basal ganglia are unremarkable in appearance. The cerebral hemispheres are symmetric in appearance, with normal gray-white differentiation. No mass effect or midline shift is seen.  There is no evidence of fracture; visualized osseous structures are unremarkable in appearance. The orbits are within normal limits. The paranasal sinuses and mastoid air cells are well-aerated. No  significant soft tissue abnormalities are seen.  IMPRESSION: 1. No acute intracranial pathology seen on CT. 2. Mild cortical volume loss and scattered small vessel ischemic microangiopathy.   Electronically Signed   By: Roanna Raider M.D.   On: 03/29/2013 02:47   Ct Abdomen Pelvis W Contrast  03/29/2013   CLINICAL DATA:  Leukocytosis, elevated alk phos, hyperglycemia  EXAM: CT ABDOMEN AND PELVIS WITH CONTRAST  TECHNIQUE: Multidetector CT imaging of the abdomen and pelvis was performed using the standard protocol following bolus  administration of intravenous contrast.  CONTRAST:  OMNIPAQUE IOHEXOL 300 MG/ML  SOLN  COMPARISON:  06/19/2011  FINDINGS: Mild scarring in the bilateral lower lobes.  Cardiomegaly. Coronary atherosclerosis. Atherosclerotic calcifications of the aortic valve.  2.2 x 2.6 cm probable hemangioma in the posterior segment right hepatic lobe (series 2/ image 26), unchanged. Numerous additional hypoenhancing hepatic lesions in both lobes, new, suspicious for metastases. Dominant lesions include:  --2.8 x 2.6 cm lesion in the posterior right hepatic dome (series 2/ image 20)  --1.5 x 1.4 cm lesion in the lateral segment left hepatic lobe (series 2/image 23)  --2.9 x 2.6 cm lesion in the anterior segment right hepatic lobe (series 2/image 26)  5.3 x 4.4 x 4.6 cm hypoenhancing mass in the pancreatic tail (series 2/ image 29), highly suspicious for primary pancreatic neoplasm.  Possible direct invasion versus metastasis in the splenic hilum (series 2/ image 27).  Mild nodularity of the bilateral adrenal glands with suspected 1.4 cm left adrenal metastasis (series 2/image 33).  Gallbladder is unremarkable. No intrahepatic or extrahepatic ductal dilatation.  Multiple bilateral renal cysts measuring up to 2.2 cm in the posterior right lower pole (series 7/image 20) and 1.2 cm in the left lateral interpolar region (series 7/ image 16). Bilateral renal cortical scarring. No hydronephrosis.  No  evidence of bowel obstruction.  Atherosclerotic calcifications of the abdominal aorta and branch vessels. 3.7 x 3.3 cm infrarenal abdominal aortic aneurysm (series 2/ image 42). Additional fusiform ectasia measuring up to 3.2 x 3.0 cm just above the aortic bifurcation.  No abdominopelvic ascites.  10 mm short axis retroperitoneal noted just above the left renal vein (series 2/image 34).  Prostate is notable for margin of the central gland which indents the base of the bladder.  Bladder is unremarkable.  Degenerative changes of the visualized thoracolumbar spine.  IMPRESSION: 5.3 cm pancreatic tail mass, highly suspicious for primary pancreatic neoplasm.  Numerous hepatic metastases in both lobes, with index lesions as described above.  Direct invasion versus splenic metastasis.  Left adrenal metastasis.  Additional ancillary findings as above.   Electronically Signed   By: Charline Bills M.D.   On: 03/29/2013 13:45    Assessment/Plan History of melanoma. Pancreatic mass on CT Liver lesions on CT Request for image guided liver lesion biopsy. Labs and images reviewed. Patient has been NPO, lovenox last given 12/7 Risks and Benefits discussed with the patient. All of the patient's questions were answered, patient is agreeable to proceed. Consent signed and in chart.   Pattricia Boss D PA-C 03/30/2013, 9:08 AM

## 2013-03-30 NOTE — H&P (Signed)
Agree.  For biopsy of liver lesion under ultrasound today.

## 2013-03-30 NOTE — Progress Notes (Signed)
[name] to be D/C'd Home per MD order.  Discussed with the patient and all questions fully answered.    Medication List    STOP taking these medications       amLODipine 5 MG tablet  Commonly known as:  NORVASC     atorvastatin 40 MG tablet  Commonly known as:  LIPITOR     losartan-hydrochlorothiazide 100-25 MG per tablet  Commonly known as:  HYZAAR      TAKE these medications       aspirin 81 MG tablet  Take 81 mg by mouth daily.     bd getting started take home kit Misc  1 kit by Other route once.     calcium carbonate 500 MG chewable tablet  Commonly known as:  TUMS - dosed in mg elemental calcium  Chew 1 tablet by mouth daily.     insulin glargine 100 UNIT/ML injection  Commonly known as:  LANTUS  Inject 0.15 mLs (15 Units total) into the skin at bedtime.     metFORMIN 500 MG tablet  Commonly known as:  GLUCOPHAGE  Take 1 tablet (500 mg total) by mouth 2 (two) times daily with a meal.     metoprolol succinate 100 MG 24 hr tablet  Commonly known as:  TOPROL-XL  Take 100 mg by mouth daily.     multivitamin tablet  Take 1 tablet by mouth daily.     mupirocin ointment 2 %  Commonly known as:  BACTROBAN        VVS, Skin clean, dry and intact without evidence of skin break down, no evidence of skin tears noted. IV catheter discontinued intact. Site without signs and symptoms of complications. Dressing and pressure applied.  An After Visit Summary was printed and given to the patient.  D/c education completed with patient/family including follow up instructions, medication list, d/c activities limitations if indicated, with other d/c instructions as indicated by MD - patient able to verbalize understanding, all questions fully answered.   Patient instructed to return to ED, call 911, or call MD for any changes in condition.   Patient escorted via WC, and D/C home via private auto.  Laurel Dimmer 03/30/2013 3:27 PM

## 2013-03-30 NOTE — Plan of Care (Signed)
Problem: Consults Goal: Diagnosis-Diabetes Mellitus Outcome: Completed/Met Date Met:  03/30/13 Hyperglycemia

## 2013-03-30 NOTE — Procedures (Signed)
Procedure:  Liver core biopsy of right lobe liver lesion Findings:  Right lobe lesion well visualized.  17 G needle advanced.  18 G core biopsy x 3 yielding solid tissue.  No immediate complications.

## 2013-03-30 NOTE — Progress Notes (Addendum)
Inpatient Diabetes Program Recommendations  AACE/ADA: New Consensus Statement on Inpatient Glycemic Control (2013)  Target Ranges:  Prepandial:   less than 140 mg/dL      Peak postprandial:   less than 180 mg/dL (1-2 hours)      Critically ill patients:  140 - 180 mg/dL   Reason for Visit: Insulin Pen Teaching  79y.o. male with PMHx of melanoma follows with Dr. Arbutus Ped. The patient presented to [facility] on 03/28/13 with mental status changes found to have hyperglycemia with BS > 600.  GlucoStabilizer started and was transitioned to SQ insulin when blood sugars normalized. The patient had a CT performed 03/29/13 which revealed 5.3 cm pancreatic tail mass, highly suspicious for primary pancreatic neoplasm. Numerous hepatic metastases in both lobes. Hx diet controlled DM - He has lost 40 lb over past year intentionally. He has been eating more vegetables an has cut out sweets. Does not have PCP. Has been on metformin 500 mg bid. States he has lost 40 pounds recently.  Results for CASHUS, HALTERMAN (MRN [id number]) as of 03/30/2013 13:45  Ref. Range 03/29/2013 11:52 03/29/2013 17:41 03/29/2013 21:31 03/30/2013 07:45 03/30/2013 11:47  Glucose-Capillary Latest Range: 70-99 mg/dL 409 (H) 811 (H) 914 (H) 166 (H) 185 (H)  Results for YUTA, CIPOLLONE (MRN [id number]) as of 03/30/2013 13:45  Ref. Range 03/29/2013 01:38  Hemoglobin A1C Latest Range: <5.7 % 14.1 (H)  Results for DOMINIC, MAHANEY (MRN [id number]) as of 03/30/2013 13:45  Ref. Range 03/28/2013 21:28 03/29/2013 01:38  Sodium Latest Range: 136-145 mEq/L 129 (L) 137  Potassium Latest Range: 3.5-5.1 mEq/L 3.5 3.3 (L)  Chloride Latest Range: 96-112 mEq/L 86 (L) 94 (L)  CO2 Latest Range: 19-32 mEq/L 31 31  Mean Plasma Glucose Latest Range: <117 mg/dL  629 (H)  BUN Latest Range: 7.0-26.0 mg/dL 19 16  Creatinine Latest Range: 0.50-1.35 mg/dL 5.28 4.13  Calcium Latest Range: 8.4-10.5 mg/dL 9.8 24.4  GFR calc non Af Amer Latest Range: >90 mL/min 75 (L) 80 (L)  GFR calc  Af Amer Latest Range: >90 mL/min 87 (L) >90  Glucose Latest Range: 70-99 mg/dl 010 (H) 272 (H)   Discussed diabetes regarding diet, exercise, importance of monitoring blood sugars at home, and f/u with PCP to manage DM.  Will need prescription for meter at discharge.  Instructed on insulin pen administration and pt was able to do return demonstration. Son was present in room. Discussion regarding hypoglycemia s/s and treatment, importance of rotating sites, insulin use and storage, and recording blood sugars 3-4 times/day. Answered questions and pt seems to be motivated to control diabetes.  Discussed HgbA1C and goal. Encouraged pt to view diabetes videos on pt ed channel. To f/u with new PCP per case management. To be discharged on Lantus 15 units QD.  Thank you. Ailene Ards, RD, LDN, CDE Inpatient Diabetes Coordinator [phone]

## 2013-03-30 NOTE — Care Management Note (Signed)
    Page 1 of 2   03/30/2013     1:25:27 PM   CARE MANAGEMENT NOTE 03/30/2013  Patient:  Danny Waters, Danny Waters   Account Number:  [id number]  Date Initiated:  03/29/2013  Documentation initiated by:  [facility]  Subjective/Objective Assessment:   adm: confusion  PCP : Providence Lanius     Action/Plan:   discharge planning   Anticipated DC Date:  03/30/2013   Anticipated DC Plan:  HOME W HOME HEALTH SERVICES      DC Planning Services  CM consult      [facility] Choice  HOME HEALTH   Choice offered to / List presented to:  C-1 Patient        HH arranged  HH-1 RN  HH-10 DISEASE MANAGEMENT      HH agency  [facility]   Status of service:  Completed, signed off Medicare Important Message given?   (If response is "NO", the following Medicare IM given date fields will be blank) Date Medicare IM given:   Date Additional Medicare IM given:    Discharge Disposition:  HOME W HOME HEALTH SERVICES  Per UR Regulation:  Reviewed for med. necessity/level of care/duration of stay  If discussed at Long Length of Stay Meetings, dates discussed:    Comments:  03/30/13 13:23 Letha Cape RN, BSN [phone] patient for dc today, scheduled hosp f/u apt with Dr. Dewain Penning on 12/18, also notified [facility] that patient is being dc today and will need HHRN for diabetic management.  03/29/13 09:15 CM spoke with pt in room to offer choice. Pt states his girlfriend will probably answering phone and "that's okay."  Pt chooses [facility] for [facility] for disease mgmt of Diabetes (accu checks and nutrition eval).  Address and contact numbers were confirmed.   Referral faxed to [facility] for [facility]  No other CM needs were communicated.  Freddy Jaksch, BSN, CM [phone].

## 2013-03-30 NOTE — Clinical Documentation Improvement (Signed)
  Per chart patient admitted with "confusion" and "altered mental status". In the Coding world these terms are considered nonspecific and low weighted. If possible please clarify in Notes and DC summary to illustrate this patient's Severity of Illness and Risk of Mortality. Thank you.  Possible Clinical Conditions?  - Encephalopathy (describe type if known)                       Anoxic                       Septic                       Alcoholic                        Hepatic                       Hypertensive                       Metabolic                       Toxic - Hyponatremia / Hypernatremia - Poisoning / Overdose - Other Condition (please specify)  Supporting Information: - Risk Factors: Diet controlled DM2 with blood sugar >600 on admission - Treatment: IV Insulin  Thank You, Beverley Fiedler ,RN Clinical Documentation Specialist:  [phone]  [facility]- Health Information Management

## 2013-03-31 ENCOUNTER — Telehealth: Payer: Self-pay | Admitting: Internal Medicine

## 2013-03-31 MED ORDER — FREESTYLE SYSTEM KIT: 1.0000 | PACK | Status: DC | PRN

## 2013-03-31 NOTE — Telephone Encounter (Signed)
lvm for pt regarding to 12.15.14 appt.Marland KitchenMarland KitchenMarland Kitchen

## 2013-04-06 ENCOUNTER — Telehealth: Payer: Self-pay | Admitting: Internal Medicine

## 2013-04-06 ENCOUNTER — Ambulatory Visit (HOSPITAL_BASED_OUTPATIENT_CLINIC_OR_DEPARTMENT_OTHER): Payer: Medicare Other | Admitting: Internal Medicine

## 2013-04-06 ENCOUNTER — Other Ambulatory Visit (HOSPITAL_BASED_OUTPATIENT_CLINIC_OR_DEPARTMENT_OTHER): Payer: Medicare Other

## 2013-04-06 ENCOUNTER — Encounter: Payer: Self-pay | Admitting: Internal Medicine

## 2013-04-06 ENCOUNTER — Telehealth: Payer: Self-pay | Admitting: *Deleted

## 2013-04-06 VITALS — BP 123/83 | HR 61 | Temp 97.0°F | Resp 18 | Ht 68.0 in | Wt 178.7 lb

## 2013-04-06 DIAGNOSIS — C252 Malignant neoplasm of tail of pancreas: Secondary | ICD-10-CM

## 2013-04-06 DIAGNOSIS — C7970 Secondary malignant neoplasm of unspecified adrenal gland: Secondary | ICD-10-CM

## 2013-04-06 DIAGNOSIS — C4360 Malignant melanoma of unspecified upper limb, including shoulder: Secondary | ICD-10-CM

## 2013-04-06 DIAGNOSIS — C251 Malignant neoplasm of body of pancreas: Secondary | ICD-10-CM

## 2013-04-06 DIAGNOSIS — C439 Malignant melanoma of skin, unspecified: Secondary | ICD-10-CM

## 2013-04-06 DIAGNOSIS — C7800 Secondary malignant neoplasm of unspecified lung: Secondary | ICD-10-CM

## 2013-04-06 DIAGNOSIS — C4359 Malignant melanoma of other part of trunk: Secondary | ICD-10-CM

## 2013-04-06 DIAGNOSIS — C259 Malignant neoplasm of pancreas, unspecified: Secondary | ICD-10-CM | POA: Insufficient documentation

## 2013-04-06 DIAGNOSIS — C787 Secondary malignant neoplasm of liver and intrahepatic bile duct: Secondary | ICD-10-CM

## 2013-04-06 LAB — COMPREHENSIVE METABOLIC PANEL (CC13)
AST: 35 U/L — ABNORMAL HIGH (ref 5–34)
Albumin: 3.7 g/dL (ref 3.5–5.0)
Alkaline Phosphatase: 434 U/L — ABNORMAL HIGH (ref 40–150)
Anion Gap: 14 mEq/L — ABNORMAL HIGH (ref 3–11)
BUN: 13.3 mg/dL (ref 7.0–26.0)
Chloride: 97 mEq/L — ABNORMAL LOW (ref 98–109)
Glucose: 174 mg/dl — ABNORMAL HIGH (ref 70–140)
Sodium: 141 mEq/L (ref 136–145)
Total Bilirubin: 1.8 mg/dL — ABNORMAL HIGH (ref 0.20–1.20)
Total Protein: 7.1 g/dL (ref 6.4–8.3)

## 2013-04-06 LAB — CBC WITH DIFFERENTIAL/PLATELET
Basophils Absolute: 0.1 10*3/uL (ref 0.0–0.1)
EOS%: 0.8 % (ref 0.0–7.0)
Eosinophils Absolute: 0.1 10*3/uL (ref 0.0–0.5)
HGB: 15.6 g/dL (ref 13.0–17.1)
LYMPH%: 14.1 % (ref 14.0–49.0)
MCH: 31.1 pg (ref 27.2–33.4)
MCHC: 33.2 g/dL (ref 32.0–36.0)
MCV: 93.8 fL (ref 79.3–98.0)
MONO%: 5.7 % (ref 0.0–14.0)
NEUT#: 10 10*3/uL — ABNORMAL HIGH (ref 1.5–6.5)
Platelets: 148 10*3/uL (ref 140–400)
RBC: 5.02 10*6/uL (ref 4.20–5.82)
RDW: 14.4 % (ref 11.0–14.6)

## 2013-04-06 MED ORDER — LIDOCAINE-PRILOCAINE 2.5-2.5 % EX CREA: 1.0000 "application " | TOPICAL_CREAM | CUTANEOUS | Status: DC | PRN

## 2013-04-06 MED ORDER — PROCHLORPERAZINE MALEATE 10 MG PO TABS: 10.0000 mg | ORAL_TABLET | Freq: Four times a day (QID) | ORAL | Status: DC | PRN

## 2013-04-06 NOTE — Telephone Encounter (Signed)
Per staff message and POF I have scheduled appts.  JMW  

## 2013-04-06 NOTE — Telephone Encounter (Signed)
Gave pt appt for lab and MD, eamiled Michelle reagrding chemo, chemo class on 04/13/13

## 2013-04-06 NOTE — Progress Notes (Signed)
[facility] [phone]   Fax:([phone]) [phone]  OFFICE PROGRESS NOTE  Herb Grays, MD [address] [place] [address]  DIAGNOSIS:  1) Metastatic pancreatic adenocarcinoma presented with large mass in the care of the pancreas was metastatic liver lesions diagnosed in December of 2014. 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 Magnus Ivan.   CURRENT THERAPY: Systemic chemotherapy with gemcitabine 1000 mg/M2 and Abraxane 125 mg/M2 on days, 1, 8, and 15 every 4 weeks. First dose expected on 04/21/2013.  INTERVAL HISTORY: Danny Waters 79 y.o. male returns to the clinic today for followup visit accompanied by 6 family members including his son. The patient was recently admitted to [facility] on 03/28/2013 with confusion and hyperglycemia. CT scan of the head at that time showed no acute intracranial pathology. The patient was noted to have elevated alkaline phosphatase and leukocytosis. CT scan of the abdomen and pelvis was also performed on 03/29/2013 and it showed 2.2 x 2.6 cm probable hemangioma in the posterior segment right  hepatic lobe (series 2/ image 26), unchanged. Numerous additional hypoenhancing hepatic lesions in both lobes, new, suspicious for  metastases. Dominant lesions include: 2.8 x 2.6 cm lesion in the posterior right hepatic dome, 1.5 x 1.4 cm lesion in the lateral segment left hepatic lobe, 2.9 x 2.6 cm lesion in the anterior segment right hepatic lobe, 5.3 x 4.4 x 4.6 cm hypoenhancing mass in the pancreatic tail, highly suspicious for primary pancreatic neoplasm. Possible direct invasion versus metastasis in the splenic hilum. Mild nodularity of the bilateral adrenal  glands with suspected 1.4 cm left adrenal metastasis. Gallbladder is unremarkable. No intrahepatic or extrahepatic ductal dilatation. CT scan of the chest was performed on 03/30/2013 and it showed 6 mm peripheral right middle lobe nodule, nonspecific, but given the findings in the abdomen is concerning for a metastatic focus. On 03/30/2013 the patient underwent ultrasound-guided core biopsy of one of the liver lesions and the final pathology (Accession: [id number]) showed metastatic adenocarcinoma. The history of malignant melanoma is noted ([id number]). There are no diagnostic features of melanoma present. The needle biopsies demonstrate near total replacement by metastatic adenocarcinoma with broad zones of tumor necrosis. Given the clinical impression of a pancreatic mass, the morphologic features are consistent with metastatic primary pancreatic adenocarcinoma. CEA 19-9 on 03/30/2013 was over 140,000. The patient was referred back to me for evaluation and recommendation regarding his recently diagnosed metastatic adenocarcinoma. When seen today he continues to complain of increasing fatigue and weakness. He has confusion that time especially with his uncontrolled diabetes mellitus and hyperglycemia.  He denied having any significant chest pain, shortness of breath, cough or hemoptysis. The patient denied having any nausea or vomiting, no abdominal pain, no diarrhea or constipation. He denied having any back pain. The patient denied having any weight loss or night sweats. He denied having any recent jaundice.  His past medical history as well as family history and social history were reviewed today.  MEDICAL HISTORY: Past Medical History  Diagnosis Date  . Hyperlipidemia     takes Lipitor daily  . Hypertension     takes Amlodipine,Metoprolol,and Losartan daily  . Hx of  colonic polyps   . Type 2 diabetes mellitus with vascular disease   . Aortic stenosis     Last ECHO Dr. Donnie Aho 09/24/12  AVA  0.76 assymptomatic   . CAD (coronary artery disease) 09/24/2012    Cath 07/04/2002 normal Left main, 50% stenosis proximal LAD, 99% stenosis mid LAD, 50% stenosis mid CFX, 60% stenosis proximal PDA, stent placed mid LAD  Taxus stent to mid LAD 07/04/2002   . Hypertensive heart disease     ALLERGIES:  has No Known Allergies.  MEDICATIONS:  Current Outpatient Prescriptions  Medication Sig Dispense Refill  . aspirin 81 MG tablet Take 81 mg by mouth daily.       . bd getting started take home kit MISC 1 kit by Other route once.  100 each  1  . glucose monitoring kit (FREESTYLE) monitoring kit 1 each by Does not apply route as needed for other.  1 each  0  . glucose monitoring kit (FREESTYLE) monitoring kit 1 each by Does not apply route as needed for other.  1 each  0  . insulin glargine (LANTUS) 100 UNIT/ML injection Inject 0.15 mLs (15 Units total) into the skin at bedtime.  10 mL  3  . Multiple Vitamin (MULTIVITAMIN) tablet Take 1 tablet by mouth daily.      . mupirocin ointment (BACTROBAN) 2 %       . lidocaine-prilocaine (EMLA) cream Apply 1 application topically as needed.  30 g  0  . metFORMIN (GLUCOPHAGE) 500 MG tablet Take 1 tablet (500 mg total) by mouth 2 (two) times daily with a meal.  30 tablet  0  . metoprolol succinate (TOPROL-XL) 100 MG 24 hr tablet Take 100 mg by mouth daily.       . prochlorperazine (COMPAZINE) 10 MG tablet Take 1 tablet (10 mg total) by mouth every 6 (six) hours as needed for nausea or vomiting.  60 tablet  0   No current facility-administered medications for this visit.    SURGICAL HISTORY:  Past Surgical History  Procedure Laterality Date  . Malignanat melanoma  2003  . Hernia repair  1957  . Appendectomy    . Cardiac catheterization  10+yrs ago  . Coronary stent placement  2004    Taxus stent to mid LAD  . Colonoscopy    . Melanoma excision  06/07/2011    Procedure: MELANOMA EXCISION;  Surgeon: Shelly Rubenstein, MD;  Location: [facility] OR;  Service:  General;  Laterality: Right;  wide excision melanoma right shoulder  and left back and left upper arm   . Mastoid debridement      REVIEW OF SYSTEMS:  Constitutional: positive for anorexia and fatigue Eyes: negative Ears, nose, mouth, throat, and face: negative Respiratory: negative Cardiovascular: negative Gastrointestinal: negative Genitourinary:negative Integument/breast: negative Hematologic/lymphatic: negative Musculoskeletal:negative Neurological: negative Behavioral/Psych: negative Endocrine: negative Allergic/Immunologic: negative   PHYSICAL EXAMINATION: General appearance: alert, cooperative, fatigued and no distress Head: Normocephalic, without obvious abnormality, atraumatic Neck: no adenopathy, no JVD, supple, symmetrical, trachea midline and thyroid not enlarged, symmetric, no tenderness/mass/nodules Lymph nodes: Cervical, supraclavicular, and axillary nodes normal. Resp: clear to auscultation bilaterally Back: symmetric, no curvature. ROM normal. No CVA tenderness. Cardio: regular rate and rhythm, S1, S2 normal, no murmur, click, rub or gallop GI: soft, non-tender; bowel sounds normal; no masses,  no organomegaly Extremities: extremities normal, atraumatic, no cyanosis or edema Neurologic: Alert and oriented X 3, normal strength and tone. Normal symmetric reflexes. Normal coordination and gait  ECOG PERFORMANCE STATUS:  1 - Symptomatic but completely ambulatory  Blood pressure 123/83, pulse 61, temperature 97 F (36.1 C), resp. rate 18, height 5\' 8"  (1.727 m), weight 178 lb 11.2 oz (81.058 kg), SpO2 98.00%.  LABORATORY DATA: Lab Results  Component Value Date   WBC 12.7* 04/06/2013   HGB 15.6 04/06/2013   HCT 47.1 04/06/2013   MCV 93.8 04/06/2013   PLT 148 04/06/2013      Chemistry      Component Value Date/Time   NA 141 04/06/2013 1259   NA 137 03/29/2013 0138   K 4.1 04/06/2013 1259   K 3.3* 03/29/2013 0138   CL 94* 03/29/2013 0138   CL 94* 06/25/2012  1008   CO2 29 04/06/2013 1259   CO2 31 03/29/2013 0138   BUN 13.3 04/06/2013 1259   BUN 16 03/29/2013 0138   CREATININE 1.2 04/06/2013 1259   CREATININE 0.84 03/29/2013 0138      Component Value Date/Time   CALCIUM 11.5* 04/06/2013 1259   CALCIUM 10.1 03/29/2013 0138   ALKPHOS 434* 04/06/2013 1259   ALKPHOS 373* 03/29/2013 0138   AST 35* 04/06/2013 1259   AST 48* 03/29/2013 0138   ALT 47 04/06/2013 1259   ALT 63* 03/29/2013 0138   BILITOT 1.80* 04/06/2013 1259   BILITOT 0.8 03/29/2013 0138       RADIOGRAPHIC STUDIES: Dg Chest 2 View  03/28/2013   CLINICAL DATA:  Altered mental status.  Ex-smoker.  EXAM: CHEST  2 VIEW  COMPARISON:  06/05/2011 and chest CT dated 06/19/2011.  FINDINGS: The heart remains normal in size and the aorta remains tortuous. No aneurysm on the previous CT. Interval small linear density at the left lung base. The right lung remains clear with a stable overlying nipple shadow. Thoracic spine degenerative changes, including changes of DISH. Mild scoliosis.  IMPRESSION: Interval minimal linear atelectasis or scarring at the left lung base. Otherwise, unremarkable examination.   Electronically Signed   By: Gordan Payment M.D.   On: 03/28/2013 22:17   Ct Head Wo Contrast  03/29/2013   CLINICAL DATA:  Altered mental status; hyperglycemia.  EXAM: CT HEAD WITHOUT CONTRAST  TECHNIQUE: Contiguous axial images were obtained from the base of the skull through the vertex without intravenous contrast.  COMPARISON:  CT of the head performed 06/19/2011  FINDINGS: There is no evidence of acute infarction, mass lesion, or intra- or extra-axial hemorrhage on CT.  Prominence of the sulci suggests mild cortical volume loss. Mild periventricular white matter change likely reflects small vessel ischemic microangiopathy. Mild cerebellar atrophy is noted.  The posterior fossa, including the cerebellum, brainstem and fourth ventricle, is within normal limits. The third and lateral ventricles, and basal  ganglia are unremarkable in appearance. The cerebral hemispheres are symmetric in appearance, with normal gray-white differentiation. No mass effect or midline shift is seen.  There is no evidence of fracture; visualized osseous structures are unremarkable in appearance. The orbits are within normal limits. The paranasal sinuses and mastoid air cells are well-aerated. No significant soft tissue abnormalities are seen.  IMPRESSION: 1. No acute intracranial pathology seen on CT. 2. Mild cortical volume loss and scattered small vessel ischemic microangiopathy.   Electronically Signed   By: Roanna Raider M.D.   On: 03/29/2013 02:47   Ct Chest W Contrast  03/30/2013   CLINICAL DATA:  Questionable metastatic disease to the lungs. History of malignant melanoma.  EXAM: CT CHEST WITH CONTRAST  TECHNIQUE: Multidetector CT imaging of the chest was performed during intravenous contrast  administration.  CONTRAST:  80mL OMNIPAQUE IOHEXOL 300 MG/ML  SOLN  COMPARISON:  06/19/2011  FINDINGS: Heart is upper limits normal in size. Dense coronary artery calcifications throughout all 3 major coronary vessels. Aorta is ectatic and tortuous, non aneurysmal. Moderate atherosclerotic calcifications/disease throughout the aorta.  No mediastinal, hilar, or axillary adenopathy. Chest wall soft tissues are unremarkable.  6 mm nodule in the inferior peripheral right middle lobe on image 33 of series 3. This is new since prior study. No suspicious pulmonary nodules on the left. Linear areas of scarring in the left lower lobe. No pleural effusions.  Imaging into the upper abdomen demonstrates numerous low-density lesions throughout the liver, increased significantly since prior study compatible with metastatic disease. Index right hepatic lesion on image 45 has a maximum diameter of 2.7 cm. Most of these are new since prior study. There is a hypodensity noted posteriorly in the right hepatic lobe which was seen previously measuring 2.5 cm.  This currently measures 2.7 cm.  There is a mass adjacent to the pancreatic tail and extending to the splenic hilum. This measures 5.0 x 4.7 cm on image 50. This is concerning for a pancreatic tail lesion, presumably metastasis.  No acute or focal bone lesion. Degenerative changes throughout the thoracic spine.  IMPRESSION: 6 mm peripheral right middle lobe nodule, nonspecific, but given the findings in the abdomen is concerning for a metastatic focus.  Numerous low-density ectatic lesions, significantly increased since prior study concerning for metastases.  Soft tissue mass in the region of the pancreatic tail and extending to the splenic hilum concerning for pancreatic tail lesion, presumably metastasis.  Coronary artery disease.   Electronically Signed   By: Charlett Nose M.D.   On: 03/30/2013 16:40   Ct Abdomen Pelvis W Contrast  03/29/2013   CLINICAL DATA:  Leukocytosis, elevated alk phos, hyperglycemia  EXAM: CT ABDOMEN AND PELVIS WITH CONTRAST  TECHNIQUE: Multidetector CT imaging of the abdomen and pelvis was performed using the standard protocol following bolus administration of intravenous contrast.  CONTRAST:  OMNIPAQUE IOHEXOL 300 MG/ML  SOLN  COMPARISON:  06/19/2011  FINDINGS: Mild scarring in the bilateral lower lobes.  Cardiomegaly. Coronary atherosclerosis. Atherosclerotic calcifications of the aortic valve.  2.2 x 2.6 cm probable hemangioma in the posterior segment right hepatic lobe (series 2/ image 26), unchanged. Numerous additional hypoenhancing hepatic lesions in both lobes, new, suspicious for metastases. Dominant lesions include:  --2.8 x 2.6 cm lesion in the posterior right hepatic dome (series 2/ image 20)  --1.5 x 1.4 cm lesion in the lateral segment left hepatic lobe (series 2/image 23)  --2.9 x 2.6 cm lesion in the anterior segment right hepatic lobe (series 2/image 26)  5.3 x 4.4 x 4.6 cm hypoenhancing mass in the pancreatic tail (series 2/ image 29), highly suspicious for  primary pancreatic neoplasm.  Possible direct invasion versus metastasis in the splenic hilum (series 2/ image 27).  Mild nodularity of the bilateral adrenal glands with suspected 1.4 cm left adrenal metastasis (series 2/image 33).  Gallbladder is unremarkable. No intrahepatic or extrahepatic ductal dilatation.  Multiple bilateral renal cysts measuring up to 2.2 cm in the posterior right lower pole (series 7/image 20) and 1.2 cm in the left lateral interpolar region (series 7/ image 16). Bilateral renal cortical scarring. No hydronephrosis.  No evidence of bowel obstruction.  Atherosclerotic calcifications of the abdominal aorta and branch vessels. 3.7 x 3.3 cm infrarenal abdominal aortic aneurysm (series 2/ image 42). Additional fusiform ectasia measuring up to  3.2 x 3.0 cm just above the aortic bifurcation.  No abdominopelvic ascites.  10 mm short axis retroperitoneal noted just above the left renal vein (series 2/image 34).  Prostate is notable for margin of the central gland which indents the base of the bladder.  Bladder is unremarkable.  Degenerative changes of the visualized thoracolumbar spine.  IMPRESSION: 5.3 cm pancreatic tail mass, highly suspicious for primary pancreatic neoplasm.  Numerous hepatic metastases in both lobes, with index lesions as described above.  Direct invasion versus splenic metastasis.  Left adrenal metastasis.  Additional ancillary findings as above.   Electronically Signed   By: Charline Bills M.D.   On: 03/29/2013 13:45   US Biopsy  03/30/2013   CLINICAL DATA:  Multiple hepatic mass lesions and 5 cm mass of the tail of the pancreas. History of melanoma. The patient presents for biopsy of one of the hepatic masses.  EXAM: ULTRASOUND GUIDED CORE BIOPSY OF LIVER  MEDICATIONS: 2.0 mg IV Versed; 50 mcg IV Fentanyl  Total Moderate Sedation Time: 13 min.  PROCEDURE: The procedure, risks, benefits, and alternatives were explained to the patient. Questions regarding the procedure  were encouraged and answered. The patient understands and consents to the procedure.  The right abdominal wall was prepped with Betadine in a sterile fashion, and a sterile drape was applied covering the operative field. A sterile gown and sterile gloves were used for the procedure. Local anesthesia was provided with 1% Lidocaine.  Ultrasound was used to localize lesions in the liver. A lesion in the right lobe was chosen for sampling. Under direct ultrasound guidance, a 17 gauge needle was advanced to the lateral margin of the lesion. Core biopsy was performed with an 18 gauge device. A total of 3 samples were obtained and submitted in formalin. The outer needle was then removed and additional ultrasound performed.  COMPLICATIONS: None.  FINDINGS: The largest mass lesion in the right lobe of the liver roughly at the same level as the gallbladder was well visualized by ultrasound. Solid tissue was obtained from the lesion. There were no immediate bleeding complications.  IMPRESSION: Ultrasound-guided core biopsy performed of 80 lesion in the right lobe of the liver.   Electronically Signed   By: Irish Lack M.D.   On: 03/30/2013 11:05    ASSESSMENT AND PLAN: This is a very pleasant 79 years old white male with history of a stage II malignant melanoma of the left arm status post excision and recently diagnosed with metastatic pancreatic adenocarcinoma with a large mass in the tail of the pancreas and several metastatic liver lesions as well as adrenal and pulmonary metastasis. I have a lengthy discussion with the patient and his family today about his current disease stage, prognosis and treatment options. I gave the patient the option of palliative care and hospice referral versus consideration of systemic chemotherapy. The patient is interested in considering systemic therapy. I discussed with him several options for his treatment including treatment with FOLFIRINOX but I doubt the patient would be able  to handle this aggressive treatment and the adverse effects. I also given him the option of treatment with gemcitabine and Abraxane or a single agent gemcitabine. I discussed with the patient adverse effect of this treatment including but not limited to alopecia, myelosuppression, nausea and vomiting, peripheral neuropathy, liver or renal dysfunction. The patient is interested in treatment with gemcitabine and Abraxane. He would be treated with gemcitabine 1000 mg/M2 and Abraxane 125 mg/M2 on days 1, 8, 15 every  4 weeks. I may have to adjust his dose down depending on the tolerance of the patient during the first cycle. The patient is expected to start the first cycle of this treatment on 04/21/2013 after he enjoys the Christmas holiday with his family. He would have a chemotherapy education class before the first cycle of his treatment. I will also arrange for the patient to have a Port-A-Cath placed by interventional radiology before his first dose of chemotherapy. I will call his pharmacy with prescription for Compazine 10 mg by mouth every 6 hours as needed for nausea in addition to Emla cream to be applied to the Port-A-Cath site before chemotherapy. I gave the patient and his family the time to ask questions and I answered them completely to their satisfactions. The patient was advised to call immediately if he has any concerning symptoms in the interval. The patient voices understanding of current disease status and treatment options and is in agreement with the current care plan.  All questions were answered. The patient knows to call the clinic with any problems, questions or concerns. We can certainly see the patient much sooner if necessary.  I spent 40 minutes counseling the patient face to face. The total time spent in the appointment was 55 minutes.

## 2013-04-06 NOTE — Patient Instructions (Signed)
You are recently diagnosed with metastatic pancreatic adenocarcinoma. We discussed your treatment options including palliative care and hospice referral versus systemic chemotherapy. He would like to proceed with systemic chemotherapy with Abraxane and gemcitabine. First dose on 04/21/2013. Follow up visit at that time.

## 2013-04-07 ENCOUNTER — Telehealth: Payer: Self-pay | Admitting: Internal Medicine

## 2013-04-07 ENCOUNTER — Other Ambulatory Visit (HOSPITAL_COMMUNITY): Payer: Self-pay | Admitting: Radiology

## 2013-04-07 ENCOUNTER — Telehealth: Payer: Self-pay | Admitting: *Deleted

## 2013-04-07 ENCOUNTER — Other Ambulatory Visit: Payer: Self-pay | Admitting: Radiology

## 2013-04-07 NOTE — Telephone Encounter (Signed)
Received call from Caitlyn at [facility] regarding patient.  She states his bl sugar has been in the 250's and she wanted to know whether patient was supposed to be on metformin.  He is getting est with a PCP on Thursday 12/18.  Per Dr Donnald Garre, he is not overseeing his diabetes and will need to f/u with his PCP on Thursday.  Left msg with information.  SLJ

## 2013-04-07 NOTE — Telephone Encounter (Signed)
Talked to pt and gave him appts for december , advised him to get appt calendar on 12/2

## 2013-04-09 ENCOUNTER — Encounter (HOSPITAL_COMMUNITY): Payer: Self-pay | Admitting: Pharmacy Technician

## 2013-04-10 ENCOUNTER — Other Ambulatory Visit: Payer: Self-pay | Admitting: Internal Medicine

## 2013-04-10 ENCOUNTER — Ambulatory Visit (HOSPITAL_COMMUNITY)
Admission: RE | Admit: 2013-04-10 | Discharge: 2013-04-10 | Disposition: A | Discharge status: Home / Self Care | Payer: Medicare Other | Source: Ambulatory Visit | Attending: Internal Medicine | Admitting: Internal Medicine

## 2013-04-10 ENCOUNTER — Encounter (HOSPITAL_COMMUNITY): Payer: Self-pay

## 2013-04-10 DIAGNOSIS — I2510 Atherosclerotic heart disease of native coronary artery without angina pectoris: Secondary | ICD-10-CM | POA: Insufficient documentation

## 2013-04-10 DIAGNOSIS — Z87891 Personal history of nicotine dependence: Secondary | ICD-10-CM | POA: Insufficient documentation

## 2013-04-10 DIAGNOSIS — Z794 Long term (current) use of insulin: Secondary | ICD-10-CM | POA: Insufficient documentation

## 2013-04-10 DIAGNOSIS — E785 Hyperlipidemia, unspecified: Secondary | ICD-10-CM | POA: Insufficient documentation

## 2013-04-10 DIAGNOSIS — I10 Essential (primary) hypertension: Secondary | ICD-10-CM | POA: Insufficient documentation

## 2013-04-10 DIAGNOSIS — Z79899 Other long term (current) drug therapy: Secondary | ICD-10-CM | POA: Insufficient documentation

## 2013-04-10 DIAGNOSIS — E119 Type 2 diabetes mellitus without complications: Secondary | ICD-10-CM | POA: Insufficient documentation

## 2013-04-10 DIAGNOSIS — C259 Malignant neoplasm of pancreas, unspecified: Secondary | ICD-10-CM

## 2013-04-10 LAB — GLUCOSE, CAPILLARY: Glucose-Capillary: 148 mg/dL — ABNORMAL HIGH (ref 70–99)

## 2013-04-10 LAB — CBC
HCT: 44.5 % (ref 39.0–52.0)
Hemoglobin: 15.1 g/dL (ref 13.0–17.0)
RBC: 4.81 MIL/uL (ref 4.22–5.81)
WBC: 12.4 10*3/uL — ABNORMAL HIGH (ref 4.0–10.5)

## 2013-04-10 LAB — BASIC METABOLIC PANEL
BUN: 18 mg/dL (ref 6–23)
Calcium: 10.9 mg/dL — ABNORMAL HIGH (ref 8.4–10.5)
Chloride: 94 mEq/L — ABNORMAL LOW (ref 96–112)
GFR calc Af Amer: 71 mL/min — ABNORMAL LOW (ref >90)
Potassium: 3.3 mEq/L — ABNORMAL LOW (ref 3.5–5.1)

## 2013-04-10 LAB — APTT: aPTT: 26 seconds (ref 24–37)

## 2013-04-10 LAB — PROTIME-INR: INR: 1.17 (ref 0.00–1.49)

## 2013-04-10 MED ORDER — MIDAZOLAM HCL 2 MG/2ML IJ SOLN
INTRAMUSCULAR | Status: AC
  Filled 2013-04-10: qty 4

## 2013-04-10 MED ORDER — FENTANYL CITRATE 0.05 MG/ML IJ SOLN
INTRAMUSCULAR | Status: AC | PRN
  Administered 2013-04-10: 100 ug via INTRAVENOUS

## 2013-04-10 MED ORDER — MIDAZOLAM HCL 2 MG/2ML IJ SOLN
INTRAMUSCULAR | Status: AC | PRN
  Administered 2013-04-10 (×2): 1 mg via INTRAVENOUS

## 2013-04-10 MED ORDER — LIDOCAINE HCL 1 % IJ SOLN
INTRAMUSCULAR | Status: AC
  Filled 2013-04-10: qty 20

## 2013-04-10 MED ORDER — HEPARIN SOD (PORK) LOCK FLUSH 100 UNIT/ML IV SOLN
500.0000 [IU] | Freq: Once | INTRAVENOUS | Status: AC
  Administered 2013-04-10: 500 [IU] via INTRAVENOUS

## 2013-04-10 MED ORDER — SODIUM CHLORIDE 0.9 % IV SOLN
Freq: Once | INTRAVENOUS | Status: AC
  Administered 2013-04-10: 12:00:00 via INTRAVENOUS

## 2013-04-10 MED ORDER — CEFAZOLIN SODIUM-DEXTROSE 2-3 GM-% IV SOLR
2.0000 g | Freq: Once | INTRAVENOUS | Status: AC
  Administered 2013-04-10: 2 g via INTRAVENOUS
  Filled 2013-04-10: qty 50

## 2013-04-10 MED ORDER — HEPARIN SOD (PORK) LOCK FLUSH 100 UNIT/ML IV SOLN
INTRAVENOUS | Status: AC
  Filled 2013-04-10: qty 5

## 2013-04-10 MED ORDER — FENTANYL CITRATE 0.05 MG/ML IJ SOLN
INTRAMUSCULAR | Status: AC
  Filled 2013-04-10: qty 4

## 2013-04-10 NOTE — Procedures (Signed)
Procedure:  Right IJ porta-cath placement Findings:  Cath tip at cavoatrial junction.  OK to use.

## 2013-04-10 NOTE — H&P (Signed)
Agree.  Patient seen.  For port placement today. 

## 2013-04-10 NOTE — H&P (Signed)
Danny Waters is an 79 y.o. male.   Chief Complaint: "I'm getting a port a cath" HPI: Patient with history of metastatic pancreatic carcinoma and stage IIC melanoma presents today for port a cath placement for chemotherapy.  Past Medical History  Diagnosis Date  . Hyperlipidemia     takes Lipitor daily  . Hypertension     takes Amlodipine,Metoprolol,and Losartan daily  . Hx of colonic polyps   . Type 2 diabetes mellitus with vascular disease   . Aortic stenosis     Last ECHO Dr. Donnie Aho 09/24/12  AVA 0.76 assymptomatic   . CAD (coronary artery disease) 09/24/2012    Cath 07/04/2002 normal Left main, 50% stenosis proximal LAD, 99% stenosis mid LAD, 50% stenosis mid CFX, 60% stenosis proximal PDA, stent placed mid LAD  Taxus stent to mid LAD 07/04/2002   . Hypertensive heart disease     Past Surgical History  Procedure Laterality Date  . Malignanat melanoma  2003  . Hernia repair  1957  . Appendectomy    . Cardiac catheterization  10+yrs ago  . Coronary stent placement  2004    Taxus stent to mid LAD  . Colonoscopy    . Melanoma excision  06/07/2011    Procedure: MELANOMA EXCISION;  Surgeon: Shelly Rubenstein, MD;  Location: [facility] OR;  Service: General;  Laterality: Right;  wide excision melanoma right shoulder  and left back and left upper arm   . Mastoid debridement      Family History  Problem Relation Age of Onset  . Heart disease Mother   . Hypertension Mother   . Anesthesia problems Neg Hx   . Hypotension Neg Hx   . Malignant hyperthermia Neg Hx   . Pseudochol deficiency Neg Hx   . Hypertension Father    Social History:  reports that he quit smoking about 54 years ago. He has never used smokeless tobacco. He reports that he does not drink alcohol or use illicit drugs.  Allergies: No Known Allergies  Current outpatient prescriptions:acetaminophen (TYLENOL) 500 MG tablet, Take 500 mg by mouth every 6 (six) hours as needed for mild pain or moderate pain., Disp: , Rfl: ;   aspirin 81 MG tablet, Take 81 mg by mouth daily. , Disp: , Rfl: ;  calcium-vitamin D (OSCAL WITH D) 500-200 MG-UNIT per tablet, Take 1 tablet by mouth daily with breakfast., Disp: , Rfl:  insulin glargine (LANTUS) 100 UNIT/ML injection, Inject 0.15 mLs (15 Units total) into the skin at bedtime., Disp: 10 mL, Rfl: 3;  metFORMIN (GLUCOPHAGE) 500 MG tablet, Take 500 mg by mouth 2 (two) times daily with a meal., Disp: , Rfl: ;  metoprolol succinate (TOPROL-XL) 100 MG 24 hr tablet, Take 100 mg by mouth every morning. , Disp: , Rfl: ;  Multiple Vitamin (MULTIVITAMIN) tablet, Take 1 tablet by mouth daily., Disp: , Rfl:  mupirocin ointment (BACTROBAN) 2 %, Apply 1 application topically 2 (two) times daily. , Disp: , Rfl: ;  silver sulfADIAZINE (SILVADENE) 1 % cream, Apply 1 application topically daily., Disp: , Rfl:  Current facility-administered medications:ceFAZolin (ANCEF) IVPB 2 g/50 mL premix, 2 g, Intravenous, Once, Robet Leu, PA-C;  fentaNYL (SUBLIMAZE) 0.05 MG/ML injection, , , , ;  heparin lock flush 100 UNIT/ML injection, , , , ;  lidocaine (XYLOCAINE) 1 % (with pres) injection, , , , ;  midazolam (VERSED) 2 MG/2ML injection, , , ,    Results for orders placed during the hospital encounter of 04/10/13 (from  the past 48 hour(s))  APTT     Status: None   Collection Time    04/10/13 12:10 PM      Result Value Range   aPTT 26  24 - 37 seconds  BASIC METABOLIC PANEL     Status: Abnormal   Collection Time    04/10/13 12:10 PM      Result Value Range   Sodium 137  135 - 145 mEq/L   Potassium 3.3 (*) 3.5 - 5.1 mEq/L   Chloride 94 (*) 96 - 112 mEq/L   CO2 29  19 - 32 mEq/L   Glucose, Bld 178 (*) 70 - 99 mg/dL   BUN 18  6 - 23 mg/dL   Creatinine, Ser 1.61  0.50 - 1.35 mg/dL   Calcium 09.6 (*) 8.4 - 10.5 mg/dL   GFR calc non Af Amer 62 (*) >90 mL/min   GFR calc Af Amer 71 (*) >90 mL/min   Comment: (NOTE)     The eGFR has been calculated using the CKD EPI equation.     This calculation has  not been validated in all clinical situations.     eGFR's persistently <90 mL/min signify possible Chronic Kidney     Disease.  CBC     Status: Abnormal   Collection Time    04/10/13 12:10 PM      Result Value Range   WBC 12.4 (*) 4.0 - 10.5 K/uL   RBC 4.81  4.22 - 5.81 MIL/uL   Hemoglobin 15.1  13.0 - 17.0 g/dL   HCT 04.5  40.9 - 81.1 %   MCV 92.5  78.0 - 100.0 fL   MCH 31.4  26.0 - 34.0 pg   MCHC 33.9  30.0 - 36.0 g/dL   RDW 91.4  78.2 - 95.6 %   Platelets 147 (*) 150 - 400 K/uL  PROTIME-INR     Status: None   Collection Time    04/10/13 12:10 PM      Result Value Range   Prothrombin Time 14.7  11.6 - 15.2 seconds   INR 1.17  0.00 - 1.49   No results found.  Review of Systems  Constitutional: Negative for fever and chills.  Respiratory: Negative for cough and shortness of breath.   Cardiovascular: Negative for chest pain.  Gastrointestinal: Negative for nausea, vomiting and abdominal pain.  Musculoskeletal: Negative for back pain.  Neurological: Negative for headaches.    Blood pressure 115/69, pulse 61, temperature 97.7 F (36.5 C), temperature source Oral, resp. rate 18, SpO2 96.00%. Physical Exam  Constitutional: He is oriented to person, place, and time. He appears well-developed and well-nourished.  Cardiovascular: Normal rate and regular rhythm.   Murmur heard. Respiratory: Effort normal and breath sounds normal.  GI: Soft. Bowel sounds are normal. There is no tenderness.  Musculoskeletal: Normal range of motion. He exhibits no edema.  Bandage left lower leg from recent ulcerated lesion removed by primary MD 12/18- path pending  Neurological: He is alert and oriented to person, place, and time.     Assessment/Plan Pt with hx of metastatic pancreatic carcinoma and stage IIC melanoma. Plan is for port a cath placement today for chemotherapy. Details/risks of procedure d/w pt with his understanding and consent.  ALLRED,D KEVIN 04/10/2013, 1:00 PM

## 2013-04-13 ENCOUNTER — Other Ambulatory Visit: Payer: Medicare Other

## 2013-04-13 ENCOUNTER — Encounter: Payer: Self-pay | Admitting: *Deleted

## 2013-04-13 ENCOUNTER — Other Ambulatory Visit: Payer: Self-pay | Admitting: *Deleted

## 2013-04-13 DIAGNOSIS — C439 Malignant melanoma of skin, unspecified: Secondary | ICD-10-CM

## 2013-04-13 MED ORDER — LIDOCAINE-PRILOCAINE 2.5-2.5 % EX CREA: 1.0000 "application " | TOPICAL_CREAM | CUTANEOUS | Status: DC | PRN

## 2013-04-13 MED ORDER — PROCHLORPERAZINE MALEATE 10 MG PO TABS: 10.0000 mg | ORAL_TABLET | Freq: Four times a day (QID) | ORAL | Status: DC | PRN

## 2013-04-21 ENCOUNTER — Ambulatory Visit (HOSPITAL_BASED_OUTPATIENT_CLINIC_OR_DEPARTMENT_OTHER): Payer: Medicare Other | Admitting: Physician Assistant

## 2013-04-21 ENCOUNTER — Telehealth: Payer: Self-pay | Admitting: Internal Medicine

## 2013-04-21 ENCOUNTER — Ambulatory Visit (HOSPITAL_BASED_OUTPATIENT_CLINIC_OR_DEPARTMENT_OTHER): Payer: Medicare Other

## 2013-04-21 ENCOUNTER — Encounter: Payer: Self-pay | Admitting: Physician Assistant

## 2013-04-21 ENCOUNTER — Other Ambulatory Visit (HOSPITAL_BASED_OUTPATIENT_CLINIC_OR_DEPARTMENT_OTHER): Payer: Medicare Other

## 2013-04-21 VITALS — BP 112/81 | HR 98 | Temp 97.1°F | Resp 18 | Ht 68.0 in | Wt 171.8 lb

## 2013-04-21 DIAGNOSIS — C252 Malignant neoplasm of tail of pancreas: Secondary | ICD-10-CM

## 2013-04-21 DIAGNOSIS — C7800 Secondary malignant neoplasm of unspecified lung: Secondary | ICD-10-CM

## 2013-04-21 DIAGNOSIS — C787 Secondary malignant neoplasm of liver and intrahepatic bile duct: Secondary | ICD-10-CM

## 2013-04-21 DIAGNOSIS — C4360 Malignant melanoma of unspecified upper limb, including shoulder: Secondary | ICD-10-CM

## 2013-04-21 DIAGNOSIS — C259 Malignant neoplasm of pancreas, unspecified: Secondary | ICD-10-CM

## 2013-04-21 DIAGNOSIS — C7970 Secondary malignant neoplasm of unspecified adrenal gland: Secondary | ICD-10-CM

## 2013-04-21 DIAGNOSIS — Z5111 Encounter for antineoplastic chemotherapy: Secondary | ICD-10-CM

## 2013-04-21 LAB — CBC WITH DIFFERENTIAL/PLATELET
Basophils Absolute: 0 10*3/uL (ref 0.0–0.1)
EOS%: 1.8 % (ref 0.0–7.0)
Eosinophils Absolute: 0.2 10*3/uL (ref 0.0–0.5)
HGB: 15.6 g/dL (ref 13.0–17.1)
MONO#: 0.8 10*3/uL (ref 0.1–0.9)
NEUT#: 8.7 10*3/uL — ABNORMAL HIGH (ref 1.5–6.5)
NEUT%: 68.5 % (ref 39.0–75.0)
RDW: 13.7 % (ref 11.0–14.6)
WBC: 12.7 10*3/uL — ABNORMAL HIGH (ref 4.0–10.3)
lymph#: 3 10*3/uL (ref 0.9–3.3)
nRBC: 0 % (ref 0–0)

## 2013-04-21 LAB — COMPREHENSIVE METABOLIC PANEL (CC13)
ALT: 29 U/L (ref 0–55)
Albumin: 3.5 g/dL (ref 3.5–5.0)
Alkaline Phosphatase: 451 U/L — ABNORMAL HIGH (ref 40–150)
CO2: 27 mEq/L (ref 22–29)
Chloride: 98 mEq/L (ref 98–109)
Glucose: 160 mg/dl — ABNORMAL HIGH (ref 70–140)
Potassium: 3.5 mEq/L (ref 3.5–5.1)
Sodium: 141 mEq/L (ref 136–145)
Total Protein: 7 g/dL (ref 6.4–8.3)

## 2013-04-21 MED ORDER — DEXAMETHASONE SODIUM PHOSPHATE 10 MG/ML IJ SOLN
INTRAMUSCULAR | Status: AC
  Filled 2013-04-21: qty 1

## 2013-04-21 MED ORDER — PACLITAXEL PROTEIN-BOUND CHEMO INJECTION 100 MG
125.0000 mg/m2 | Freq: Once | INTRAVENOUS | Status: AC
  Administered 2013-04-21: 250 mg via INTRAVENOUS
  Filled 2013-04-21: qty 50

## 2013-04-21 MED ORDER — GEMCITABINE HCL CHEMO INJECTION 1 GM/26.3ML
1000.0000 mg/m2 | Freq: Once | INTRAVENOUS | Status: AC
  Administered 2013-04-21: 1976 mg via INTRAVENOUS
  Filled 2013-04-21: qty 51.97

## 2013-04-21 MED ORDER — SODIUM CHLORIDE 0.9 % IJ SOLN
10.0000 mL | INTRAMUSCULAR | Status: DC | PRN
  Administered 2013-04-21: 10 mL
  Filled 2013-04-21: qty 10

## 2013-04-21 MED ORDER — DEXAMETHASONE SODIUM PHOSPHATE 10 MG/ML IJ SOLN
10.0000 mg | Freq: Once | INTRAMUSCULAR | Status: AC
  Administered 2013-04-21: 10 mg via INTRAVENOUS

## 2013-04-21 MED ORDER — ONDANSETRON 8 MG/50ML IVPB (CHCC)
8.0000 mg | Freq: Once | INTRAVENOUS | Status: AC
  Administered 2013-04-21: 8 mg via INTRAVENOUS

## 2013-04-21 MED ORDER — SODIUM CHLORIDE 0.9 % IV SOLN
Freq: Once | INTRAVENOUS | Status: AC
  Administered 2013-04-21: 12:00:00 via INTRAVENOUS

## 2013-04-21 MED ORDER — HEPARIN SOD (PORK) LOCK FLUSH 100 UNIT/ML IV SOLN
500.0000 [IU] | Freq: Once | INTRAVENOUS | Status: AC | PRN
  Administered 2013-04-21: 500 [IU]
  Filled 2013-04-21: qty 5

## 2013-04-21 MED ORDER — ONDANSETRON 8 MG/NS 50 ML IVPB
INTRAVENOUS | Status: AC
  Filled 2013-04-21: qty 8

## 2013-04-21 NOTE — Progress Notes (Addendum)
[facility] [phone]   [phone]  SHARED VISIT PROGRESS NOTE  Herb Grays, MD [address] [place] [address]  DIAGNOSIS:  1) Metastatic pancreatic adenocarcinoma presented with large mass in the care of the pancreas was metastatic liver lesions diagnosed in December of 2014. 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 Magnus Ivan.   CURRENT THERAPY: Systemic chemotherapy with gemcitabine 1000 mg/M2 and Abraxane 125 mg/M2 on days, 1, 8, and 15 every 4 weeks. First dose given on 04/21/2013.  INTERVAL HISTORY: DARROLL BREDESON 79 y.o. male returns to the clinic today for followup visit accompanied by his son. He presents to proceed with his first cycle of systemic chemotherapy with gemcitabine and Abraxane. His son states that Mr. Thomason has issues with his short-term memory and is forgetting to take his medications as a result. Currently is not using a pill box or any other sort of reminder. He was diagnosed with diabetes during his recent hospitalization.  He denied having any significant chest pain, shortness of breath, cough or hemoptysis. The patient denied having any nausea or vomiting, no abdominal pain, no diarrhea or constipation. He denied having any back pain. The patient denied having any weight loss or night sweats. He denied having any recent jaundice.    MEDICAL HISTORY: Past Medical History  Diagnosis Date  . Hyperlipidemia     takes Lipitor daily  . Hypertension     takes Amlodipine,Metoprolol,and Losartan daily  . Hx of colonic polyps   . Type 2 diabetes mellitus with vascular disease   . Aortic stenosis     Last ECHO Dr. Donnie Aho 09/24/12  AVA 0.76 assymptomatic   .  CAD (coronary artery disease) 09/24/2012    Cath 07/04/2002 normal Left main, 50% stenosis proximal LAD, 99% stenosis mid LAD, 50% stenosis mid CFX, 60% stenosis proximal PDA, stent placed mid LAD  Taxus stent to mid LAD 07/04/2002   . Hypertensive heart disease     ALLERGIES:  has No Known Allergies.  MEDICATIONS:  Current Outpatient Prescriptions  Medication Sig Dispense Refill  . acetaminophen (TYLENOL) 500 MG tablet Take 500 mg by mouth every 6 (six) hours as needed for mild pain or moderate pain.      Marland Kitchen FREESTYLE LITE test strip       . Lancets (FREESTYLE) lancets       . lidocaine-prilocaine (EMLA) cream Apply 1 application topically as needed. apply to port 1 hour before chemo appt.  30 g  0  . metFORMIN (GLUCOPHAGE) 500 MG tablet Take 500 mg by mouth 2 (two) times daily with a meal.      . metoprolol succinate (TOPROL-XL) 100 MG 24 hr tablet Take 100 mg by mouth every morning.       . Multiple Vitamin (MULTIVITAMIN) tablet Take 1 tablet by mouth daily.      Marland Kitchen aspirin 81 MG tablet Take 81 mg by mouth daily.       . calcium-vitamin D (OSCAL WITH D) 500-200 MG-UNIT per tablet Take 1 tablet by mouth daily with breakfast.      . insulin glargine (LANTUS) 100 UNIT/ML injection Inject 0.15 mLs (15 Units  total) into the skin at bedtime.  10 mL  3  . mupirocin ointment (BACTROBAN) 2 % Apply 1 application topically 2 (two) times daily.       . prochlorperazine (COMPAZINE) 10 MG tablet Take 1 tablet (10 mg total) by mouth every 6 (six) hours as needed for nausea or vomiting.  30 tablet  1  . silver sulfADIAZINE (SILVADENE) 1 % cream Apply 1 application topically daily.       No current facility-administered medications for this visit.   Facility-Administered Medications Ordered in Other Visits  Medication Dose Route Frequency Provider Last Rate Last Dose  . sodium chloride 0.9 % injection 10 mL  10 mL Intracatheter PRN Si Gaul, MD   10 mL at 04/21/13 1501    SURGICAL HISTORY:  Past  Surgical History  Procedure Laterality Date  . Malignanat melanoma  2003  . Hernia repair  1957  . Appendectomy    . Cardiac catheterization  10+yrs ago  . Coronary stent placement  2004    Taxus stent to mid LAD  . Colonoscopy    . Melanoma excision  06/07/2011    Procedure: MELANOMA EXCISION;  Surgeon: Shelly Rubenstein, MD;  Location: [facility] OR;  Service: General;  Laterality: Right;  wide excision melanoma right shoulder  and left back and left upper arm   . Mastoid debridement      REVIEW OF SYSTEMS:  Constitutional: positive for fatigue Eyes: negative Ears, nose, mouth, throat, and face: negative Respiratory: negative Cardiovascular: negative Gastrointestinal: negative Genitourinary:negative Integument/breast: negative Hematologic/lymphatic: negative Musculoskeletal:negative Neurological: positive for memory problems Behavioral/Psych: negative Endocrine: negative Allergic/Immunologic: negative   PHYSICAL EXAMINATION: General appearance: alert, cooperative, fatigued and no distress Head: Normocephalic, without obvious abnormality, atraumatic Neck: no adenopathy, no JVD, supple, symmetrical, trachea midline and thyroid not enlarged, symmetric, no tenderness/mass/nodules Lymph nodes: Cervical, supraclavicular, and axillary nodes normal. Resp: clear to auscultation bilaterally Back: symmetric, no curvature. ROM normal. No CVA tenderness. Cardio: regular rate and rhythm, S1, S2 normal, no murmur, click, rub or gallop GI: soft, non-tender; bowel sounds normal; no masses,  no organomegaly Extremities: extremities normal, atraumatic, no cyanosis or edema Neurologic: Alert and oriented X 3, normal strength and tone. Normal symmetric reflexes. Normal coordination and gait Port-A-Cath in place right anterior chest, no evidence of induration, or infection and there is no tenderness  ECOG PERFORMANCE STATUS: 1 - Symptomatic but completely ambulatory  Blood pressure 112/81, pulse 98,  temperature 97.1 F (36.2 C), temperature source Oral, resp. rate 18, height 5\' 8"  (1.727 m), weight 171 lb 12.8 oz (77.928 kg).  LABORATORY DATA: Lab Results  Component Value Date   WBC 12.7* 04/21/2013   HGB 15.6 04/21/2013   HCT 45.9 04/21/2013   MCV 92.4 04/21/2013   PLT 112* 04/21/2013      Chemistry      Component Value Date/Time   NA 141 04/21/2013 0822   NA 137 04/10/2013 1210   K 3.5 04/21/2013 0822   K 3.3* 04/10/2013 1210   CL 94* 04/10/2013 1210   CL 94* 06/25/2012 1008   CO2 27 04/21/2013 0822   CO2 29 04/10/2013 1210   BUN 23.0 04/21/2013 0822   BUN 18 04/10/2013 1210   CREATININE 1.1 04/21/2013 0822   CREATININE 1.09 04/10/2013 1210      Component Value Date/Time   CALCIUM 10.0 04/21/2013 0822   CALCIUM 10.9* 04/10/2013 1210   ALKPHOS 451* 04/21/2013 0822   ALKPHOS 373* 03/29/2013 0138   AST 35* 04/21/2013 1610  AST 48* 03/29/2013 0138   ALT 29 04/21/2013 0822   ALT 63* 03/29/2013 0138   BILITOT 1.32* 04/21/2013 0822   BILITOT 0.8 03/29/2013 0138       RADIOGRAPHIC STUDIES: Dg Chest 2 View  03/28/2013   CLINICAL DATA:  Altered mental status.  Ex-smoker.  EXAM: CHEST  2 VIEW  COMPARISON:  06/05/2011 and chest CT dated 06/19/2011.  FINDINGS: The heart remains normal in size and the aorta remains tortuous. No aneurysm on the previous CT. Interval small linear density at the left lung base. The right lung remains clear with a stable overlying nipple shadow. Thoracic spine degenerative changes, including changes of DISH. Mild scoliosis.  IMPRESSION: Interval minimal linear atelectasis or scarring at the left lung base. Otherwise, unremarkable examination.   Electronically Signed   By: Gordan Payment M.D.   On: 03/28/2013 22:17   Ct Head Wo Contrast  03/29/2013   CLINICAL DATA:  Altered mental status; hyperglycemia.  EXAM: CT HEAD WITHOUT CONTRAST  TECHNIQUE: Contiguous axial images were obtained from the base of the skull through the vertex without intravenous  contrast.  COMPARISON:  CT of the head performed 06/19/2011  FINDINGS: There is no evidence of acute infarction, mass lesion, or intra- or extra-axial hemorrhage on CT.  Prominence of the sulci suggests mild cortical volume loss. Mild periventricular white matter change likely reflects small vessel ischemic microangiopathy. Mild cerebellar atrophy is noted.  The posterior fossa, including the cerebellum, brainstem and fourth ventricle, is within normal limits. The third and lateral ventricles, and basal ganglia are unremarkable in appearance. The cerebral hemispheres are symmetric in appearance, with normal gray-white differentiation. No mass effect or midline shift is seen.  There is no evidence of fracture; visualized osseous structures are unremarkable in appearance. The orbits are within normal limits. The paranasal sinuses and mastoid air cells are well-aerated. No significant soft tissue abnormalities are seen.  IMPRESSION: 1. No acute intracranial pathology seen on CT. 2. Mild cortical volume loss and scattered small vessel ischemic microangiopathy.   Electronically Signed   By: Roanna Raider M.D.   On: 03/29/2013 02:47   Ct Chest W Contrast  03/30/2013   CLINICAL DATA:  Questionable metastatic disease to the lungs. History of malignant melanoma.  EXAM: CT CHEST WITH CONTRAST  TECHNIQUE: Multidetector CT imaging of the chest was performed during intravenous contrast administration.  CONTRAST:  80mL OMNIPAQUE IOHEXOL 300 MG/ML  SOLN  COMPARISON:  06/19/2011  FINDINGS: Heart is upper limits normal in size. Dense coronary artery calcifications throughout all 3 major coronary vessels. Aorta is ectatic and tortuous, non aneurysmal. Moderate atherosclerotic calcifications/disease throughout the aorta.  No mediastinal, hilar, or axillary adenopathy. Chest wall soft tissues are unremarkable.  6 mm nodule in the inferior peripheral right middle lobe on image 33 of series 3. This is new since prior study. No  suspicious pulmonary nodules on the left. Linear areas of scarring in the left lower lobe. No pleural effusions.  Imaging into the upper abdomen demonstrates numerous low-density lesions throughout the liver, increased significantly since prior study compatible with metastatic disease. Index right hepatic lesion on image 45 has a maximum diameter of 2.7 cm. Most of these are new since prior study. There is a hypodensity noted posteriorly in the right hepatic lobe which was seen previously measuring 2.5 cm. This currently measures 2.7 cm.  There is a mass adjacent to the pancreatic tail and extending to the splenic hilum. This measures 5.0 x 4.7 cm on image 50.  This is concerning for a pancreatic tail lesion, presumably metastasis.  No acute or focal bone lesion. Degenerative changes throughout the thoracic spine.  IMPRESSION: 6 mm peripheral right middle lobe nodule, nonspecific, but given the findings in the abdomen is concerning for a metastatic focus.  Numerous low-density ectatic lesions, significantly increased since prior study concerning for metastases.  Soft tissue mass in the region of the pancreatic tail and extending to the splenic hilum concerning for pancreatic tail lesion, presumably metastasis.  Coronary artery disease.   Electronically Signed   By: Charlett Nose M.D.   On: 03/30/2013 16:40   Ct Abdomen Pelvis W Contrast  03/29/2013   CLINICAL DATA:  Leukocytosis, elevated alk phos, hyperglycemia  EXAM: CT ABDOMEN AND PELVIS WITH CONTRAST  TECHNIQUE: Multidetector CT imaging of the abdomen and pelvis was performed using the standard protocol following bolus administration of intravenous contrast.  CONTRAST:  OMNIPAQUE IOHEXOL 300 MG/ML  SOLN  COMPARISON:  06/19/2011  FINDINGS: Mild scarring in the bilateral lower lobes.  Cardiomegaly. Coronary atherosclerosis. Atherosclerotic calcifications of the aortic valve.  2.2 x 2.6 cm probable hemangioma in the posterior segment right hepatic lobe  (series 2/ image 26), unchanged. Numerous additional hypoenhancing hepatic lesions in both lobes, new, suspicious for metastases. Dominant lesions include:  --2.8 x 2.6 cm lesion in the posterior right hepatic dome (series 2/ image 20)  --1.5 x 1.4 cm lesion in the lateral segment left hepatic lobe (series 2/image 23)  --2.9 x 2.6 cm lesion in the anterior segment right hepatic lobe (series 2/image 26)  5.3 x 4.4 x 4.6 cm hypoenhancing mass in the pancreatic tail (series 2/ image 29), highly suspicious for primary pancreatic neoplasm.  Possible direct invasion versus metastasis in the splenic hilum (series 2/ image 27).  Mild nodularity of the bilateral adrenal glands with suspected 1.4 cm left adrenal metastasis (series 2/image 33).  Gallbladder is unremarkable. No intrahepatic or extrahepatic ductal dilatation.  Multiple bilateral renal cysts measuring up to 2.2 cm in the posterior right lower pole (series 7/image 20) and 1.2 cm in the left lateral interpolar region (series 7/ image 16). Bilateral renal cortical scarring. No hydronephrosis.  No evidence of bowel obstruction.  Atherosclerotic calcifications of the abdominal aorta and branch vessels. 3.7 x 3.3 cm infrarenal abdominal aortic aneurysm (series 2/ image 42). Additional fusiform ectasia measuring up to 3.2 x 3.0 cm just above the aortic bifurcation.  No abdominopelvic ascites.  10 mm short axis retroperitoneal noted just above the left renal vein (series 2/image 34).  Prostate is notable for margin of the central gland which indents the base of the bladder.  Bladder is unremarkable.  Degenerative changes of the visualized thoracolumbar spine.  IMPRESSION: 5.3 cm pancreatic tail mass, highly suspicious for primary pancreatic neoplasm.  Numerous hepatic metastases in both lobes, with index lesions as described above.  Direct invasion versus splenic metastasis.  Left adrenal metastasis.  Additional ancillary findings as above.   Electronically Signed   By:  Charline Bills M.D.   On: 03/29/2013 13:45   US Biopsy  03/30/2013   CLINICAL DATA:  Multiple hepatic mass lesions and 5 cm mass of the tail of the pancreas. History of melanoma. The patient presents for biopsy of one of the hepatic masses.  EXAM: ULTRASOUND GUIDED CORE BIOPSY OF LIVER  MEDICATIONS: 2.0 mg IV Versed; 50 mcg IV Fentanyl  Total Moderate Sedation Time: 13 min.  PROCEDURE: The procedure, risks, benefits, and alternatives were explained to the patient. Questions  regarding the procedure were encouraged and answered. The patient understands and consents to the procedure.  The right abdominal wall was prepped with Betadine in a sterile fashion, and a sterile drape was applied covering the operative field. A sterile gown and sterile gloves were used for the procedure. Local anesthesia was provided with 1% Lidocaine.  Ultrasound was used to localize lesions in the liver. A lesion in the right lobe was chosen for sampling. Under direct ultrasound guidance, a 17 gauge needle was advanced to the lateral margin of the lesion. Core biopsy was performed with an 18 gauge device. A total of 3 samples were obtained and submitted in formalin. The outer needle was then removed and additional ultrasound performed.  COMPLICATIONS: None.  FINDINGS: The largest mass lesion in the right lobe of the liver roughly at the same level as the gallbladder was well visualized by ultrasound. Solid tissue was obtained from the lesion. There were no immediate bleeding complications.  IMPRESSION: Ultrasound-guided core biopsy performed of 80 lesion in the right lobe of the liver.   Electronically Signed   By: Irish Lack M.D.   On: 03/30/2013 11:05    ASSESSMENT AND PLAN: This is a very pleasant 79 years old white male with history of a stage II malignant melanoma of the left arm status post excision and recently diagnosed with metastatic pancreatic adenocarcinoma with a large mass in the tail of the pancreas and several  metastatic liver lesions as well as adrenal and pulmonary metastasis. Patient was discussed with also seen by Dr. Arbutus Ped. He will proceed with his first cycle of systemic chemotherapy with gemcitabine and Abraxane as scheduled today. The patient was given a pill box with morning and evening separate compartments of the mid to remember to take his medications. His son states that he and his sister will dizzy her best to ensure that Mr. Stavola takes his medications as prescribed. He will continue with chemotherapy and labs as scheduled and return in 3 weeks for another symptom management visit prior to cycle #2.  The patient was advised to call immediately if he has any concerning symptoms in the interval. The patient voices understanding of current disease status and treatment options and is in agreement with the current care plan.  All questions were answered. The patient knows to call the clinic with any problems, questions or concerns. We can certainly see the patient much sooner if necessary.  Conni Slipper PA-C  ADDENDUM: Hematology/Oncology Attending: I had the face-to-face encounter with the patient. I recommended his care plan. This is a very pleasant 79 years old white male who was recently diagnosed with metastatic adenocarcinoma of the pancreas and he is here today to start the first cycle of chemotherapy with gemcitabine and Abraxane. The patient is feeling fine today with no specific complaints except for occasional dizzy spells.  We will proceed with the first cycle today as scheduled. The patient would come back for follow up visit in 3 weeks for evaluation and management any adverse effect of his treatment. He was advised to call immediately if he has any concerning symptoms in the interval. Lajuana Matte., MD 04/23/2013

## 2013-04-21 NOTE — Patient Instructions (Addendum)
[facility] Discharge Instructions for Patients Receiving Chemotherapy  Today you received the following chemotherapy agents: Abraxane, Gemzar  To help prevent nausea and vomiting after your treatment, we encourage you to take your nausea medication as prescribed.    If you develop nausea and vomiting that is not controlled by your nausea medication, call the clinic.   BELOW ARE SYMPTOMS THAT SHOULD BE REPORTED IMMEDIATELY:  *FEVER GREATER THAN 100.5 F  *CHILLS WITH OR WITHOUT FEVER  NAUSEA AND VOMITING THAT IS NOT CONTROLLED WITH YOUR NAUSEA MEDICATION  *UNUSUAL SHORTNESS OF BREATH  *UNUSUAL BRUISING OR BLEEDING  TENDERNESS IN MOUTH AND THROAT WITH OR WITHOUT PRESENCE OF ULCERS  *URINARY PROBLEMS  *BOWEL PROBLEMS  UNUSUAL RASH Items with * indicate a potential emergency and should be followed up as soon as possible.  Feel free to call the clinic you have any questions or concerns. The clinic phone number is [phone].  Nanoparticle Albumin-Bound Paclitaxel injection - Abraxane What is this medicine? NANOPARTICLE ALBUMIN-BOUND PACLITAXEL (Na no PAHR ti kuhl al BYOO muhn-bound PAK li TAX el) is a chemotherapy drug. It targets fast dividing cells, like cancer cells, and causes these cells to die. This medicine is used to treat advanced breast cancer and advanced lung cancer. This medicine may be used for other purposes; ask your health care provider or pharmacist if you have questions. COMMON BRAND NAME(S): Abraxane What should I tell my health care provider before I take this medicine? They need to know if you have any of these conditions: -kidney disease -liver disease -low blood counts, like low platelets, red blood cells, or white blood cells -recent or ongoing radiation therapy -an unusual or allergic reaction to paclitaxel, albumin, other chemotherapy, other medicines, foods, dyes, or preservatives -pregnant or trying to get  pregnant -breast-feeding How should I use this medicine? This drug is given as an infusion into a vein. It is administered in a hospital or clinic by a specially trained health care professional. Talk to your pediatrician regarding the use of this medicine in children. Special care may be needed. Overdosage: If you think you have taken too much of this medicine contact a poison control center or emergency room at once. NOTE: This medicine is only for you. Do not share this medicine with others. What if I miss a dose? It is important not to miss your dose. Call your doctor or health care professional if you are unable to keep an appointment. What may interact with this medicine? -cyclosporine -diazepam -ketoconazole -medicines to increase blood counts like filgrastim, pegfilgrastim, sargramostim -other chemotherapy drugs like cisplatin, doxorubicin, epirubicin, etoposide, teniposide, vincristine -quinidine -testosterone -vaccines -verapamil Talk to your doctor or health care professional before taking any of these medicines: -acetaminophen -aspirin -ibuprofen -ketoprofen -naproxen This list may not describe all possible interactions. Give your health care provider a list of all the medicines, herbs, non-prescription drugs, or dietary supplements you use. Also tell them if you smoke, drink alcohol, or use illegal drugs. Some items may interact with your medicine. What should I watch for while using this medicine? Your condition will be monitored carefully while you are receiving this medicine. You will need important blood work done while you are taking this medicine. This drug may make you feel generally unwell. This is not uncommon, as chemotherapy can affect healthy cells as well as cancer cells. Report any side effects. Continue your course of treatment even though you feel ill unless your doctor tells you to stop. In   some cases, you may be given additional medicines to help with side  effects. Follow all directions for their use. Call your doctor or health care professional for advice if you get a fever, chills or sore throat, or other symptoms of a cold or flu. Do not treat yourself. This drug decreases your body's ability to fight infections. Try to avoid being around people who are sick. This medicine may increase your risk to bruise or bleed. Call your doctor or health care professional if you notice any unusual bleeding. Be careful brushing and flossing your teeth or using a toothpick because you may get an infection or bleed more easily. If you have any dental work done, tell your dentist you are receiving this medicine. Avoid taking products that contain aspirin, acetaminophen, ibuprofen, naproxen, or ketoprofen unless instructed by your doctor. These medicines may hide a fever. Do not become pregnant while taking this medicine. Women should inform their doctor if they wish to become pregnant or think they might be pregnant. There is a potential for serious side effects to an unborn child. Talk to your health care professional or pharmacist for more information. Do not breast-feed an infant while taking this medicine. Men are advised not to father a child while receiving this medicine. What side effects may I notice from receiving this medicine? Side effects that you should report to your doctor or health care professional as soon as possible: -allergic reactions like skin rash, itching or hives, swelling of the face, lips, or tongue -low blood counts - This drug may decrease the number of white blood cells, red blood cells and platelets. You may be at increased risk for infections and bleeding. -signs of infection - fever or chills, cough, sore throat, pain or difficulty passing urine -signs of decreased platelets or bleeding - bruising, pinpoint red spots on the skin, black, tarry stools, nosebleeds -signs of decreased red blood cells - unusually weak or tired, fainting  spells, lightheadedness -breathing problems -changes in vision -chest pain -high or low blood pressure -mouth sores -nausea and vomiting -pain, swelling, redness or irritation at the injection site -pain, tingling, numbness in the hands or feet -slow or irregular heartbeat -swelling of the ankle, feet, hands Side effects that usually do not require medical attention (report to your doctor or health care professional if they continue or are bothersome): -aches, pains -changes in the color of fingernails -diarrhea -hair loss -loss of appetite This list may not describe all possible side effects. Call your doctor for medical advice about side effects. You may report side effects to FDA at 1-800-FDA-1088. Where should I keep my medicine? This drug is given in a hospital or clinic and will not be stored at home. NOTE: This sheet is a summary. It may not cover all possible information. If you have questions about this medicine, talk to your doctor, pharmacist, or health care provider.  2014, Elsevier/Gold Standard. (2012-06-02 16:48:50)  Gemcitabine injection - Gemzar What is this medicine? GEMCITABINE (jem SIT a been) is a chemotherapy drug. This medicine is used to treat many types of cancer like breast cancer, lung cancer, pancreatic cancer, and ovarian cancer. This medicine may be used for other purposes; ask your health care provider or pharmacist if you have questions. COMMON BRAND NAME(S): Gemzar What should I tell my health care provider before I take this medicine? They need to know if you have any of these conditions: -blood disorders -infection -kidney disease -liver disease -recent or ongoing radiation therapy -an   unusual or allergic reaction to gemcitabine, other chemotherapy, other medicines, foods, dyes, or preservatives -pregnant or trying to get pregnant -breast-feeding How should I use this medicine? This drug is given as an infusion into a vein. It is administered  in a hospital or clinic by a specially trained health care professional. Talk to your pediatrician regarding the use of this medicine in children. Special care may be needed. Overdosage: If you think you have taken too much of this medicine contact a poison control center or emergency room at once. NOTE: This medicine is only for you. Do not share this medicine with others. What if I miss a dose? It is important not to miss your dose. Call your doctor or health care professional if you are unable to keep an appointment. What may interact with this medicine? -medicines to increase blood counts like filgrastim, pegfilgrastim, sargramostim -some other chemotherapy drugs like cisplatin -vaccines Talk to your doctor or health care professional before taking any of these medicines: -acetaminophen -aspirin -ibuprofen -ketoprofen -naproxen This list may not describe all possible interactions. Give your health care provider a list of all the medicines, herbs, non-prescription drugs, or dietary supplements you use. Also tell them if you smoke, drink alcohol, or use illegal drugs. Some items may interact with your medicine. What should I watch for while using this medicine? Visit your doctor for checks on your progress. This drug may make you feel generally unwell. This is not uncommon, as chemotherapy can affect healthy cells as well as cancer cells. Report any side effects. Continue your course of treatment even though you feel ill unless your doctor tells you to stop. In some cases, you may be given additional medicines to help with side effects. Follow all directions for their use. Call your doctor or health care professional for advice if you get a fever, chills or sore throat, or other symptoms of a cold or flu. Do not treat yourself. This drug decreases your body's ability to fight infections. Try to avoid being around people who are sick. This medicine may increase your risk to bruise or bleed.  Call your doctor or health care professional if you notice any unusual bleeding. Be careful brushing and flossing your teeth or using a toothpick because you may get an infection or bleed more easily. If you have any dental work done, tell your dentist you are receiving this medicine. Avoid taking products that contain aspirin, acetaminophen, ibuprofen, naproxen, or ketoprofen unless instructed by your doctor. These medicines may hide a fever. Women should inform their doctor if they wish to become pregnant or think they might be pregnant. There is a potential for serious side effects to an unborn child. Talk to your health care professional or pharmacist for more information. Do not breast-feed an infant while taking this medicine. What side effects may I notice from receiving this medicine? Side effects that you should report to your doctor or health care professional as soon as possible: -allergic reactions like skin rash, itching or hives, swelling of the face, lips, or tongue -low blood counts - this medicine may decrease the number of white blood cells, red blood cells and platelets. You may be at increased risk for infections and bleeding. -signs of infection - fever or chills, cough, sore throat, pain or difficulty passing urine -signs of decreased platelets or bleeding - bruising, pinpoint red spots on the skin, black, tarry stools, blood in the urine -signs of decreased red blood cells - unusually weak or tired,   fainting spells, lightheadedness -breathing problems -chest pain -mouth sores -nausea and vomiting -pain, swelling, redness at site where injected -pain, tingling, numbness in the hands or feet -stomach pain -swelling of ankles, feet, hands -unusual bleeding Side effects that usually do not require medical attention (report to your doctor or health care professional if they continue or are bothersome): -constipation -diarrhea -hair loss -loss of appetite -stomach  upset This list may not describe all possible side effects. Call your doctor for medical advice about side effects. You may report side effects to FDA at 1-800-FDA-1088. Where should I keep my medicine? This drug is given in a hospital or clinic and will not be stored at home. NOTE: This sheet is a summary. It may not cover all possible information. If you have questions about this medicine, talk to your doctor, pharmacist, or health care provider.  2014, Elsevier/Gold Standard. (2007-08-19 18:45:54)  

## 2013-04-21 NOTE — Telephone Encounter (Signed)
PT GIVEN APPT SCHEDULE FOR JANUARY THRU MARCH WHILE IN INF. APPTS FOR MARCH LB/FU LEFT AS IS ON SCHEDULE. NO INSTRCUTION RE THESE APPTS AT THIS TIME.

## 2013-04-22 ENCOUNTER — Telehealth: Payer: Self-pay | Admitting: *Deleted

## 2013-04-22 NOTE — Patient Instructions (Signed)
Continue labs and chemotherapy as scheduled Follow up in 3 weeks 

## 2013-04-22 NOTE — Telephone Encounter (Signed)
Left VM for patient to return call to triage today.

## 2013-04-28 ENCOUNTER — Other Ambulatory Visit (HOSPITAL_BASED_OUTPATIENT_CLINIC_OR_DEPARTMENT_OTHER): Payer: Medicare Other

## 2013-04-28 ENCOUNTER — Ambulatory Visit: Payer: Medicare Other

## 2013-04-28 DIAGNOSIS — C259 Malignant neoplasm of pancreas, unspecified: Secondary | ICD-10-CM

## 2013-04-28 DIAGNOSIS — C252 Malignant neoplasm of tail of pancreas: Secondary | ICD-10-CM

## 2013-04-28 LAB — CBC WITH DIFFERENTIAL/PLATELET
BASO%: 0.2 % (ref 0.0–2.0)
Basophils Absolute: 0 10*3/uL (ref 0.0–0.1)
EOS%: 1.6 % (ref 0.0–7.0)
Eosinophils Absolute: 0.1 10*3/uL (ref 0.0–0.5)
HCT: 40.8 % (ref 38.4–49.9)
HGB: 13.7 g/dL (ref 13.0–17.1)
LYMPH#: 1.5 10*3/uL (ref 0.9–3.3)
LYMPH%: 26.1 % (ref 14.0–49.0)
MCH: 31 pg (ref 27.2–33.4)
MCHC: 33.6 g/dL (ref 32.0–36.0)
MCV: 92.3 fL (ref 79.3–98.0)
MONO#: 0.3 10*3/uL (ref 0.1–0.9)
MONO%: 6 % (ref 0.0–14.0)
NEUT%: 66.1 % (ref 39.0–75.0)
NEUTROS ABS: 3.7 10*3/uL (ref 1.5–6.5)
Platelets: 71 10*3/uL — ABNORMAL LOW (ref 140–400)
RBC: 4.42 10*6/uL (ref 4.20–5.82)
RDW: 13.5 % (ref 11.0–14.6)
WBC: 5.7 10*3/uL (ref 4.0–10.3)

## 2013-04-28 NOTE — Progress Notes (Signed)
Pt. Platelets 71.  Reviewed with Dr. Julien Nordmann.  Per MD chemo treatment hold today, resume in 2 wks.  Spoke w/ patient in lobby, notified about lab results and MD instructions to hold treatment today and will see back in 2 weeks. Instructed pt. on bleeding precautions. Advised to call clinic with issues.  Pt verbalized understanding.

## 2013-05-05 ENCOUNTER — Telehealth: Payer: Self-pay | Admitting: *Deleted

## 2013-05-05 ENCOUNTER — Other Ambulatory Visit: Payer: Medicare Other

## 2013-05-05 ENCOUNTER — Ambulatory Visit: Payer: Medicare Other

## 2013-05-05 NOTE — Telephone Encounter (Signed)
Patient's sone called regarding his appts. The infusion note from 1/6 stated that appt to resume in two weeks. Appts in computer for 1/237. Left desk RN message to see which date is correct. Son aware

## 2013-05-06 ENCOUNTER — Ambulatory Visit: Payer: Medicare Other

## 2013-05-06 ENCOUNTER — Telehealth: Payer: Self-pay | Admitting: *Deleted

## 2013-05-06 ENCOUNTER — Other Ambulatory Visit: Payer: Medicare Other

## 2013-05-06 NOTE — Telephone Encounter (Signed)
Pt was sched for chemo today (c1/ d15 gemzar/ abraxane).  Pt's son called and said that pt does not want to come in today.  Pt's son is not sure if pt really wants to proceed with chemo.  Pt's son thinks he has alzheimers.  Will cancel chemo for today and pt is scheduled to see you 1/27 for his next cycle.  Dr Vista Mink informed.

## 2013-05-08 ENCOUNTER — Encounter: Payer: Self-pay | Admitting: *Deleted

## 2013-05-08 NOTE — Progress Notes (Signed)
[facility] Psychosocial Distress Screening Clinical Social Work  Clinical Social Work was referred by distress screening protocol.  The patient scored a 5 on the Psychosocial Distress Thermometer which indicates moderate distress. Clinical Social Worker contacted patient by phone to assess for distress and other psychosocial needs. The patient indicates no needs at this time.  At time of contact, patient shared he had a Evan visiting his home.  CSW encouraged patient to call with any questions or concerns.   Clinical Social Worker follow up needed: no  If yes, follow up plan:   Polo Riley, MSW, LCSW, OSW-C Clinical Social Worker [facility] [phone]

## 2013-05-12 ENCOUNTER — Other Ambulatory Visit: Payer: Medicare Other

## 2013-05-19 ENCOUNTER — Telehealth: Payer: Self-pay | Admitting: Internal Medicine

## 2013-05-19 ENCOUNTER — Other Ambulatory Visit (HOSPITAL_BASED_OUTPATIENT_CLINIC_OR_DEPARTMENT_OTHER): Payer: Medicare Other

## 2013-05-19 ENCOUNTER — Ambulatory Visit (HOSPITAL_BASED_OUTPATIENT_CLINIC_OR_DEPARTMENT_OTHER): Payer: Medicare Other

## 2013-05-19 ENCOUNTER — Encounter: Payer: Self-pay | Admitting: Internal Medicine

## 2013-05-19 ENCOUNTER — Ambulatory Visit (HOSPITAL_BASED_OUTPATIENT_CLINIC_OR_DEPARTMENT_OTHER): Payer: Medicare Other | Admitting: Internal Medicine

## 2013-05-19 VITALS — BP 120/72 | HR 74 | Temp 97.3°F | Resp 18 | Ht 68.0 in | Wt 175.0 lb

## 2013-05-19 DIAGNOSIS — C252 Malignant neoplasm of tail of pancreas: Secondary | ICD-10-CM

## 2013-05-19 DIAGNOSIS — C4360 Malignant melanoma of unspecified upper limb, including shoulder: Secondary | ICD-10-CM

## 2013-05-19 DIAGNOSIS — Z5111 Encounter for antineoplastic chemotherapy: Secondary | ICD-10-CM

## 2013-05-19 DIAGNOSIS — C4359 Malignant melanoma of other part of trunk: Secondary | ICD-10-CM

## 2013-05-19 DIAGNOSIS — C7970 Secondary malignant neoplasm of unspecified adrenal gland: Secondary | ICD-10-CM

## 2013-05-19 DIAGNOSIS — C787 Secondary malignant neoplasm of liver and intrahepatic bile duct: Secondary | ICD-10-CM

## 2013-05-19 DIAGNOSIS — C259 Malignant neoplasm of pancreas, unspecified: Secondary | ICD-10-CM

## 2013-05-19 DIAGNOSIS — C7800 Secondary malignant neoplasm of unspecified lung: Secondary | ICD-10-CM

## 2013-05-19 LAB — CBC WITH DIFFERENTIAL/PLATELET
BASO%: 0.7 % (ref 0.0–2.0)
Basophils Absolute: 0.1 10*3/uL (ref 0.0–0.1)
EOS%: 1.8 % (ref 0.0–7.0)
Eosinophils Absolute: 0.2 10*3/uL (ref 0.0–0.5)
HEMATOCRIT: 40.1 % (ref 38.4–49.9)
HGB: 13.3 g/dL (ref 13.0–17.1)
LYMPH#: 1.4 10*3/uL (ref 0.9–3.3)
LYMPH%: 12.8 % — ABNORMAL LOW (ref 14.0–49.0)
MCH: 32 pg (ref 27.2–33.4)
MCHC: 33.1 g/dL (ref 32.0–36.0)
MCV: 96.5 fL (ref 79.3–98.0)
MONO#: 0.7 10*3/uL (ref 0.1–0.9)
MONO%: 6.7 % (ref 0.0–14.0)
NEUT#: 8.4 10*3/uL — ABNORMAL HIGH (ref 1.5–6.5)
NEUT%: 78 % — ABNORMAL HIGH (ref 39.0–75.0)
Platelets: 106 10*3/uL — ABNORMAL LOW (ref 140–400)
RBC: 4.16 10*6/uL — AB (ref 4.20–5.82)
RDW: 15.6 % — AB (ref 11.0–14.6)
WBC: 10.7 10*3/uL — AB (ref 4.0–10.3)

## 2013-05-19 LAB — COMPREHENSIVE METABOLIC PANEL (CC13)
ALT: 43 U/L (ref 0–55)
ANION GAP: 10 meq/L (ref 3–11)
AST: 42 U/L — ABNORMAL HIGH (ref 5–34)
Albumin: 3.2 g/dL — ABNORMAL LOW (ref 3.5–5.0)
Alkaline Phosphatase: 646 U/L — ABNORMAL HIGH (ref 40–150)
BUN: 10.3 mg/dL (ref 7.0–26.0)
CALCIUM: 9.8 mg/dL (ref 8.4–10.4)
CHLORIDE: 105 meq/L (ref 98–109)
CO2: 28 meq/L (ref 22–29)
Creatinine: 0.8 mg/dL (ref 0.7–1.3)
Glucose: 128 mg/dl (ref 70–140)
POTASSIUM: 3.7 meq/L (ref 3.5–5.1)
SODIUM: 143 meq/L (ref 136–145)
TOTAL PROTEIN: 6.1 g/dL — AB (ref 6.4–8.3)
Total Bilirubin: 1.69 mg/dL — ABNORMAL HIGH (ref 0.20–1.20)

## 2013-05-19 MED ORDER — HEPARIN SOD (PORK) LOCK FLUSH 100 UNIT/ML IV SOLN
500.0000 [IU] | Freq: Once | INTRAVENOUS | Status: AC | PRN
  Administered 2013-05-19: 500 [IU]
  Filled 2013-05-19: qty 5

## 2013-05-19 MED ORDER — ONDANSETRON 8 MG/50ML IVPB (CHCC)
8.0000 mg | Freq: Once | INTRAVENOUS | Status: AC
  Administered 2013-05-19: 8 mg via INTRAVENOUS

## 2013-05-19 MED ORDER — ONDANSETRON 8 MG/NS 50 ML IVPB
INTRAVENOUS | Status: AC
  Filled 2013-05-19: qty 8

## 2013-05-19 MED ORDER — DEXAMETHASONE SODIUM PHOSPHATE 10 MG/ML IJ SOLN
10.0000 mg | Freq: Once | INTRAMUSCULAR | Status: AC
  Administered 2013-05-19: 10 mg via INTRAVENOUS

## 2013-05-19 MED ORDER — PACLITAXEL PROTEIN-BOUND CHEMO INJECTION 100 MG
90.0000 mg/m2 | Freq: Once | INTRAVENOUS | Status: AC
  Administered 2013-05-19: 175 mg via INTRAVENOUS
  Filled 2013-05-19: qty 35

## 2013-05-19 MED ORDER — SODIUM CHLORIDE 0.9 % IJ SOLN
10.0000 mL | INTRAMUSCULAR | Status: DC | PRN
  Administered 2013-05-19: 10 mL
  Filled 2013-05-19: qty 10

## 2013-05-19 MED ORDER — DEXAMETHASONE SODIUM PHOSPHATE 10 MG/ML IJ SOLN
INTRAMUSCULAR | Status: AC
  Filled 2013-05-19: qty 1

## 2013-05-19 MED ORDER — SODIUM CHLORIDE 0.9 % IV SOLN
Freq: Once | INTRAVENOUS | Status: AC
  Administered 2013-05-19: 13:00:00 via INTRAVENOUS

## 2013-05-19 MED ORDER — SODIUM CHLORIDE 0.9 % IV SOLN
800.0000 mg/m2 | Freq: Once | INTRAVENOUS | Status: AC
  Administered 2013-05-19: 1558 mg via INTRAVENOUS
  Filled 2013-05-19: qty 40.98

## 2013-05-19 NOTE — Patient Instructions (Signed)
We'll resume your systemic chemotherapy with gemcitabine and Abraxane but at a reduced dose.

## 2013-05-19 NOTE — Progress Notes (Signed)
[facility] [phone]   [phone]  OFFICE PROGRESS NOTE  Florina Ou, MD [address] [place] [address]  DIAGNOSIS:  1) Metastatic pancreatic adenocarcinoma presented with large mass in the care of the pancreas was metastatic liver lesions diagnosed in December of 2014. 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 Ninfa Linden.   CURRENT THERAPY: Systemic chemotherapy with gemcitabine 800 mg/M2 and Abraxane 90 mg/M2 on days, 1, 8, and 15 every 4 weeks. First dose expected on 04/21/2013.  INTERVAL HISTORY: Danny Waters 80 y.o. male returns to the clinic today for followup visit accompanied by his son. The patient started systemic chemotherapy with gemcitabine and Abraxane status post 1 dose over the first cycle but was unable to receive day 8 and 15 of the first cycle because of thrombocytopenia. He is feeling better today and ready to resume her systemic therapy. He smoked about considering palliative care and hospice at some point but the patient decided to resume treatment. He denied having any significant chest pain, shortness of breath, cough or hemoptysis. The patient denied having any nausea or vomiting, no abdominal pain, no diarrhea or constipation. He denied having any back pain. The patient denied having any weight loss or night sweats. He denied having any recent jaundice.    MEDICAL HISTORY: Past Medical History  Diagnosis Date  . Hyperlipidemia     takes Lipitor daily  . Hypertension     takes Amlodipine,Metoprolol,and Losartan daily  . Hx of colonic polyps   . Type 2 diabetes mellitus with vascular disease   . Aortic stenosis     Last ECHO Dr. Wynonia Lawman 09/24/12  AVA  0.76 assymptomatic   . CAD (coronary artery disease) 09/24/2012    Cath 07/04/2002 normal Left main, 50% stenosis proximal LAD, 99% stenosis mid LAD, 50% stenosis mid CFX, 60% stenosis proximal PDA, stent placed mid LAD  Taxus stent to mid LAD 07/04/2002   . Hypertensive heart disease     ALLERGIES:  has No Known Allergies.  MEDICATIONS:  Current Outpatient Prescriptions  Medication Sig Dispense Refill  . acetaminophen (TYLENOL) 500 MG tablet Take 500 mg by mouth every 6 (six) hours as needed for mild pain or moderate pain.      Marland Kitchen aspirin 81 MG tablet Take 81 mg by mouth daily.       . calcium-vitamin D (OSCAL WITH D) 500-200 MG-UNIT per tablet Take 1 tablet by mouth daily with breakfast.      . FREESTYLE LITE test strip       . insulin glargine (LANTUS) 100 UNIT/ML injection Inject 10 Units into the skin at bedtime.      . Lancets (FREESTYLE) lancets       . lidocaine-prilocaine (EMLA) cream Apply 1 application topically as needed. apply to port 1 hour before chemo appt.  30 g  0  . metFORMIN (GLUCOPHAGE) 500 MG tablet Take 500 mg by mouth 2 (two) times daily with a meal.      . metoprolol succinate (TOPROL-XL) 100 MG 24 hr tablet Take 100 mg by mouth every morning.       . Multiple Vitamin (MULTIVITAMIN) tablet Take  1 tablet by mouth daily.      . mupirocin ointment (BACTROBAN) 2 % Apply 1 application topically 2 (two) times daily.       . prochlorperazine (COMPAZINE) 10 MG tablet Take 1 tablet (10 mg total) by mouth every 6 (six) hours as needed for nausea or vomiting.  30 tablet  1  . silver sulfADIAZINE (SILVADENE) 1 % cream Apply 1 application topically daily.       No current facility-administered medications for this visit.    SURGICAL HISTORY:  Past Surgical History  Procedure Laterality Date  . [place]  2003  . Hernia repair  1957  . Appendectomy    . Cardiac catheterization  10+yrs ago  . Coronary stent placement  2004    Taxus stent to mid LAD  . Colonoscopy     . Melanoma excision  06/07/2011    Procedure: MELANOMA EXCISION;  Surgeon: Harl Bowie, MD;  Location: [facility];  Service: General;  Laterality: Right;  wide excision melanoma right shoulder  and left back and left upper arm   . Mastoid debridement      REVIEW OF SYSTEMS:  Constitutional: positive for anorexia and fatigue Eyes: negative Ears, nose, mouth, throat, and face: negative Respiratory: negative Cardiovascular: negative Gastrointestinal: negative Genitourinary:negative Integument/breast: negative Hematologic/lymphatic: negative Musculoskeletal:negative Neurological: negative Behavioral/Psych: negative Endocrine: negative Allergic/Immunologic: negative   PHYSICAL EXAMINATION: General appearance: alert, cooperative, fatigued and no distress Head: Normocephalic, without obvious abnormality, atraumatic Neck: no adenopathy, no JVD, supple, symmetrical, trachea midline and thyroid not enlarged, symmetric, no tenderness/mass/nodules Lymph nodes: Cervical, supraclavicular, and axillary nodes normal. Resp: clear to auscultation bilaterally Back: symmetric, no curvature. ROM normal. No CVA tenderness. Cardio: regular rate and rhythm, S1, S2 normal, no murmur, click, rub or gallop GI: soft, non-tender; bowel sounds normal; no masses,  no organomegaly Extremities: extremities normal, atraumatic, no cyanosis or edema Neurologic: Alert and oriented X 3, normal strength and tone. Normal symmetric reflexes. Normal coordination and gait  ECOG PERFORMANCE STATUS: 1 - Symptomatic but completely ambulatory  Blood pressure 120/72, pulse 74, temperature 97.3 F (36.3 C), temperature source Oral, resp. rate 18, height 5\' 8"  (1.727 m), weight 175 lb (79.379 kg), SpO2 95.00%.  LABORATORY DATA: Lab Results  Component Value Date   WBC 10.7* 05/19/2013   HGB 13.3 05/19/2013   HCT 40.1 05/19/2013   MCV 96.5 05/19/2013   PLT 106* 05/19/2013      Chemistry      Component Value Date/Time    NA 143 05/19/2013 1043   NA 137 04/10/2013 1210   K 3.7 05/19/2013 1043   K 3.3* 04/10/2013 1210   CL 94* 04/10/2013 1210   CL 94* 06/25/2012 1008   CO2 28 05/19/2013 1043   CO2 29 04/10/2013 1210   BUN 10.3 05/19/2013 1043   BUN 18 04/10/2013 1210   CREATININE 0.8 05/19/2013 1043   CREATININE 1.09 04/10/2013 1210      Component Value Date/Time   CALCIUM 9.8 05/19/2013 1043   CALCIUM 10.9* 04/10/2013 1210   ALKPHOS 646* 05/19/2013 1043   ALKPHOS 373* 03/29/2013 0138   AST 42* 05/19/2013 1043   AST 48* 03/29/2013 0138   ALT 43 05/19/2013 1043   ALT 63* 03/29/2013 0138   BILITOT 1.69* 05/19/2013 1043   BILITOT 0.8 03/29/2013 0138       RADIOGRAPHIC STUDIES:  ASSESSMENT AND PLAN: This is a very pleasant 80 years old white male with history of a stage II malignant melanoma of the  left arm status post excision and recently diagnosed with metastatic pancreatic adenocarcinoma with a large mass in the tail of the pancreas and several metastatic liver lesions as well as adrenal and pulmonary metastasis. The patient is currently undergoing systemic chemotherapy with gemcitabine and Abraxane status post 1 dose and was unable to receive day 8 and 15 of the first cycle secondary to his thrombocytopenia. The patient would like to resume his treatment and I will reduce the dose of Abraxane to 90 mg/M2 and gemcitabine at 800 mg/M2 on days 1, 8 and 15 every 4 weeks. Here to start cycle #2 today. He would come back for followup visit in 2 weeks for reevaluation and management any adverse effect of his treatment. The patient was advised to call immediately if he has any concerning symptoms in the interval.  The patient voices understanding of current disease status and treatment options and is in agreement with the current care plan.  All questions were answered. The patient knows to call the clinic with any problems, questions or concerns. We can certainly see the patient much sooner if necessary.  I spent 15  minutes counseling the patient face to face. The total time spent in the appointment was 25 minutes.  Disclaimer: This note was dictated with voice recognition software. Similar sounding words can inadvertently be transcribed and may not be corrected upon review.

## 2013-05-19 NOTE — Patient Instructions (Signed)
[facility] Discharge Instructions for Patients Receiving Chemotherapy  Today you received the following chemotherapy agents: Abraxane, Gemzar  To help prevent nausea and vomiting after your treatment, we encourage you to take your nausea medication as prescribed.    If you develop nausea and vomiting that is not controlled by your nausea medication, call the clinic.   BELOW ARE SYMPTOMS THAT SHOULD BE REPORTED IMMEDIATELY:  *FEVER GREATER THAN 100.5 F  *CHILLS WITH OR WITHOUT FEVER  NAUSEA AND VOMITING THAT IS NOT CONTROLLED WITH YOUR NAUSEA MEDICATION  *UNUSUAL SHORTNESS OF BREATH  *UNUSUAL BRUISING OR BLEEDING  TENDERNESS IN MOUTH AND THROAT WITH OR WITHOUT PRESENCE OF ULCERS  *URINARY PROBLEMS  *BOWEL PROBLEMS  UNUSUAL RASH Items with * indicate a potential emergency and should be followed up as soon as possible.  Feel free to call the clinic you have any questions or concerns. The clinic phone number is [phone].  Nanoparticle Albumin-Bound Paclitaxel injection - Abraxane What is this medicine? NANOPARTICLE ALBUMIN-BOUND PACLITAXEL (Na no PAHR ti kuhl al BYOO muhn-bound PAK li TAX el) is a chemotherapy drug. It targets fast dividing cells, like cancer cells, and causes these cells to die. This medicine is used to treat advanced breast cancer and advanced lung cancer. This medicine may be used for other purposes; ask your health care provider or pharmacist if you have questions. COMMON BRAND NAME(S): Abraxane What should I tell my health care provider before I take this medicine? They need to know if you have any of these conditions: -kidney disease -liver disease -low blood counts, like low platelets, red blood cells, or white blood cells -recent or ongoing radiation therapy -an unusual or allergic reaction to paclitaxel, albumin, other chemotherapy, other medicines, foods, dyes, or preservatives -pregnant or trying to get  pregnant -breast-feeding How should I use this medicine? This drug is given as an infusion into a vein. It is administered in a hospital or clinic by a specially trained health care professional. Talk to your pediatrician regarding the use of this medicine in children. Special care may be needed. Overdosage: If you think you have taken too much of this medicine contact a poison control center or emergency room at once. NOTE: This medicine is only for you. Do not share this medicine with others. What if I miss a dose? It is important not to miss your dose. Call your doctor or health care professional if you are unable to keep an appointment. What may interact with this medicine? -cyclosporine -diazepam -ketoconazole -medicines to increase blood counts like filgrastim, pegfilgrastim, sargramostim -other chemotherapy drugs like cisplatin, doxorubicin, epirubicin, etoposide, teniposide, vincristine -quinidine -testosterone -vaccines -verapamil Talk to your doctor or health care professional before taking any of these medicines: -acetaminophen -aspirin -ibuprofen -ketoprofen -naproxen This list may not describe all possible interactions. Give your health care provider a list of all the medicines, herbs, non-prescription drugs, or dietary supplements you use. Also tell them if you smoke, drink alcohol, or use illegal drugs. Some items may interact with your medicine. What should I watch for while using this medicine? Your condition will be monitored carefully while you are receiving this medicine. You will need important blood work done while you are taking this medicine. This drug may make you feel generally unwell. This is not uncommon, as chemotherapy can affect healthy cells as well as cancer cells. Report any side effects. Continue your course of treatment even though you feel ill unless your doctor tells you to stop. In  some cases, you may be given additional medicines to help with side  effects. Follow all directions for their use. Call your doctor or health care professional for advice if you get a fever, chills or sore throat, or other symptoms of a cold or flu. Do not treat yourself. This drug decreases your body's ability to fight infections. Try to avoid being around people who are sick. This medicine may increase your risk to bruise or bleed. Call your doctor or health care professional if you notice any unusual bleeding. Be careful brushing and flossing your teeth or using a toothpick because you may get an infection or bleed more easily. If you have any dental work done, tell your dentist you are receiving this medicine. Avoid taking products that contain aspirin, acetaminophen, ibuprofen, naproxen, or ketoprofen unless instructed by your doctor. These medicines may hide a fever. Do not become pregnant while taking this medicine. Women should inform their doctor if they wish to become pregnant or think they might be pregnant. There is a potential for serious side effects to an unborn child. Talk to your health care professional or pharmacist for more information. Do not breast-feed an infant while taking this medicine. Men are advised not to father a child while receiving this medicine. What side effects may I notice from receiving this medicine? Side effects that you should report to your doctor or health care professional as soon as possible: -allergic reactions like skin rash, itching or hives, swelling of the face, lips, or tongue -low blood counts - This drug may decrease the number of white blood cells, red blood cells and platelets. You may be at increased risk for infections and bleeding. -signs of infection - fever or chills, cough, sore throat, pain or difficulty passing urine -signs of decreased platelets or bleeding - bruising, pinpoint red spots on the skin, black, tarry stools, nosebleeds -signs of decreased red blood cells - unusually weak or tired, fainting  spells, lightheadedness -breathing problems -changes in vision -chest pain -high or low blood pressure -mouth sores -nausea and vomiting -pain, swelling, redness or irritation at the injection site -pain, tingling, numbness in the hands or feet -slow or irregular heartbeat -swelling of the ankle, feet, hands Side effects that usually do not require medical attention (report to your doctor or health care professional if they continue or are bothersome): -aches, pains -changes in the color of fingernails -diarrhea -hair loss -loss of appetite This list may not describe all possible side effects. Call your doctor for medical advice about side effects. You may report side effects to FDA at 1-800-FDA-1088. Where should I keep my medicine? This drug is given in a hospital or clinic and will not be stored at home. NOTE: This sheet is a summary. It may not cover all possible information. If you have questions about this medicine, talk to your doctor, pharmacist, or health care provider.  2014, Elsevier/Gold Standard. (2012-06-02 16:48:50)  Gemcitabine injection - Gemzar What is this medicine? GEMCITABINE (jem SIT a been) is a chemotherapy drug. This medicine is used to treat many types of cancer like breast cancer, lung cancer, pancreatic cancer, and ovarian cancer. This medicine may be used for other purposes; ask your health care provider or pharmacist if you have questions. COMMON BRAND NAME(S): Gemzar What should I tell my health care provider before I take this medicine? They need to know if you have any of these conditions: -blood disorders -infection -kidney disease -liver disease -recent or ongoing radiation therapy -an  unusual or allergic reaction to gemcitabine, other chemotherapy, other medicines, foods, dyes, or preservatives -pregnant or trying to get pregnant -breast-feeding How should I use this medicine? This drug is given as an infusion into a vein. It is administered  in a hospital or clinic by a specially trained health care professional. Talk to your pediatrician regarding the use of this medicine in children. Special care may be needed. Overdosage: If you think you have taken too much of this medicine contact a poison control center or emergency room at once. NOTE: This medicine is only for you. Do not share this medicine with others. What if I miss a dose? It is important not to miss your dose. Call your doctor or health care professional if you are unable to keep an appointment. What may interact with this medicine? -medicines to increase blood counts like filgrastim, pegfilgrastim, sargramostim -some other chemotherapy drugs like cisplatin -vaccines Talk to your doctor or health care professional before taking any of these medicines: -acetaminophen -aspirin -ibuprofen -ketoprofen -naproxen This list may not describe all possible interactions. Give your health care provider a list of all the medicines, herbs, non-prescription drugs, or dietary supplements you use. Also tell them if you smoke, drink alcohol, or use illegal drugs. Some items may interact with your medicine. What should I watch for while using this medicine? Visit your doctor for checks on your progress. This drug may make you feel generally unwell. This is not uncommon, as chemotherapy can affect healthy cells as well as cancer cells. Report any side effects. Continue your course of treatment even though you feel ill unless your doctor tells you to stop. In some cases, you may be given additional medicines to help with side effects. Follow all directions for their use. Call your doctor or health care professional for advice if you get a fever, chills or sore throat, or other symptoms of a cold or flu. Do not treat yourself. This drug decreases your body's ability to fight infections. Try to avoid being around people who are sick. This medicine may increase your risk to bruise or bleed.  Call your doctor or health care professional if you notice any unusual bleeding. Be careful brushing and flossing your teeth or using a toothpick because you may get an infection or bleed more easily. If you have any dental work done, tell your dentist you are receiving this medicine. Avoid taking products that contain aspirin, acetaminophen, ibuprofen, naproxen, or ketoprofen unless instructed by your doctor. These medicines may hide a fever. Women should inform their doctor if they wish to become pregnant or think they might be pregnant. There is a potential for serious side effects to an unborn child. Talk to your health care professional or pharmacist for more information. Do not breast-feed an infant while taking this medicine. What side effects may I notice from receiving this medicine? Side effects that you should report to your doctor or health care professional as soon as possible: -allergic reactions like skin rash, itching or hives, swelling of the face, lips, or tongue -low blood counts - this medicine may decrease the number of white blood cells, red blood cells and platelets. You may be at increased risk for infections and bleeding. -signs of infection - fever or chills, cough, sore throat, pain or difficulty passing urine -signs of decreased platelets or bleeding - bruising, pinpoint red spots on the skin, black, tarry stools, blood in the urine -signs of decreased red blood cells - unusually weak or tired,  fainting spells, lightheadedness -breathing problems -chest pain -mouth sores -nausea and vomiting -pain, swelling, redness at site where injected -pain, tingling, numbness in the hands or feet -stomach pain -swelling of ankles, feet, hands -unusual bleeding Side effects that usually do not require medical attention (report to your doctor or health care professional if they continue or are bothersome): -constipation -diarrhea -hair loss -loss of appetite -stomach  upset This list may not describe all possible side effects. Call your doctor for medical advice about side effects. You may report side effects to FDA at 1-800-FDA-1088. Where should I keep my medicine? This drug is given in a hospital or clinic and will not be stored at home. NOTE: This sheet is a summary. It may not cover all possible information. If you have questions about this medicine, talk to your doctor, pharmacist, or health care provider.  2014, Elsevier/Gold Standard. (2007-08-19 18:45:54)

## 2013-05-19 NOTE — Telephone Encounter (Signed)
gave pt appt for lab,md and chemo fotr february and March 2015

## 2013-05-26 ENCOUNTER — Other Ambulatory Visit (HOSPITAL_BASED_OUTPATIENT_CLINIC_OR_DEPARTMENT_OTHER): Payer: Medicare Other

## 2013-05-26 ENCOUNTER — Ambulatory Visit: Payer: Medicare Other

## 2013-05-26 DIAGNOSIS — C259 Malignant neoplasm of pancreas, unspecified: Secondary | ICD-10-CM

## 2013-05-26 DIAGNOSIS — C252 Malignant neoplasm of tail of pancreas: Secondary | ICD-10-CM

## 2013-05-26 DIAGNOSIS — C787 Secondary malignant neoplasm of liver and intrahepatic bile duct: Secondary | ICD-10-CM

## 2013-05-26 LAB — CBC WITH DIFFERENTIAL/PLATELET
BASO%: 0.3 % (ref 0.0–2.0)
Basophils Absolute: 0 10*3/uL (ref 0.0–0.1)
EOS%: 1.3 % (ref 0.0–7.0)
Eosinophils Absolute: 0.1 10*3/uL (ref 0.0–0.5)
HEMATOCRIT: 37.4 % — AB (ref 38.4–49.9)
HGB: 12.5 g/dL — ABNORMAL LOW (ref 13.0–17.1)
LYMPH#: 1.4 10*3/uL (ref 0.9–3.3)
LYMPH%: 20.8 % (ref 14.0–49.0)
MCH: 31.8 pg (ref 27.2–33.4)
MCHC: 33.4 g/dL (ref 32.0–36.0)
MCV: 95.2 fL (ref 79.3–98.0)
MONO#: 0.5 10*3/uL (ref 0.1–0.9)
MONO%: 6.7 % (ref 0.0–14.0)
NEUT#: 4.7 10*3/uL (ref 1.5–6.5)
NEUT%: 70.9 % (ref 39.0–75.0)
NRBC: 0 % (ref 0–0)
Platelets: 59 10*3/uL — ABNORMAL LOW (ref 140–400)
RBC: 3.93 10*6/uL — AB (ref 4.20–5.82)
RDW: 15.5 % — ABNORMAL HIGH (ref 11.0–14.6)
WBC: 6.7 10*3/uL (ref 4.0–10.3)

## 2013-05-26 NOTE — Progress Notes (Signed)
Per Dr. Julien Nordmann, chemotherapy is cancelled for today due to platelet count of 59. Patient and son notified of this and given a copy of the AVS. Patient will return as scheduled next week and is agreeable to this. Cindi Carbon, RN

## 2013-06-02 ENCOUNTER — Ambulatory Visit (HOSPITAL_BASED_OUTPATIENT_CLINIC_OR_DEPARTMENT_OTHER): Payer: Medicare Other | Admitting: Physician Assistant

## 2013-06-02 ENCOUNTER — Telehealth: Payer: Self-pay | Admitting: *Deleted

## 2013-06-02 ENCOUNTER — Ambulatory Visit (HOSPITAL_BASED_OUTPATIENT_CLINIC_OR_DEPARTMENT_OTHER): Payer: Medicare Other

## 2013-06-02 ENCOUNTER — Encounter: Payer: Self-pay | Admitting: Physician Assistant

## 2013-06-02 ENCOUNTER — Other Ambulatory Visit: Payer: Medicare Other

## 2013-06-02 ENCOUNTER — Other Ambulatory Visit (HOSPITAL_BASED_OUTPATIENT_CLINIC_OR_DEPARTMENT_OTHER): Payer: Medicare Other

## 2013-06-02 ENCOUNTER — Telehealth: Payer: Self-pay | Admitting: Internal Medicine

## 2013-06-02 VITALS — BP 123/76 | HR 61 | Temp 97.4°F | Resp 18 | Ht 68.0 in | Wt 171.7 lb

## 2013-06-02 DIAGNOSIS — C7970 Secondary malignant neoplasm of unspecified adrenal gland: Secondary | ICD-10-CM

## 2013-06-02 DIAGNOSIS — Z85820 Personal history of malignant melanoma of skin: Secondary | ICD-10-CM

## 2013-06-02 DIAGNOSIS — C787 Secondary malignant neoplasm of liver and intrahepatic bile duct: Secondary | ICD-10-CM

## 2013-06-02 DIAGNOSIS — C252 Malignant neoplasm of tail of pancreas: Secondary | ICD-10-CM

## 2013-06-02 DIAGNOSIS — Z5111 Encounter for antineoplastic chemotherapy: Secondary | ICD-10-CM

## 2013-06-02 DIAGNOSIS — C259 Malignant neoplasm of pancreas, unspecified: Secondary | ICD-10-CM

## 2013-06-02 DIAGNOSIS — C7800 Secondary malignant neoplasm of unspecified lung: Secondary | ICD-10-CM

## 2013-06-02 LAB — CBC WITH DIFFERENTIAL/PLATELET
BASO%: 0.2 % (ref 0.0–2.0)
Basophils Absolute: 0 10*3/uL (ref 0.0–0.1)
EOS%: 1.3 % (ref 0.0–7.0)
Eosinophils Absolute: 0.2 10*3/uL (ref 0.0–0.5)
HCT: 39.3 % (ref 38.4–49.9)
HGB: 12.9 g/dL — ABNORMAL LOW (ref 13.0–17.1)
LYMPH%: 12.4 % — ABNORMAL LOW (ref 14.0–49.0)
MCH: 31.7 pg (ref 27.2–33.4)
MCHC: 32.8 g/dL (ref 32.0–36.0)
MCV: 96.6 fL (ref 79.3–98.0)
MONO#: 1 10*3/uL — ABNORMAL HIGH (ref 0.1–0.9)
MONO%: 8.8 % (ref 0.0–14.0)
NEUT#: 8.8 10*3/uL — ABNORMAL HIGH (ref 1.5–6.5)
NEUT%: 77.3 % — ABNORMAL HIGH (ref 39.0–75.0)
Platelets: 177 10*3/uL (ref 140–400)
RBC: 4.07 10*6/uL — ABNORMAL LOW (ref 4.20–5.82)
RDW: 16.4 % — ABNORMAL HIGH (ref 11.0–14.6)
WBC: 11.3 10*3/uL — ABNORMAL HIGH (ref 4.0–10.3)
lymph#: 1.4 10*3/uL (ref 0.9–3.3)
nRBC: 0 % (ref 0–0)

## 2013-06-02 LAB — COMPREHENSIVE METABOLIC PANEL (CC13)
ALT: 34 U/L (ref 0–55)
AST: 37 U/L — ABNORMAL HIGH (ref 5–34)
Albumin: 3.1 g/dL — ABNORMAL LOW (ref 3.5–5.0)
Alkaline Phosphatase: 736 U/L — ABNORMAL HIGH (ref 40–150)
Anion Gap: 9 mEq/L (ref 3–11)
BUN: 10.8 mg/dL (ref 7.0–26.0)
CO2: 27 mEq/L (ref 22–29)
Calcium: 9.5 mg/dL (ref 8.4–10.4)
Chloride: 107 mEq/L (ref 98–109)
Creatinine: 0.7 mg/dL (ref 0.7–1.3)
Glucose: 124 mg/dl (ref 70–140)
Potassium: 3.8 mEq/L (ref 3.5–5.1)
Sodium: 142 mEq/L (ref 136–145)
Total Bilirubin: 1.67 mg/dL — ABNORMAL HIGH (ref 0.20–1.20)
Total Protein: 5.8 g/dL — ABNORMAL LOW (ref 6.4–8.3)

## 2013-06-02 MED ORDER — SODIUM CHLORIDE 0.9 % IV SOLN
800.0000 mg/m2 | Freq: Once | INTRAVENOUS | Status: AC
  Administered 2013-06-02: 1558 mg via INTRAVENOUS
  Filled 2013-06-02: qty 41

## 2013-06-02 MED ORDER — DEXAMETHASONE SODIUM PHOSPHATE 10 MG/ML IJ SOLN
INTRAMUSCULAR | Status: AC
  Filled 2013-06-02: qty 1

## 2013-06-02 MED ORDER — DEXAMETHASONE SODIUM PHOSPHATE 10 MG/ML IJ SOLN
10.0000 mg | Freq: Once | INTRAMUSCULAR | Status: AC
  Administered 2013-06-02: 10 mg via INTRAVENOUS

## 2013-06-02 MED ORDER — ONDANSETRON 8 MG/NS 50 ML IVPB
INTRAVENOUS | Status: AC
  Filled 2013-06-02: qty 8

## 2013-06-02 MED ORDER — SODIUM CHLORIDE 0.9 % IJ SOLN
10.0000 mL | INTRAMUSCULAR | Status: DC | PRN
  Administered 2013-06-02: 10 mL
  Filled 2013-06-02: qty 10

## 2013-06-02 MED ORDER — ONDANSETRON 8 MG/50ML IVPB (CHCC)
8.0000 mg | Freq: Once | INTRAVENOUS | Status: AC
  Administered 2013-06-02: 8 mg via INTRAVENOUS

## 2013-06-02 MED ORDER — SODIUM CHLORIDE 0.9 % IV SOLN
Freq: Once | INTRAVENOUS | Status: AC
  Administered 2013-06-02: 14:00:00 via INTRAVENOUS

## 2013-06-02 MED ORDER — PACLITAXEL PROTEIN-BOUND CHEMO INJECTION 100 MG
90.0000 mg/m2 | Freq: Once | INTRAVENOUS | Status: AC
  Administered 2013-06-02: 175 mg via INTRAVENOUS
  Filled 2013-06-02: qty 35

## 2013-06-02 MED ORDER — HEPARIN SOD (PORK) LOCK FLUSH 100 UNIT/ML IV SOLN
500.0000 [IU] | Freq: Once | INTRAVENOUS | Status: AC | PRN
  Administered 2013-06-02: 500 [IU]
  Filled 2013-06-02: qty 5

## 2013-06-02 NOTE — Progress Notes (Addendum)
[facility] [phone]   [phone]  SHARED VISIT PROGRESS NOTE  Herb Grays, MD [address] [place] [address]  DIAGNOSIS:  1) Metastatic pancreatic adenocarcinoma presented with large mass in the care of the pancreas was metastatic liver lesions diagnosed in December of 2014. 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 Magnus Ivan.   CURRENT THERAPY: Systemic chemotherapy with gemcitabine 800 mg/M2 and Abraxane 90 mg/M2 on days, 1, 8, and 15 every 4 weeks. First dose expected on 04/21/2013. Status post 1 cycle and day 1 of cycle #2  INTERVAL HISTORY: Danny Waters 80 y.o. male returns to the clinic today for followup visit accompanied by his son. The patient is currently recieving systemic chemotherapy with gemcitabine and Abraxane status post 1 dose over the first cycle but was unable to receive day 8 and 15 of the first cycle because of thrombocytopenia. He is feeling better today and ready to resume her systemic therapy. He talked about considering palliative care and hospice at some point but the patient decided to resume treatment. He denied having any significant chest pain, shortness of breath, cough or hemoptysis. The patient denied having any nausea or vomiting, no abdominal pain, no diarrhea or constipation. He denied having any back pain. The patient denied having any weight loss or night sweats. He denied having any recent jaundice. Son notes  increased forgetfulness and Mr. Galler, particularly of recent events. He got lost while driving.   MEDICAL HISTORY: Past Medical History  Diagnosis Date  . Hyperlipidemia     takes Lipitor daily  . Hypertension     takes  Amlodipine,Metoprolol,and Losartan daily  . Hx of colonic polyps   . Type 2 diabetes mellitus with vascular disease   . Aortic stenosis     Last ECHO Dr. Donnie Aho 09/24/12  AVA 0.76 assymptomatic   . CAD (coronary artery disease) 09/24/2012    Cath 07/04/2002 normal Left main, 50% stenosis proximal LAD, 99% stenosis mid LAD, 50% stenosis mid CFX, 60% stenosis proximal PDA, stent placed mid LAD  Taxus stent to mid LAD 07/04/2002   . Hypertensive heart disease     ALLERGIES:  has No Known Allergies.  MEDICATIONS:  Current Outpatient Prescriptions  Medication Sig Dispense Refill  . acetaminophen (TYLENOL) 500 MG tablet Take 500 mg by mouth every 6 (six) hours as needed for mild pain or moderate pain.      Marland Kitchen aspirin 81 MG tablet Take 81 mg by mouth daily.       . calcium-vitamin D (OSCAL WITH D) 500-200 MG-UNIT per tablet Take 1 tablet by mouth daily with breakfast.      . FREESTYLE LITE test strip       . insulin glargine (LANTUS) 100 UNIT/ML injection Inject 10 Units into the skin at bedtime.      . Lancets (FREESTYLE) lancets       . lidocaine-prilocaine (EMLA) cream Apply 1 application topically as needed. apply to port 1 hour before chemo appt.  30 g  0  . metFORMIN (GLUCOPHAGE) 500 MG tablet Take 500 mg by mouth 2 (two) times daily with a meal.      .  metoprolol succinate (TOPROL-XL) 100 MG 24 hr tablet Take 100 mg by mouth every morning.       . Multiple Vitamin (MULTIVITAMIN) tablet Take 1 tablet by mouth daily.      . prochlorperazine (COMPAZINE) 10 MG tablet Take 1 tablet (10 mg total) by mouth every 6 (six) hours as needed for nausea or vomiting.  30 tablet  1  . mupirocin ointment (BACTROBAN) 2 % Apply 1 application topically 2 (two) times daily.       . silver sulfADIAZINE (SILVADENE) 1 % cream Apply 1 application topically daily.       No current facility-administered medications for this visit.   Facility-Administered Medications Ordered in Other Visits  Medication Dose Route  Frequency Provider Last Rate Last Dose  . Gemcitabine HCl (GEMZAR) 1,558 mg in sodium chloride 0.9 % 100 mL chemo infusion  800 mg/m2 (Treatment Plan Actual) Intravenous Once Curt Bears, MD 282 mL/hr at 06/02/13 1629 1,558 mg at 06/02/13 1629  . heparin lock flush 100 unit/mL  500 Units Intracatheter Once PRN Curt Bears, MD      . sodium chloride 0.9 % injection 10 mL  10 mL Intracatheter PRN Curt Bears, MD        SURGICAL HISTORY:  Past Surgical History  Procedure Laterality Date  . [place]  2003  . Hernia repair  1957  . Appendectomy    . Cardiac catheterization  10+yrs ago  . Coronary stent placement  2004    Taxus stent to mid LAD  . Colonoscopy    . Melanoma excision  06/07/2011    Procedure: MELANOMA EXCISION;  Surgeon: Harl Bowie, MD;  Location: [facility];  Service: General;  Laterality: Right;  wide excision melanoma right shoulder  and left back and left upper arm   . Mastoid debridement      REVIEW OF SYSTEMS:  Constitutional: positive for anorexia and fatigue Eyes: negative Ears, nose, mouth, throat, and face: negative Respiratory: negative Cardiovascular: negative Gastrointestinal: negative Genitourinary:negative Integument/breast: negative Hematologic/lymphatic: negative Musculoskeletal:negative Neurological: negative Behavioral/Psych: negative Endocrine: negative Allergic/Immunologic: negative   PHYSICAL EXAMINATION: General appearance: alert, cooperative, fatigued and no distress Head: Normocephalic, without obvious abnormality, atraumatic Neck: no adenopathy, no JVD, supple, symmetrical, trachea midline and thyroid not enlarged, symmetric, no tenderness/mass/nodules Lymph nodes: Cervical, supraclavicular, and axillary nodes normal. Resp: clear to auscultation bilaterally Back: symmetric, no curvature. ROM normal. No CVA tenderness. Cardio: regular rate and rhythm, S1, S2 normal, no murmur, click, rub or gallop GI: soft,  non-tender; bowel sounds normal; no masses,  no organomegaly Extremities: extremities normal, atraumatic, no cyanosis or edema Neurologic: Alert and oriented X 3, normal strength and tone. Normal symmetric reflexes. Normal coordination and gait  ECOG PERFORMANCE STATUS: 1 - Symptomatic but completely ambulatory  Blood pressure 123/76, pulse 61, temperature 97.4 F (36.3 C), temperature source Oral, resp. rate 18, height 5\' 8"  (1.727 m), weight 171 lb 11.2 oz (77.883 kg).  LABORATORY DATA: Lab Results  Component Value Date   WBC 11.3* 06/02/2013   HGB 12.9* 06/02/2013   HCT 39.3 06/02/2013   MCV 96.6 06/02/2013   PLT 177 06/02/2013      Chemistry      Component Value Date/Time   NA 142 06/02/2013 1129   NA 137 04/10/2013 1210   K 3.8 06/02/2013 1129   K 3.3* 04/10/2013 1210   CL 94* 04/10/2013 1210   CL 94* 06/25/2012 1008   CO2 27 06/02/2013 1129   CO2 29 04/10/2013 1210  BUN 10.8 06/02/2013 1129   BUN 18 04/10/2013 1210   CREATININE 0.7 06/02/2013 1129   CREATININE 1.09 04/10/2013 1210      Component Value Date/Time   CALCIUM 9.5 06/02/2013 1129   CALCIUM 10.9* 04/10/2013 1210   ALKPHOS 736* 06/02/2013 1129   ALKPHOS 373* 03/29/2013 0138   AST 37* 06/02/2013 1129   AST 48* 03/29/2013 0138   ALT 34 06/02/2013 1129   ALT 63* 03/29/2013 0138   BILITOT 1.67* 06/02/2013 1129   BILITOT 0.8 03/29/2013 0138       RADIOGRAPHIC STUDIES:  ASSESSMENT AND PLAN: This is a very pleasant 80 years old white male with history of a stage II malignant melanoma of the left arm status post excision and recently diagnosed with metastatic pancreatic adenocarcinoma with a large mass in the tail of the pancreas and several metastatic liver lesions as well as adrenal and pulmonary metastasis. The patient is currently undergoing systemic chemotherapy with gemcitabine and Abraxane status post 1 dose and was unable to receive day 8 and 15 of the first cycle secondary to his thrombocytopenia. Starting with cycle  #2 forward she is receiving Abraxane 90 mg/M2 and gemcitabine at 800 mg/M2 on days 1, 8 and 15 every 4 weeks. He missed day 8 due to to thrombocytopenia. Patient was discussed with and also seen by Dr. Julien Nordmann. Mr. Boorman was advised to discontinue driving. He will return for a followup visit in 2 weeks for reevaluation and management any adverse effect of his treatment, pror to the start of cycle #3. The patient was advised to call immediately if he has any concerning symptoms in the interval.  The patient voices understanding of current disease status and treatment options and is in agreement with the current care plan.  All questions were answered. The patient knows to call the clinic with any problems, questions or concerns. We can certainly see the patient much sooner if necessary.  Carlton Adam PA-C ADDENDUM:  Hematology/Oncology Attending:  I had a face to face encounter with the patient. I recommended his care plan. This is a very pleasant 80 years old white male with metastatic pancreatic adenocarcinoma currently undergoing systemic chemotherapy was gemcitabine and Abraxane status post 1 cycle. He tolerated his treatment fairly well except for thrombocytopenia and the patient missed day 8 of the first cycle secondary to his low platelets. I recommended for the patient to proceed with his chemotherapy today as scheduled. He would come back for followup visit in 2 weeks with the start of cycle #3. He was advised to call immediately if he has any concerning symptoms in the interval.  Disclaimer: This note was dictated with voice recognition software. Similar sounding words can inadvertently be transcribed and may not be corrected upon review. Eilleen Kempf., MD 06/07/2013

## 2013-06-02 NOTE — Telephone Encounter (Signed)
gave pt appt for lab,md and chemo emailed michelle regarding chemo adjustment for 06/16/13

## 2013-06-02 NOTE — Telephone Encounter (Signed)
Per staff message I have adjusted 2/24 appt

## 2013-06-02 NOTE — Patient Instructions (Signed)
[facility] Discharge Instructions for Patients Receiving Chemotherapy  Today you received the following chemotherapy agents: Abraxane and Gemzar   To help prevent nausea and vomiting after your treatment, we encourage you to take your nausea medication as prescribed. You received Zofran and Decadron in the infusion room prior to chemotherapy.    If you develop nausea and vomiting that is not controlled by your nausea medication, call the clinic.   BELOW ARE SYMPTOMS THAT SHOULD BE REPORTED IMMEDIATELY:  *FEVER GREATER THAN 100.5 F  *CHILLS WITH OR WITHOUT FEVER  NAUSEA AND VOMITING THAT IS NOT CONTROLLED WITH YOUR NAUSEA MEDICATION  *UNUSUAL SHORTNESS OF BREATH  *UNUSUAL BRUISING OR BLEEDING  TENDERNESS IN MOUTH AND THROAT WITH OR WITHOUT PRESENCE OF ULCERS  *URINARY PROBLEMS  *BOWEL PROBLEMS  UNUSUAL RASH Items with * indicate a potential emergency and should be followed up as soon as possible.  Feel free to call the clinic you have any questions or concerns. The clinic phone number is [phone].

## 2013-06-03 ENCOUNTER — Telehealth: Payer: Self-pay | Admitting: Internal Medicine

## 2013-06-03 NOTE — Telephone Encounter (Signed)
mailed appt to ptmailed appt to pt

## 2013-06-03 NOTE — Telephone Encounter (Signed)
Called pt,left message regarding chemo,md and labs for March and  February

## 2013-06-04 NOTE — Patient Instructions (Signed)
You are advised not to drive Follow up in 2 weeks

## 2013-06-09 ENCOUNTER — Other Ambulatory Visit: Payer: Medicare Other

## 2013-06-16 ENCOUNTER — Other Ambulatory Visit: Payer: Medicare Other

## 2013-06-16 ENCOUNTER — Ambulatory Visit: Payer: Medicare Other

## 2013-06-16 ENCOUNTER — Ambulatory Visit: Payer: Medicare Other | Admitting: Internal Medicine

## 2013-06-23 ENCOUNTER — Other Ambulatory Visit (HOSPITAL_BASED_OUTPATIENT_CLINIC_OR_DEPARTMENT_OTHER): Payer: Medicare Other

## 2013-06-23 ENCOUNTER — Encounter: Payer: Self-pay | Admitting: Physician Assistant

## 2013-06-23 ENCOUNTER — Other Ambulatory Visit: Payer: Self-pay | Admitting: Physician Assistant

## 2013-06-23 ENCOUNTER — Ambulatory Visit (HOSPITAL_BASED_OUTPATIENT_CLINIC_OR_DEPARTMENT_OTHER): Payer: Medicare Other

## 2013-06-23 ENCOUNTER — Ambulatory Visit (HOSPITAL_BASED_OUTPATIENT_CLINIC_OR_DEPARTMENT_OTHER): Payer: Medicare Other | Admitting: Physician Assistant

## 2013-06-23 ENCOUNTER — Other Ambulatory Visit: Payer: Self-pay | Admitting: Internal Medicine

## 2013-06-23 VITALS — BP 113/78 | HR 97 | Temp 96.9°F | Resp 20

## 2013-06-23 DIAGNOSIS — C7970 Secondary malignant neoplasm of unspecified adrenal gland: Secondary | ICD-10-CM

## 2013-06-23 DIAGNOSIS — C259 Malignant neoplasm of pancreas, unspecified: Secondary | ICD-10-CM

## 2013-06-23 DIAGNOSIS — C7800 Secondary malignant neoplasm of unspecified lung: Secondary | ICD-10-CM

## 2013-06-23 DIAGNOSIS — Z85820 Personal history of malignant melanoma of skin: Secondary | ICD-10-CM

## 2013-06-23 DIAGNOSIS — C252 Malignant neoplasm of tail of pancreas: Secondary | ICD-10-CM

## 2013-06-23 DIAGNOSIS — C787 Secondary malignant neoplasm of liver and intrahepatic bile duct: Secondary | ICD-10-CM

## 2013-06-23 DIAGNOSIS — Z5111 Encounter for antineoplastic chemotherapy: Secondary | ICD-10-CM

## 2013-06-23 LAB — COMPREHENSIVE METABOLIC PANEL (CC13)
ALT: 43 U/L (ref 0–55)
ANION GAP: 13 meq/L — AB (ref 3–11)
AST: 51 U/L — ABNORMAL HIGH (ref 5–34)
Albumin: 3.3 g/dL — ABNORMAL LOW (ref 3.5–5.0)
Alkaline Phosphatase: 681 U/L — ABNORMAL HIGH (ref 40–150)
BUN: 14.4 mg/dL (ref 7.0–26.0)
CO2: 27 meq/L (ref 22–29)
CREATININE: 0.9 mg/dL (ref 0.7–1.3)
Calcium: 10.3 mg/dL (ref 8.4–10.4)
Chloride: 104 mEq/L (ref 98–109)
GLUCOSE: 128 mg/dL (ref 70–140)
Potassium: 4.1 mEq/L (ref 3.5–5.1)
Sodium: 144 mEq/L (ref 136–145)
Total Bilirubin: 2.01 mg/dL — ABNORMAL HIGH (ref 0.20–1.20)
Total Protein: 6.6 g/dL (ref 6.4–8.3)

## 2013-06-23 LAB — CBC WITH DIFFERENTIAL/PLATELET
BASO%: 1 % (ref 0.0–2.0)
Basophils Absolute: 0.1 10*3/uL (ref 0.0–0.1)
EOS ABS: 0.1 10*3/uL (ref 0.0–0.5)
EOS%: 0.7 % (ref 0.0–7.0)
HEMATOCRIT: 41.2 % (ref 38.4–49.9)
HGB: 13.2 g/dL (ref 13.0–17.1)
LYMPH%: 10.7 % — AB (ref 14.0–49.0)
MCH: 31.5 pg (ref 27.2–33.4)
MCHC: 32.1 g/dL (ref 32.0–36.0)
MCV: 98.2 fL — ABNORMAL HIGH (ref 79.3–98.0)
MONO#: 0.7 10*3/uL (ref 0.1–0.9)
MONO%: 6.1 % (ref 0.0–14.0)
NEUT%: 81.5 % — AB (ref 39.0–75.0)
NEUTROS ABS: 9.3 10*3/uL — AB (ref 1.5–6.5)
PLATELETS: 204 10*3/uL (ref 140–400)
RBC: 4.19 10*6/uL — ABNORMAL LOW (ref 4.20–5.82)
RDW: 17.3 % — ABNORMAL HIGH (ref 11.0–14.6)
WBC: 11.4 10*3/uL — AB (ref 4.0–10.3)
lymph#: 1.2 10*3/uL (ref 0.9–3.3)

## 2013-06-23 MED ORDER — DEXAMETHASONE SODIUM PHOSPHATE 10 MG/ML IJ SOLN
INTRAMUSCULAR | Status: AC
  Filled 2013-06-23: qty 1

## 2013-06-23 MED ORDER — ONDANSETRON 8 MG/50ML IVPB (CHCC)
8.0000 mg | Freq: Once | INTRAVENOUS | Status: AC
  Administered 2013-06-23: 8 mg via INTRAVENOUS

## 2013-06-23 MED ORDER — SODIUM CHLORIDE 0.9 % IV SOLN
600.0000 mg/m2 | Freq: Once | INTRAVENOUS | Status: AC
  Administered 2013-06-23: 1178 mg via INTRAVENOUS
  Filled 2013-06-23: qty 30.98

## 2013-06-23 MED ORDER — SODIUM CHLORIDE 0.9 % IJ SOLN
10.0000 mL | INTRAMUSCULAR | Status: DC | PRN
  Administered 2013-06-23: 10 mL
  Filled 2013-06-23: qty 10

## 2013-06-23 MED ORDER — PACLITAXEL PROTEIN-BOUND CHEMO INJECTION 100 MG
80.0000 mg/m2 | Freq: Once | INTRAVENOUS | Status: AC
  Administered 2013-06-23: 150 mg via INTRAVENOUS
  Filled 2013-06-23: qty 30

## 2013-06-23 MED ORDER — ONDANSETRON 8 MG/NS 50 ML IVPB
INTRAVENOUS | Status: AC
  Filled 2013-06-23: qty 8

## 2013-06-23 MED ORDER — DEXAMETHASONE SODIUM PHOSPHATE 10 MG/ML IJ SOLN
10.0000 mg | Freq: Once | INTRAMUSCULAR | Status: AC
  Administered 2013-06-23: 10 mg via INTRAVENOUS

## 2013-06-23 MED ORDER — SODIUM CHLORIDE 0.9 % IV SOLN
Freq: Once | INTRAVENOUS | Status: AC
  Administered 2013-06-23: 12:00:00 via INTRAVENOUS

## 2013-06-23 MED ORDER — HEPARIN SOD (PORK) LOCK FLUSH 100 UNIT/ML IV SOLN
500.0000 [IU] | Freq: Once | INTRAVENOUS | Status: AC | PRN
  Administered 2013-06-23: 500 [IU]
  Filled 2013-06-23: qty 5

## 2013-06-23 NOTE — Patient Instructions (Signed)
[facility] Discharge Instructions for Patients Receiving Chemotherapy  Today you received the following chemotherapy agents Gemzar and Abraxane.  To help prevent nausea and vomiting after your treatment, we encourage you to take your nausea medication.   If you develop nausea and vomiting that is not controlled by your nausea medication, call the clinic.   BELOW ARE SYMPTOMS THAT SHOULD BE REPORTED IMMEDIATELY:  *FEVER GREATER THAN 100.5 F  *CHILLS WITH OR WITHOUT FEVER  NAUSEA AND VOMITING THAT IS NOT CONTROLLED WITH YOUR NAUSEA MEDICATION  *UNUSUAL SHORTNESS OF BREATH  *UNUSUAL BRUISING OR BLEEDING  TENDERNESS IN MOUTH AND THROAT WITH OR WITHOUT PRESENCE OF ULCERS  *URINARY PROBLEMS  *BOWEL PROBLEMS  UNUSUAL RASH Items with * indicate a potential emergency and should be followed up as soon as possible.  Feel free to call the clinic you have any questions or concerns. The clinic phone number is [phone].    

## 2013-06-23 NOTE — Progress Notes (Signed)
[facility] [phone]   [phone]) [phone]  OFFICE PROGRESS NOTE  Florina Ou, MD [place] [address]  DIAGNOSIS:  1) Metastatic pancreatic adenocarcinoma presented with large mass in the care of the pancreas was metastatic liver lesions diagnosed in December of 2014. 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 Ninfa Linden.   CURRENT THERAPY: Systemic chemotherapy with gemcitabine 800 mg/M2 and Abraxane 90 mg/M2 on days, 1, 8, and 15 every 4 weeks. First dose expected on 04/21/2013. Status post 2 cycles   INTERVAL HISTORY: Danny Waters 80 y.o. male returns to the clinic today for followup visit accompanied by his son. The patient is currently recieving systemic chemotherapy with gemcitabine and Abraxane status post 2 cycles. He received day 1 of the first cycle but was unable to receive day 8 and 15 of the first cycle because of thrombocytopenia. He tolerated the second cycle without difficulty except for some thrombocytopenia. He denied having any significant chest pain, shortness of breath, cough or hemoptysis. The patient denied having any nausea or vomiting, no abdominal pain, no diarrhea or constipation. He denied having any back pain. The patient denied having any weight loss or night sweats. He denied having any recent jaundice. Danny Waters the son and patient report that he is no longer driving. He presents to proceed with cycle #3.  MEDICAL HISTORY: Past Medical History  Diagnosis Date  . Hyperlipidemia     takes Lipitor daily  . Hypertension     takes Amlodipine,Metoprolol,and Losartan daily  . Hx of colonic polyps   . Type 2 diabetes mellitus with vascular disease   .  Aortic stenosis     Last ECHO Dr. Wynonia Lawman 09/24/12  AVA 0.76 assymptomatic   . CAD (coronary artery disease) 09/24/2012    Cath 07/04/2002 normal Left main, 50% stenosis proximal LAD, 99% stenosis mid LAD, 50% stenosis mid CFX, 60% stenosis proximal PDA, stent placed mid LAD  Taxus stent to mid LAD 07/04/2002   . Hypertensive heart disease     ALLERGIES:  has No Known Allergies.  MEDICATIONS:  Current Outpatient Prescriptions  Medication Sig Dispense Refill  . acetaminophen (TYLENOL) 500 MG tablet Take 500 mg by mouth every 6 (six) hours as needed for mild pain or moderate pain.      Marland Kitchen amLODipine (NORVASC) 5 MG tablet       . aspirin 81 MG tablet Take 81 mg by mouth daily.       . calcium-vitamin D (OSCAL WITH D) 500-200 MG-UNIT per tablet Take 1 tablet by mouth daily with breakfast.      . FREESTYLE LITE test strip       . insulin glargine (LANTUS) 100 UNIT/ML injection Inject 10 Units into the skin at bedtime.      . Lancets (FREESTYLE) lancets       . lidocaine-prilocaine (EMLA) cream Apply 1 application topically as needed. apply to port 1 hour before chemo appt.  30 g  0  . metFORMIN (GLUCOPHAGE) 500 MG tablet Take 500 mg by mouth 2 (two) times daily with a meal.      . metoprolol succinate (TOPROL-XL) 100 MG 24 hr tablet  Take 100 mg by mouth every morning.       . Multiple Vitamin (MULTIVITAMIN) tablet Take 1 tablet by mouth daily.      . mupirocin ointment (BACTROBAN) 2 % Apply 1 application topically 2 (two) times daily.       . prochlorperazine (COMPAZINE) 10 MG tablet Take 1 tablet (10 mg total) by mouth every 6 (six) hours as needed for nausea or vomiting.  30 tablet  1  . silver sulfADIAZINE (SILVADENE) 1 % cream Apply 1 application topically daily.       No current facility-administered medications for this visit.   Facility-Administered Medications Ordered in Other Visits  Medication Dose Route Frequency Provider Last Rate Last Dose  . Gemcitabine HCl (GEMZAR) 1,178 mg in sodium  chloride 0.9 % 100 mL chemo infusion  600 mg/m2 (Treatment Plan Actual) Intravenous Once Curt Bears, MD      . heparin lock flush 100 unit/mL  500 Units Intracatheter Once PRN Curt Bears, MD      . sodium chloride 0.9 % injection 10 mL  10 mL Intracatheter PRN Curt Bears, MD        SURGICAL HISTORY:  Past Surgical History  Procedure Laterality Date  . [place]  2003  . Hernia repair  1957  . Appendectomy    . Cardiac catheterization  10+yrs ago  . Coronary stent placement  2004    Taxus stent to mid LAD  . Colonoscopy    . Melanoma excision  06/07/2011    Procedure: MELANOMA EXCISION;  Surgeon: Harl Bowie, MD;  Location: [facility];  Service: General;  Laterality: Right;  wide excision melanoma right shoulder  and left back and left upper arm   . Mastoid debridement      REVIEW OF SYSTEMS:  Constitutional: positive for anorexia and fatigue Eyes: negative Ears, nose, mouth, throat, and face: negative Respiratory: negative Cardiovascular: negative Gastrointestinal: negative Genitourinary:negative Integument/breast: negative Hematologic/lymphatic: negative Musculoskeletal:negative Neurological: negative Behavioral/Psych: negative Endocrine: negative Allergic/Immunologic: negative   PHYSICAL EXAMINATION: General appearance: alert, cooperative, fatigued and no distress Head: Normocephalic, without obvious abnormality, atraumatic Neck: no adenopathy, no JVD, supple, symmetrical, trachea midline and thyroid not enlarged, symmetric, no tenderness/mass/nodules Lymph nodes: Cervical, supraclavicular, and axillary nodes normal. Resp: clear to auscultation bilaterally Back: symmetric, no curvature. ROM normal. No CVA tenderness. Cardio: regular rate and rhythm, S1, S2 normal, no murmur, click, rub or gallop GI: soft, non-tender; bowel sounds normal; no masses,  no organomegaly Extremities: extremities normal, atraumatic, no cyanosis or edema Neurologic:  Alert and oriented X 3, normal strength and tone. Normal symmetric reflexes. Normal coordination and gait  ECOG PERFORMANCE STATUS: 1 - Symptomatic but completely ambulatory  There were no vitals taken for this visit.  LABORATORY DATA: Lab Results  Component Value Date   WBC 11.4* 06/23/2013   HGB 13.2 06/23/2013   HCT 41.2 06/23/2013   MCV 98.2* 06/23/2013   PLT 204 06/23/2013      Chemistry      Component Value Date/Time   NA 144 06/23/2013 1125   NA 137 04/10/2013 1210   K 4.1 06/23/2013 1125   K 3.3* 04/10/2013 1210   CL 94* 04/10/2013 1210   CL 94* 06/25/2012 1008   CO2 27 06/23/2013 1125   CO2 29 04/10/2013 1210   BUN 14.4 06/23/2013 1125   BUN 18 04/10/2013 1210   CREATININE 0.9 06/23/2013 1125   CREATININE 1.09 04/10/2013 1210      Component Value Date/Time   CALCIUM  10.3 06/23/2013 1125   CALCIUM 10.9* 04/10/2013 1210   ALKPHOS 681* 06/23/2013 1125   ALKPHOS 373* 03/29/2013 0138   AST 51* 06/23/2013 1125   AST 48* 03/29/2013 0138   ALT 43 06/23/2013 1125   ALT 63* 03/29/2013 0138   BILITOT 2.01* 06/23/2013 1125   BILITOT 0.8 03/29/2013 0138       RADIOGRAPHIC STUDIES:  ASSESSMENT AND PLAN: This is a very pleasant 80 years old white male with history of a stage II malignant melanoma of the left arm status post excision and recently diagnosed with metastatic pancreatic adenocarcinoma with a large mass in the tail of the pancreas and several metastatic liver lesions as well as adrenal and pulmonary metastasis. The patient is currently undergoing systemic chemotherapy with gemcitabine and Abraxane status post 2 cycles. He received day one of cycle #1  and was unable to receive day 8 and 15 of the first cycle secondary to his thrombocytopenia. Starting with cycle #2 forward she is receiving Abraxane 90 mg/M2 and gemcitabine at 800 mg/M2 on days 1, 8 and 15 every 4 weeks. He missed day 8 due to to thrombocytopenia. Patient was discussed with  Dr. Julien Nordmann. He'll proceed with cycle #3 today as  scheduled. We will plan for him to have a restaging CT scan of the abdomen and pelvis with contrast to reevaluate his disease scheduled for his off week. He'll follow with Dr. Julien Nordmann in one month to discuss the results of the restaging CT scan and further treatment options.  The patient was advised to call immediately if he has any concerning symptoms in the interval.  The patient voices understanding of current disease status and treatment options and is in agreement with the current care plan.  All questions were answered. The patient knows to call the clinic with any problems, questions or concerns. We can certainly see the patient much sooner if necessary.  Carlton Adam, PA-C   Disclaimer: This note was dictated with voice recognition software. Similar sounding words can inadvertently be transcribed and may not be corrected upon review.

## 2013-06-24 ENCOUNTER — Telehealth: Payer: Self-pay | Admitting: Internal Medicine

## 2013-06-24 NOTE — Telephone Encounter (Signed)
s.w. pt and advised on March appts.Marland KitchenMarland KitchenMarland KitchenMarland Kitchenpt will pick up sched at nxt visit

## 2013-06-25 NOTE — Patient Instructions (Signed)
Continue lab and chemotherapy as scheduled Followup with Dr. Julien Nordmann in one month with a restaging CT scan of your abdomen and pelvis to reevaluate your disease

## 2013-06-29 ENCOUNTER — Other Ambulatory Visit: Payer: Medicare Other

## 2013-06-29 ENCOUNTER — Other Ambulatory Visit (HOSPITAL_COMMUNITY): Payer: Medicare Other

## 2013-06-30 ENCOUNTER — Encounter: Payer: Self-pay | Admitting: Internal Medicine

## 2013-06-30 ENCOUNTER — Ambulatory Visit (HOSPITAL_BASED_OUTPATIENT_CLINIC_OR_DEPARTMENT_OTHER): Payer: Medicare Other | Admitting: Internal Medicine

## 2013-06-30 ENCOUNTER — Ambulatory Visit (HOSPITAL_BASED_OUTPATIENT_CLINIC_OR_DEPARTMENT_OTHER): Payer: Medicare Other

## 2013-06-30 ENCOUNTER — Other Ambulatory Visit (HOSPITAL_BASED_OUTPATIENT_CLINIC_OR_DEPARTMENT_OTHER): Payer: Medicare Other

## 2013-06-30 VITALS — BP 124/71 | HR 85 | Temp 97.4°F | Resp 18 | Ht 68.0 in | Wt 162.2 lb

## 2013-06-30 DIAGNOSIS — C787 Secondary malignant neoplasm of liver and intrahepatic bile duct: Secondary | ICD-10-CM

## 2013-06-30 DIAGNOSIS — C7970 Secondary malignant neoplasm of unspecified adrenal gland: Secondary | ICD-10-CM

## 2013-06-30 DIAGNOSIS — C4360 Malignant melanoma of unspecified upper limb, including shoulder: Secondary | ICD-10-CM

## 2013-06-30 DIAGNOSIS — C259 Malignant neoplasm of pancreas, unspecified: Secondary | ICD-10-CM

## 2013-06-30 DIAGNOSIS — C252 Malignant neoplasm of tail of pancreas: Secondary | ICD-10-CM

## 2013-06-30 DIAGNOSIS — C7800 Secondary malignant neoplasm of unspecified lung: Secondary | ICD-10-CM

## 2013-06-30 DIAGNOSIS — C439 Malignant melanoma of skin, unspecified: Secondary | ICD-10-CM

## 2013-06-30 DIAGNOSIS — D696 Thrombocytopenia, unspecified: Secondary | ICD-10-CM

## 2013-06-30 DIAGNOSIS — Z5111 Encounter for antineoplastic chemotherapy: Secondary | ICD-10-CM

## 2013-06-30 LAB — CBC WITH DIFFERENTIAL/PLATELET
BASO%: 0.9 % (ref 0.0–2.0)
Basophils Absolute: 0 10*3/uL (ref 0.0–0.1)
EOS ABS: 0.1 10*3/uL (ref 0.0–0.5)
EOS%: 1.8 % (ref 0.0–7.0)
HCT: 37.2 % — ABNORMAL LOW (ref 38.4–49.9)
HEMOGLOBIN: 12.3 g/dL — AB (ref 13.0–17.1)
LYMPH%: 31.3 % (ref 14.0–49.0)
MCH: 32.4 pg (ref 27.2–33.4)
MCHC: 33 g/dL (ref 32.0–36.0)
MCV: 98 fL (ref 79.3–98.0)
MONO#: 0.3 10*3/uL (ref 0.1–0.9)
MONO%: 8.1 % (ref 0.0–14.0)
NEUT%: 57.9 % (ref 39.0–75.0)
NEUTROS ABS: 2.4 10*3/uL (ref 1.5–6.5)
Platelets: 92 10*3/uL — ABNORMAL LOW (ref 140–400)
RBC: 3.79 10*6/uL — ABNORMAL LOW (ref 4.20–5.82)
RDW: 16.5 % — AB (ref 11.0–14.6)
WBC: 4.2 10*3/uL (ref 4.0–10.3)
lymph#: 1.3 10*3/uL (ref 0.9–3.3)

## 2013-06-30 LAB — COMPREHENSIVE METABOLIC PANEL (CC13)
ALBUMIN: 3 g/dL — AB (ref 3.5–5.0)
ALT: 45 U/L (ref 0–55)
ANION GAP: 11 meq/L (ref 3–11)
AST: 45 U/L — ABNORMAL HIGH (ref 5–34)
Alkaline Phosphatase: 617 U/L — ABNORMAL HIGH (ref 40–150)
BUN: 13.3 mg/dL (ref 7.0–26.0)
CALCIUM: 9.8 mg/dL (ref 8.4–10.4)
CHLORIDE: 108 meq/L (ref 98–109)
CO2: 26 meq/L (ref 22–29)
Creatinine: 0.8 mg/dL (ref 0.7–1.3)
GLUCOSE: 115 mg/dL (ref 70–140)
POTASSIUM: 3.7 meq/L (ref 3.5–5.1)
SODIUM: 145 meq/L (ref 136–145)
TOTAL PROTEIN: 6.2 g/dL — AB (ref 6.4–8.3)
Total Bilirubin: 1.64 mg/dL — ABNORMAL HIGH (ref 0.20–1.20)

## 2013-06-30 LAB — LACTATE DEHYDROGENASE (CC13): LDH: 232 U/L (ref 125–245)

## 2013-06-30 MED ORDER — ONDANSETRON 8 MG/50ML IVPB (CHCC)
8.0000 mg | Freq: Once | INTRAVENOUS | Status: AC
  Administered 2013-06-30: 8 mg via INTRAVENOUS

## 2013-06-30 MED ORDER — HEPARIN SOD (PORK) LOCK FLUSH 100 UNIT/ML IV SOLN
500.0000 [IU] | Freq: Once | INTRAVENOUS | Status: AC | PRN
  Administered 2013-06-30: 500 [IU]
  Filled 2013-06-30: qty 5

## 2013-06-30 MED ORDER — DEXAMETHASONE SODIUM PHOSPHATE 10 MG/ML IJ SOLN
INTRAMUSCULAR | Status: AC
  Filled 2013-06-30: qty 1

## 2013-06-30 MED ORDER — DEXAMETHASONE SODIUM PHOSPHATE 10 MG/ML IJ SOLN
10.0000 mg | Freq: Once | INTRAMUSCULAR | Status: AC
  Administered 2013-06-30: 10 mg via INTRAVENOUS

## 2013-06-30 MED ORDER — SODIUM CHLORIDE 0.9 % IV SOLN
Freq: Once | INTRAVENOUS | Status: AC
  Administered 2013-06-30: 12:00:00 via INTRAVENOUS

## 2013-06-30 MED ORDER — ONDANSETRON 8 MG/NS 50 ML IVPB
INTRAVENOUS | Status: AC
  Filled 2013-06-30: qty 8

## 2013-06-30 MED ORDER — GEMCITABINE HCL CHEMO INJECTION 1 GM/26.3ML
600.0000 mg/m2 | Freq: Once | INTRAVENOUS | Status: AC
  Administered 2013-06-30: 1178 mg via INTRAVENOUS
  Filled 2013-06-30: qty 30.98

## 2013-06-30 MED ORDER — PACLITAXEL PROTEIN-BOUND CHEMO INJECTION 100 MG
80.0000 mg/m2 | Freq: Once | INTRAVENOUS | Status: AC
  Administered 2013-06-30: 150 mg via INTRAVENOUS
  Filled 2013-06-30: qty 30

## 2013-06-30 MED ORDER — SODIUM CHLORIDE 0.9 % IJ SOLN
10.0000 mL | INTRAMUSCULAR | Status: DC | PRN
  Administered 2013-06-30: 10 mL
  Filled 2013-06-30: qty 10

## 2013-06-30 NOTE — Progress Notes (Signed)
[facility] Telephone:([phone]   [phone]) [phone]  OFFICE PROGRESS NOTE  Florina Ou, MD [place] [address]  DIAGNOSIS:  1) Metastatic pancreatic adenocarcinoma presented with large mass in the care of the pancreas was metastatic liver lesions diagnosed in December of 2014. 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 Ninfa Linden.   CURRENT THERAPY: Systemic chemotherapy with gemcitabine 800 mg/M2 and Abraxane 90 mg/M2 on days, 1, 8, and 15 every 4 weeks. First dose expected on 04/21/2013.  INTERVAL HISTORY: Danny Waters 80 y.o. male returns to the clinic today for followup visit accompanied by his son. The patient is tolerating his treatment with gemcitabine and Abraxane well except for the fatigue and alopecia. I reduced the dose of gemcitabine to 600 mg/M2 and Abraxane to 80 mg/M2 because of the intolerance and pancytopenia. He denied having any significant fever or chills, no nausea or vomiting. He denied having any significant peripheral neuropathy. He lost few pounds since his last visit. He is here today to start day 8 of cycle #3.  MEDICAL HISTORY: Past Medical History  Diagnosis Date  . Hyperlipidemia     takes Lipitor daily  . Hypertension     takes Amlodipine,Metoprolol,and Losartan daily  . Hx of colonic polyps   . Type 2 diabetes mellitus with vascular disease   . Aortic stenosis     Last ECHO Dr. Wynonia Lawman 09/24/12  AVA 0.76 assymptomatic   . CAD (coronary artery disease) 09/24/2012    Cath 07/04/2002 normal Left main, 50% stenosis proximal LAD, 99% stenosis mid LAD, 50% stenosis mid CFX, 60% stenosis proximal PDA, stent placed mid LAD  Taxus stent to mid LAD 07/04/2002   .  Hypertensive heart disease     ALLERGIES:  has No Known Allergies.  MEDICATIONS:  Current Outpatient Prescriptions  Medication Sig Dispense Refill  . acetaminophen (TYLENOL) 500 MG tablet Take 500 mg by mouth every 6 (six) hours as needed for mild pain or moderate pain.      Marland Kitchen amLODipine (NORVASC) 5 MG tablet       . calcium-vitamin D (OSCAL WITH D) 500-200 MG-UNIT per tablet Take 1 tablet by mouth daily with breakfast.      . FREESTYLE LITE test strip       . insulin glargine (LANTUS) 100 UNIT/ML injection Inject 10 Units into the skin at bedtime.      . Lancets (FREESTYLE) lancets       . lidocaine-prilocaine (EMLA) cream Apply 1 application topically as needed. apply to port 1 hour before chemo appt.  30 g  0  . metFORMIN (GLUCOPHAGE) 500 MG tablet Take 500 mg by mouth 2 (two) times daily with a meal.      . metoprolol succinate (TOPROL-XL) 100 MG 24 hr tablet Take 100 mg by mouth every morning.       . Multiple Vitamin (MULTIVITAMIN) tablet Take 1 tablet by mouth daily.      . mupirocin ointment (BACTROBAN) 2 % Apply 1 application topically 2 (two) times daily.       . prochlorperazine (COMPAZINE) 10 MG tablet Take 1 tablet (10 mg total) by mouth every 6 (six)  hours as needed for nausea or vomiting.  30 tablet  1   No current facility-administered medications for this visit.    SURGICAL HISTORY:  Past Surgical History  Procedure Laterality Date  . [place]  2003  . Hernia repair  1957  . Appendectomy    . Cardiac catheterization  10+yrs ago  . Coronary stent placement  2004    Taxus stent to mid LAD  . Colonoscopy    . Melanoma excision  06/07/2011    Procedure: MELANOMA EXCISION;  Surgeon: Harl Bowie, MD;  Location: [facility];  Service: General;  Laterality: Right;  wide excision melanoma right shoulder  and left back and left upper arm   . Mastoid debridement      REVIEW OF SYSTEMS:  Constitutional: positive for anorexia and fatigue Eyes: negative Ears,  nose, mouth, throat, and face: negative Respiratory: negative Cardiovascular: negative Gastrointestinal: negative Genitourinary:negative Integument/breast: negative Hematologic/lymphatic: negative Musculoskeletal:negative Neurological: negative Behavioral/Psych: negative Endocrine: negative Allergic/Immunologic: negative   PHYSICAL EXAMINATION: General appearance: alert, cooperative, fatigued and no distress Head: Normocephalic, without obvious abnormality, atraumatic Neck: no adenopathy, no JVD, supple, symmetrical, trachea midline and thyroid not enlarged, symmetric, no tenderness/mass/nodules Lymph nodes: Cervical, supraclavicular, and axillary nodes normal. Resp: clear to auscultation bilaterally Back: symmetric, no curvature. ROM normal. No CVA tenderness. Cardio: regular rate and rhythm, S1, S2 normal, no murmur, click, rub or gallop GI: soft, non-tender; bowel sounds normal; no masses,  no organomegaly Extremities: extremities normal, atraumatic, no cyanosis or edema Neurologic: Alert and oriented X 3, normal strength and tone. Normal symmetric reflexes. Normal coordination and gait  ECOG PERFORMANCE STATUS: 1 - Symptomatic but completely ambulatory  Blood pressure 124/71, pulse 85, temperature 97.4 F (36.3 C), temperature source Oral, resp. rate 18, height 5\' 8"  (1.727 m), weight 162 lb 3.2 oz (73.573 kg), SpO2 98.00%.  LABORATORY DATA: Lab Results  Component Value Date   WBC 4.2 06/30/2013   HGB 12.3* 06/30/2013   HCT 37.2* 06/30/2013   MCV 98.0 06/30/2013   PLT 92* 06/30/2013      Chemistry      Component Value Date/Time   NA 145 06/30/2013 0933   NA 137 04/10/2013 1210   K 3.7 06/30/2013 0933   K 3.3* 04/10/2013 1210   CL 94* 04/10/2013 1210   CL 94* 06/25/2012 1008   CO2 26 06/30/2013 0933   CO2 29 04/10/2013 1210   BUN 13.3 06/30/2013 0933   BUN 18 04/10/2013 1210   CREATININE 0.8 06/30/2013 0933   CREATININE 1.09 04/10/2013 1210      Component Value Date/Time    CALCIUM 9.8 06/30/2013 0933   CALCIUM 10.9* 04/10/2013 1210   ALKPHOS 617* 06/30/2013 0933   ALKPHOS 373* 03/29/2013 0138   AST 45* 06/30/2013 0933   AST 48* 03/29/2013 0138   ALT 45 06/30/2013 0933   ALT 63* 03/29/2013 0138   BILITOT 1.64* 06/30/2013 0933   BILITOT 0.8 03/29/2013 0138       RADIOGRAPHIC STUDIES:  ASSESSMENT AND PLAN: This is a very pleasant 80 years old white male with history of a stage II malignant melanoma of the left arm status post excision and recently diagnosed with metastatic pancreatic adenocarcinoma with a large mass in the tail of the pancreas and several metastatic liver lesions as well as adrenal and pulmonary metastasis. The patient is currently undergoing systemic chemotherapy with gemcitabine and Abraxane status post 2 cycles dose and he is currently undergoing cycle #3. He has mild thrombocytopenia today  but I think we can proceed with his chemotherapy as scheduled. He would come back for followup visit in 2 weeks for reevaluation after repeating CT scan of the abdomen and pelvis for restaging of his disease. The patient was advised to call immediately if he has any concerning symptoms in the interval.  The patient voices understanding of current disease status and treatment options and is in agreement with the current care plan.  All questions were answered. The patient knows to call the clinic with any problems, questions or concerns. We can certainly see the patient much sooner if necessary.  Disclaimer: This note was dictated with voice recognition software. Similar sounding words can inadvertently be transcribed and may not be corrected upon review.

## 2013-06-30 NOTE — Progress Notes (Signed)
Per Dr. Julien Nordmann, treat despite PLT 92 today. Thrombocytopenic precautions reviewed with pt and son.

## 2013-06-30 NOTE — Patient Instructions (Signed)
[facility] Discharge Instructions for Patients Receiving Chemotherapy  Today you received the following chemotherapy agents: Abraxane and Gemzar. To help prevent nausea and vomiting after your treatment, we encourage you to take your nausea medication, Compazine. Take one every six hours as needed.   If you develop nausea and vomiting that is not controlled by your nausea medication, call the clinic.   BELOW ARE SYMPTOMS THAT SHOULD BE REPORTED IMMEDIATELY:  *FEVER GREATER THAN 100.5 F  *CHILLS WITH OR WITHOUT FEVER  NAUSEA AND VOMITING THAT IS NOT CONTROLLED WITH YOUR NAUSEA MEDICATION  *UNUSUAL SHORTNESS OF BREATH  *UNUSUAL BRUISING OR BLEEDING  TENDERNESS IN MOUTH AND THROAT WITH OR WITHOUT PRESENCE OF ULCERS  *URINARY PROBLEMS  *BOWEL PROBLEMS  UNUSUAL RASH Items with * indicate a potential emergency and should be followed up as soon as possible.  Feel free to call the clinic should you have any questions or concerns. The clinic phone number is [phone].

## 2013-07-07 ENCOUNTER — Other Ambulatory Visit: Payer: Self-pay | Admitting: Physician Assistant

## 2013-07-07 ENCOUNTER — Ambulatory Visit (HOSPITAL_BASED_OUTPATIENT_CLINIC_OR_DEPARTMENT_OTHER): Payer: Medicare Other

## 2013-07-07 ENCOUNTER — Other Ambulatory Visit (HOSPITAL_BASED_OUTPATIENT_CLINIC_OR_DEPARTMENT_OTHER): Payer: Medicare Other

## 2013-07-07 VITALS — BP 110/59 | HR 54 | Temp 97.5°F | Resp 16

## 2013-07-07 DIAGNOSIS — C787 Secondary malignant neoplasm of liver and intrahepatic bile duct: Secondary | ICD-10-CM

## 2013-07-07 DIAGNOSIS — C252 Malignant neoplasm of tail of pancreas: Secondary | ICD-10-CM

## 2013-07-07 DIAGNOSIS — C7970 Secondary malignant neoplasm of unspecified adrenal gland: Secondary | ICD-10-CM

## 2013-07-07 DIAGNOSIS — Z5111 Encounter for antineoplastic chemotherapy: Secondary | ICD-10-CM

## 2013-07-07 DIAGNOSIS — C259 Malignant neoplasm of pancreas, unspecified: Secondary | ICD-10-CM

## 2013-07-07 DIAGNOSIS — C7800 Secondary malignant neoplasm of unspecified lung: Secondary | ICD-10-CM

## 2013-07-07 LAB — CBC WITH DIFFERENTIAL/PLATELET
BASO%: 0.4 % (ref 0.0–2.0)
Basophils Absolute: 0 10*3/uL (ref 0.0–0.1)
EOS%: 2.2 % (ref 0.0–7.0)
Eosinophils Absolute: 0.1 10*3/uL (ref 0.0–0.5)
HCT: 33.9 % — ABNORMAL LOW (ref 38.4–49.9)
HGB: 11.1 g/dL — ABNORMAL LOW (ref 13.0–17.1)
LYMPH%: 29.1 % (ref 14.0–49.0)
MCH: 32.1 pg (ref 27.2–33.4)
MCHC: 32.7 g/dL (ref 32.0–36.0)
MCV: 98 fL (ref 79.3–98.0)
MONO#: 0.6 10*3/uL (ref 0.1–0.9)
MONO%: 10.9 % (ref 0.0–14.0)
NEUT#: 3.1 10*3/uL (ref 1.5–6.5)
NEUT%: 57.4 % (ref 39.0–75.0)
PLATELETS: 90 10*3/uL — AB (ref 140–400)
RBC: 3.46 10*6/uL — AB (ref 4.20–5.82)
RDW: 16.6 % — ABNORMAL HIGH (ref 11.0–14.6)
WBC: 5.4 10*3/uL (ref 4.0–10.3)
lymph#: 1.6 10*3/uL (ref 0.9–3.3)
nRBC: 1 % — ABNORMAL HIGH (ref 0–0)

## 2013-07-07 LAB — COMPREHENSIVE METABOLIC PANEL (CC13)
ALT: 41 U/L (ref 0–55)
AST: 34 U/L (ref 5–34)
Albumin: 2.8 g/dL — ABNORMAL LOW (ref 3.5–5.0)
Alkaline Phosphatase: 578 U/L — ABNORMAL HIGH (ref 40–150)
Anion Gap: 12 mEq/L — ABNORMAL HIGH (ref 3–11)
BILIRUBIN TOTAL: 1.31 mg/dL — AB (ref 0.20–1.20)
BUN: 11.9 mg/dL (ref 7.0–26.0)
CALCIUM: 9.3 mg/dL (ref 8.4–10.4)
CHLORIDE: 108 meq/L (ref 98–109)
CO2: 25 mEq/L (ref 22–29)
CREATININE: 0.7 mg/dL (ref 0.7–1.3)
Glucose: 102 mg/dl (ref 70–140)
Potassium: 3.4 mEq/L — ABNORMAL LOW (ref 3.5–5.1)
Sodium: 145 mEq/L (ref 136–145)
Total Protein: 5.7 g/dL — ABNORMAL LOW (ref 6.4–8.3)

## 2013-07-07 MED ORDER — PACLITAXEL PROTEIN-BOUND CHEMO INJECTION 100 MG
80.0000 mg/m2 | Freq: Once | INTRAVENOUS | Status: AC
  Administered 2013-07-07: 150 mg via INTRAVENOUS
  Filled 2013-07-07: qty 30

## 2013-07-07 MED ORDER — ONDANSETRON 8 MG/NS 50 ML IVPB
INTRAVENOUS | Status: AC
  Filled 2013-07-07: qty 8

## 2013-07-07 MED ORDER — DEXAMETHASONE SODIUM PHOSPHATE 10 MG/ML IJ SOLN
10.0000 mg | Freq: Once | INTRAMUSCULAR | Status: AC
  Administered 2013-07-07: 10 mg via INTRAVENOUS

## 2013-07-07 MED ORDER — DEXAMETHASONE SODIUM PHOSPHATE 10 MG/ML IJ SOLN
INTRAMUSCULAR | Status: AC
  Filled 2013-07-07: qty 1

## 2013-07-07 MED ORDER — SODIUM CHLORIDE 0.9 % IJ SOLN
10.0000 mL | INTRAMUSCULAR | Status: DC | PRN
  Administered 2013-07-07: 10 mL
  Filled 2013-07-07: qty 10

## 2013-07-07 MED ORDER — ONDANSETRON 8 MG/50ML IVPB (CHCC)
8.0000 mg | Freq: Once | INTRAVENOUS | Status: AC
  Administered 2013-07-07: 8 mg via INTRAVENOUS

## 2013-07-07 MED ORDER — SODIUM CHLORIDE 0.9 % IV SOLN
Freq: Once | INTRAVENOUS | Status: AC
  Administered 2013-07-07: 13:00:00 via INTRAVENOUS

## 2013-07-07 MED ORDER — SODIUM CHLORIDE 0.9 % IV SOLN
600.0000 mg/m2 | Freq: Once | INTRAVENOUS | Status: AC
  Administered 2013-07-07: 1178 mg via INTRAVENOUS
  Filled 2013-07-07: qty 30.98

## 2013-07-07 MED ORDER — HEPARIN SOD (PORK) LOCK FLUSH 100 UNIT/ML IV SOLN
500.0000 [IU] | Freq: Once | INTRAVENOUS | Status: AC | PRN
  Administered 2013-07-07: 500 [IU]
  Filled 2013-07-07: qty 5

## 2013-07-07 NOTE — Progress Notes (Signed)
OK to treat with PLT 90k, per Dr. Benay Spice. Dr. Julien Nordmann is out of the office today.

## 2013-07-10 MED ORDER — ONDANSETRON HCL 8 MG PO TABS
ORAL_TABLET | ORAL | Status: AC
  Filled 2013-07-10: qty 1

## 2013-07-14 ENCOUNTER — Ambulatory Visit (HOSPITAL_COMMUNITY)
Admission: RE | Admit: 2013-07-14 | Discharge: 2013-07-14 | Disposition: A | Discharge status: Home / Self Care | Payer: Medicare Other | Source: Ambulatory Visit | Attending: Physician Assistant | Admitting: Physician Assistant

## 2013-07-14 ENCOUNTER — Other Ambulatory Visit: Payer: Self-pay | Admitting: Physician Assistant

## 2013-07-14 ENCOUNTER — Other Ambulatory Visit (HOSPITAL_BASED_OUTPATIENT_CLINIC_OR_DEPARTMENT_OTHER): Payer: Medicare Other

## 2013-07-14 DIAGNOSIS — I714 Abdominal aortic aneurysm, without rupture, unspecified: Secondary | ICD-10-CM | POA: Insufficient documentation

## 2013-07-14 DIAGNOSIS — C259 Malignant neoplasm of pancreas, unspecified: Secondary | ICD-10-CM | POA: Insufficient documentation

## 2013-07-14 DIAGNOSIS — C787 Secondary malignant neoplasm of liver and intrahepatic bile duct: Principal | ICD-10-CM

## 2013-07-14 DIAGNOSIS — K559 Vascular disorder of intestine, unspecified: Secondary | ICD-10-CM | POA: Insufficient documentation

## 2013-07-14 DIAGNOSIS — C252 Malignant neoplasm of tail of pancreas: Secondary | ICD-10-CM

## 2013-07-14 DIAGNOSIS — D696 Thrombocytopenia, unspecified: Secondary | ICD-10-CM

## 2013-07-14 DIAGNOSIS — N281 Cyst of kidney, acquired: Secondary | ICD-10-CM | POA: Insufficient documentation

## 2013-07-14 DIAGNOSIS — N289 Disorder of kidney and ureter, unspecified: Secondary | ICD-10-CM | POA: Insufficient documentation

## 2013-07-14 DIAGNOSIS — C439 Malignant melanoma of skin, unspecified: Secondary | ICD-10-CM | POA: Insufficient documentation

## 2013-07-14 DIAGNOSIS — E279 Disorder of adrenal gland, unspecified: Secondary | ICD-10-CM | POA: Insufficient documentation

## 2013-07-14 LAB — CBC WITH DIFFERENTIAL/PLATELET
BASO%: 0.4 % (ref 0.0–2.0)
BASOS ABS: 0 10*3/uL (ref 0.0–0.1)
EOS%: 1.1 % (ref 0.0–7.0)
Eosinophils Absolute: 0.1 10*3/uL (ref 0.0–0.5)
HCT: 33.7 % — ABNORMAL LOW (ref 38.4–49.9)
HEMOGLOBIN: 11.1 g/dL — AB (ref 13.0–17.1)
LYMPH#: 1.3 10*3/uL (ref 0.9–3.3)
LYMPH%: 21.3 % (ref 14.0–49.0)
MCH: 32.6 pg (ref 27.2–33.4)
MCHC: 33 g/dL (ref 32.0–36.0)
MCV: 98.9 fL — ABNORMAL HIGH (ref 79.3–98.0)
MONO#: 0.5 10*3/uL (ref 0.1–0.9)
MONO%: 8.2 % (ref 0.0–14.0)
NEUT#: 4.2 10*3/uL (ref 1.5–6.5)
NEUT%: 69 % (ref 39.0–75.0)
Platelets: 135 10*3/uL — ABNORMAL LOW (ref 140–400)
RBC: 3.4 10*6/uL — ABNORMAL LOW (ref 4.20–5.82)
RDW: 16.5 % — AB (ref 11.0–14.6)
WBC: 6.1 10*3/uL (ref 4.0–10.3)

## 2013-07-14 LAB — COMPREHENSIVE METABOLIC PANEL (CC13)
ALBUMIN: 2.8 g/dL — AB (ref 3.5–5.0)
ALT: 23 U/L (ref 0–55)
AST: 26 U/L (ref 5–34)
Alkaline Phosphatase: 500 U/L — ABNORMAL HIGH (ref 40–150)
Anion Gap: 11 mEq/L (ref 3–11)
BUN: 9.4 mg/dL (ref 7.0–26.0)
CALCIUM: 9.3 mg/dL (ref 8.4–10.4)
CHLORIDE: 103 meq/L (ref 98–109)
CO2: 25 mEq/L (ref 22–29)
Creatinine: 0.7 mg/dL (ref 0.7–1.3)
Glucose: 104 mg/dl (ref 70–140)
POTASSIUM: 3.2 meq/L — AB (ref 3.5–5.1)
Sodium: 139 mEq/L (ref 136–145)
TOTAL PROTEIN: 5.8 g/dL — AB (ref 6.4–8.3)
Total Bilirubin: 1.34 mg/dL — ABNORMAL HIGH (ref 0.20–1.20)

## 2013-07-14 MED ORDER — IOHEXOL 300 MG/ML  SOLN
50.0000 mL | Freq: Once | INTRAMUSCULAR | Status: AC | PRN
  Administered 2013-07-14: 50 mL via ORAL

## 2013-07-14 MED ORDER — IOHEXOL 300 MG/ML  SOLN
100.0000 mL | Freq: Once | INTRAMUSCULAR | Status: AC | PRN
  Administered 2013-07-14: 100 mL via INTRAVENOUS

## 2013-07-16 ENCOUNTER — Emergency Department (HOSPITAL_COMMUNITY): Payer: Medicare Other

## 2013-07-16 ENCOUNTER — Inpatient Hospital Stay (HOSPITAL_COMMUNITY)
Admission: EM | Admit: 2013-07-16 | Discharge: 2013-07-20 | DRG: 175 | Disposition: A | Discharge status: Hospice | Payer: Medicare Other | Attending: Internal Medicine | Admitting: Internal Medicine

## 2013-07-16 ENCOUNTER — Encounter (HOSPITAL_COMMUNITY): Payer: Self-pay | Admitting: Emergency Medicine

## 2013-07-16 ENCOUNTER — Inpatient Hospital Stay (HOSPITAL_COMMUNITY): Payer: Medicare Other

## 2013-07-16 DIAGNOSIS — Z794 Long term (current) use of insulin: Secondary | ICD-10-CM

## 2013-07-16 DIAGNOSIS — R945 Abnormal results of liver function studies: Secondary | ICD-10-CM

## 2013-07-16 DIAGNOSIS — R739 Hyperglycemia, unspecified: Secondary | ICD-10-CM

## 2013-07-16 DIAGNOSIS — C259 Malignant neoplasm of pancreas, unspecified: Secondary | ICD-10-CM | POA: Diagnosis present

## 2013-07-16 DIAGNOSIS — I119 Hypertensive heart disease without heart failure: Secondary | ICD-10-CM

## 2013-07-16 DIAGNOSIS — Z9861 Coronary angioplasty status: Secondary | ICD-10-CM

## 2013-07-16 DIAGNOSIS — I999 Unspecified disorder of circulatory system: Secondary | ICD-10-CM | POA: Diagnosis present

## 2013-07-16 DIAGNOSIS — Z66 Do not resuscitate: Secondary | ICD-10-CM | POA: Diagnosis present

## 2013-07-16 DIAGNOSIS — C439 Malignant melanoma of skin, unspecified: Secondary | ICD-10-CM | POA: Diagnosis present

## 2013-07-16 DIAGNOSIS — I2699 Other pulmonary embolism without acute cor pulmonale: Secondary | ICD-10-CM

## 2013-07-16 DIAGNOSIS — R748 Abnormal levels of other serum enzymes: Secondary | ICD-10-CM | POA: Diagnosis present

## 2013-07-16 DIAGNOSIS — M7989 Other specified soft tissue disorders: Secondary | ICD-10-CM

## 2013-07-16 DIAGNOSIS — I498 Other specified cardiac arrhythmias: Secondary | ICD-10-CM | POA: Diagnosis present

## 2013-07-16 DIAGNOSIS — E1169 Type 2 diabetes mellitus with other specified complication: Secondary | ICD-10-CM | POA: Diagnosis present

## 2013-07-16 DIAGNOSIS — Z9181 History of falling: Secondary | ICD-10-CM

## 2013-07-16 DIAGNOSIS — Z860100 Personal history of colon polyps, unspecified: Secondary | ICD-10-CM

## 2013-07-16 DIAGNOSIS — Z79899 Other long term (current) drug therapy: Secondary | ICD-10-CM

## 2013-07-16 DIAGNOSIS — I824Y9 Acute embolism and thrombosis of unspecified deep veins of unspecified proximal lower extremity: Secondary | ICD-10-CM | POA: Diagnosis present

## 2013-07-16 DIAGNOSIS — E876 Hypokalemia: Secondary | ICD-10-CM | POA: Diagnosis present

## 2013-07-16 DIAGNOSIS — I359 Nonrheumatic aortic valve disorder, unspecified: Secondary | ICD-10-CM

## 2013-07-16 DIAGNOSIS — J9600 Acute respiratory failure, unspecified whether with hypoxia or hypercapnia: Secondary | ICD-10-CM | POA: Diagnosis present

## 2013-07-16 DIAGNOSIS — I350 Nonrheumatic aortic (valve) stenosis: Secondary | ICD-10-CM

## 2013-07-16 DIAGNOSIS — Z8249 Family history of ischemic heart disease and other diseases of the circulatory system: Secondary | ICD-10-CM

## 2013-07-16 DIAGNOSIS — E43 Unspecified severe protein-calorie malnutrition: Secondary | ICD-10-CM | POA: Insufficient documentation

## 2013-07-16 DIAGNOSIS — E1159 Type 2 diabetes mellitus with other circulatory complications: Secondary | ICD-10-CM | POA: Diagnosis present

## 2013-07-16 DIAGNOSIS — G9341 Metabolic encephalopathy: Secondary | ICD-10-CM

## 2013-07-16 DIAGNOSIS — Z9221 Personal history of antineoplastic chemotherapy: Secondary | ICD-10-CM

## 2013-07-16 DIAGNOSIS — I824Z9 Acute embolism and thrombosis of unspecified deep veins of unspecified distal lower extremity: Secondary | ICD-10-CM | POA: Diagnosis present

## 2013-07-16 DIAGNOSIS — I82409 Acute embolism and thrombosis of unspecified deep veins of unspecified lower extremity: Secondary | ICD-10-CM | POA: Diagnosis present

## 2013-07-16 DIAGNOSIS — C787 Secondary malignant neoplasm of liver and intrahepatic bile duct: Secondary | ICD-10-CM | POA: Diagnosis present

## 2013-07-16 DIAGNOSIS — E785 Hyperlipidemia, unspecified: Secondary | ICD-10-CM

## 2013-07-16 DIAGNOSIS — I10 Essential (primary) hypertension: Secondary | ICD-10-CM

## 2013-07-16 DIAGNOSIS — R7989 Other specified abnormal findings of blood chemistry: Secondary | ICD-10-CM

## 2013-07-16 DIAGNOSIS — I2510 Atherosclerotic heart disease of native coronary artery without angina pectoris: Secondary | ICD-10-CM | POA: Diagnosis present

## 2013-07-16 HISTORY — DX: Malignant neoplasm of pancreas, unspecified: C25.9

## 2013-07-16 HISTORY — DX: Malignant melanoma of skin, unspecified: C43.9

## 2013-07-16 LAB — COMPREHENSIVE METABOLIC PANEL
ALBUMIN: 2.6 g/dL — AB (ref 3.5–5.2)
ALT: 20 U/L (ref 0–53)
AST: 22 U/L (ref 0–37)
Alkaline Phosphatase: 437 U/L — ABNORMAL HIGH (ref 39–117)
BUN: 11 mg/dL (ref 6–23)
CALCIUM: 9.4 mg/dL (ref 8.4–10.5)
CO2: 27 mEq/L (ref 19–32)
Chloride: 106 mEq/L (ref 96–112)
Creatinine, Ser: 0.92 mg/dL (ref 0.50–1.35)
GFR calc Af Amer: 89 mL/min — ABNORMAL LOW (ref >90)
GFR calc non Af Amer: 77 mL/min — ABNORMAL LOW (ref >90)
Glucose, Bld: 126 mg/dL — ABNORMAL HIGH (ref 70–99)
Potassium: 3.6 mEq/L — ABNORMAL LOW (ref 3.7–5.3)
SODIUM: 144 meq/L (ref 137–147)
TOTAL PROTEIN: 5.5 g/dL — AB (ref 6.0–8.3)
Total Bilirubin: 1.1 mg/dL (ref 0.3–1.2)

## 2013-07-16 LAB — CBC
HCT: 32.3 % — ABNORMAL LOW (ref 39.0–52.0)
Hemoglobin: 10.6 g/dL — ABNORMAL LOW (ref 13.0–17.0)
MCH: 32.6 pg (ref 26.0–34.0)
MCHC: 32.8 g/dL (ref 30.0–36.0)
MCV: 99.4 fL (ref 78.0–100.0)
PLATELETS: 161 10*3/uL (ref 150–400)
RBC: 3.25 MIL/uL — AB (ref 4.22–5.81)
RDW: 17 % — AB (ref 11.5–15.5)
WBC: 7.2 10*3/uL (ref 4.0–10.5)

## 2013-07-16 LAB — PROTIME-INR
INR: 1.17 (ref 0.00–1.49)
Prothrombin Time: 14.7 seconds (ref 11.6–15.2)

## 2013-07-16 LAB — APTT: APTT: 29 s (ref 24–37)

## 2013-07-16 LAB — GLUCOSE, CAPILLARY: Glucose-Capillary: 92 mg/dL (ref 70–99)

## 2013-07-16 LAB — HEPARIN LEVEL (UNFRACTIONATED): Heparin Unfractionated: 0.47 IU/mL (ref 0.30–0.70)

## 2013-07-16 LAB — HEMOGLOBIN A1C
HEMOGLOBIN A1C: 6.2 % — AB (ref <5.7)
MEAN PLASMA GLUCOSE: 131 mg/dL — AB (ref <117)

## 2013-07-16 LAB — PRO B NATRIURETIC PEPTIDE: Pro B Natriuretic peptide (BNP): 496.5 pg/mL — ABNORMAL HIGH (ref 0–450)

## 2013-07-16 MED ORDER — SODIUM CHLORIDE 0.9 % IV SOLN
INTRAVENOUS | Status: DC
  Administered 2013-07-16 – 2013-07-18 (×3): via INTRAVENOUS

## 2013-07-16 MED ORDER — IOHEXOL 350 MG/ML SOLN
100.0000 mL | Freq: Once | INTRAVENOUS | Status: AC | PRN
  Administered 2013-07-16: 100 mL via INTRAVENOUS

## 2013-07-16 MED ORDER — METOPROLOL SUCCINATE ER 50 MG PO TB24
50.0000 mg | ORAL_TABLET | Freq: Every morning | ORAL | Status: DC
  Administered 2013-07-17 – 2013-07-18 (×2): 50 mg via ORAL
  Filled 2013-07-16 (×2): qty 1

## 2013-07-16 MED ORDER — ACETAMINOPHEN 325 MG PO TABS: 650.0000 mg | ORAL_TABLET | Freq: Four times a day (QID) | ORAL | Status: DC | PRN

## 2013-07-16 MED ORDER — POTASSIUM CHLORIDE CRYS ER 20 MEQ PO TBCR
40.0000 meq | EXTENDED_RELEASE_TABLET | Freq: Once | ORAL | Status: AC
  Administered 2013-07-16: 40 meq via ORAL
  Filled 2013-07-16: qty 2

## 2013-07-16 MED ORDER — KCL IN DEXTROSE-NACL 20-5-0.45 MEQ/L-%-% IV SOLN
INTRAVENOUS | Status: DC
  Administered 2013-07-16: 18:00:00 via INTRAVENOUS
  Filled 2013-07-16: qty 1000

## 2013-07-16 MED ORDER — SODIUM CHLORIDE 0.9 % IJ SOLN: 3.0000 mL | Freq: Two times a day (BID) | INTRAMUSCULAR | Status: DC

## 2013-07-16 MED ORDER — ENSURE PUDDING PO PUDG
1.0000 | Freq: Three times a day (TID) | ORAL | Status: DC
  Administered 2013-07-16 – 2013-07-17 (×2): 1 via ORAL
  Filled 2013-07-16 (×4): qty 1

## 2013-07-16 MED ORDER — HEPARIN BOLUS VIA INFUSION
2000.0000 [IU] | Freq: Once | INTRAVENOUS | Status: AC
  Administered 2013-07-16: 2000 [IU] via INTRAVENOUS
  Filled 2013-07-16: qty 2000

## 2013-07-16 MED ORDER — INSULIN ASPART 100 UNIT/ML ~~LOC~~ SOLN
0.0000 [IU] | Freq: Three times a day (TID) | SUBCUTANEOUS | Status: DC
  Administered 2013-07-17: 2 [IU] via SUBCUTANEOUS
  Administered 2013-07-17: 1 [IU] via SUBCUTANEOUS

## 2013-07-16 MED ORDER — HEPARIN (PORCINE) IN NACL 100-0.45 UNIT/ML-% IJ SOLN
1100.0000 [IU]/h | INTRAMUSCULAR | Status: AC
  Administered 2013-07-16: 1100 [IU]/h via INTRAVENOUS
  Filled 2013-07-16 (×3): qty 250

## 2013-07-16 MED ORDER — ACETAMINOPHEN 650 MG RE SUPP: 650.0000 mg | Freq: Four times a day (QID) | RECTAL | Status: DC | PRN

## 2013-07-16 NOTE — H&P (Addendum)
[facility] History and Physical  AUTREY FORTINO [id number] DOB: Aug 09, 1929 DOA: 07/16/2013  Referring physician:  Sherwood Gambler PCP:  Florina Ou, MD   Chief Complaint:  Lower extremity swelling and dyspnea  HPI:  The patient is a 80 y.o. year-old male with history of metastatic pancreatic cancer, malignant melanoma, CAD s/p stents, HTN/HLD who presents with lower extremity swelling.  The patient has been receiving palliative chemotherapy from Dr. Julien Nordmann for his metastatic pancreatic cancer.  He noticed some swelling of his right leg a couple of months ago.  2 weeks ago, he developed increasing swelling, redness and warmth of his right leg.  For the last week or so he has been somewhat Allyson Tineo of breath, progressively worse over the last 2 days. He denies cough, fevers, wheezing, chest pain, palpitations.   His shortness of breath is worse with exertion but he does not notice any change with position.   His son describes his breathing as if the patient had recently run a marathon.  Because his lower extremity swelling had increased, the family called the oncology clinic who referred the patient to the emergency department.  In the emergency department, his vital signs were stable, he had oxygen saturations of 87% on room air.  He was not tachycardic and his blood pressure was within normal limits. His labs were notable for hemoglobin of 10.6, baseline. Platelets 161. Duplex of the lower extremities demonstrated a large clot burden in the right leg with some mobile thrombus in the right common femoral vein.  A CT angiogram the chest demonstrated right pulmonary embolism.  Pro-BNP mildly elevated and suggestion of right heart strain on CT.  He was started on heparin infusion.  I have ordered CT head to rule out brain mets given hx of malignant melanoma.    Review of Systems:  General:  Denies fevers, chills. 40-lb weight loss.   HEENT:  Denies changes to hearing and vision, rhinorrhea, sinus  congestion, sore throat CV:  Denies chest pain and palpitations PULM:  + SOB.  Denies wheezing, cough.   GI:  Denies nausea, vomiting, constipation, diarrhea.   GU:  Denies dysuria, frequency, urgency ENDO:  Denies polyuria, polydipsia.   HEME:  Denies hematemesis, blood in stools, melena, abnormal bruising or bleeding.  LYMPH:  Denies lymphadenopathy.   MSK:  Denies arthralgias, myalgias.   DERM:  Denies skin rash or ulcer.   NEURO:  Denies focal numbness, weakness, slurred speech, confusion, facial droop.  Poor memory per family.   PSYCH:  Denies anxiety and depression.    Past Medical History  Diagnosis Date  . Hyperlipidemia     takes Lipitor daily  . Hypertension     takes Amlodipine,Metoprolol,and Losartan daily  . Hx of colonic polyps   . Type 2 diabetes mellitus with vascular disease   . Aortic stenosis     Last ECHO Dr. Wynonia Lawman 09/24/12  AVA 0.76 assymptomatic   . CAD (coronary artery disease) 09/24/2012    Cath 07/04/2002 normal Left main, 50% stenosis proximal LAD, 99% stenosis mid LAD, 50% stenosis mid CFX, 60% stenosis proximal PDA, stent placed mid LAD  Taxus stent to mid LAD 07/04/2002   . Hypertensive heart disease    Past Surgical History  Procedure Laterality Date  . [place]  2003  . Hernia repair  1957  . Appendectomy    . Cardiac catheterization  10+yrs ago  . Coronary stent placement  2004    Taxus stent to mid LAD  .  Colonoscopy    . Melanoma excision  06/07/2011    Procedure: MELANOMA EXCISION;  Surgeon: Harl Bowie, MD;  Location: [place];  Service: General;  Laterality: Right;  wide excision melanoma right shoulder  and left back and left upper arm   . Mastoid debridement     Social History:  reports that he quit smoking about 55 years ago. He has never used smokeless tobacco. He reports that he does not drink alcohol or use illicit drugs. Lives alone.  Does not use cane or walker, but does have them at home.  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  No falls, but weak on legs.  Does not remember to take his medications.     No Known Allergies  Family History  Problem Relation Age of Onset  . Heart disease Mother   . Hypertension Mother   . Anesthesia problems Neg Hx   . Hypotension Neg Hx   . Malignant hyperthermia Neg Hx   . Pseudochol deficiency Neg Hx   . Hypertension Father      Prior to Admission medications   Medication Sig Start Date End Date Taking? Authorizing Provider  amLODipine (NORVASC) 5 MG tablet Take 5 mg by mouth daily.  06/08/13  Yes Historical Provider, MD  insulin glargine (LANTUS) 100 UNIT/ML injection Inject 10 Units into the skin at bedtime. 03/30/13  Yes Kelvin Cellar, MD  lidocaine-prilocaine (EMLA) cream Apply 1 application topically as needed. apply to port 1 hour before chemo appt. 04/13/13  Yes Curt Bears, MD  metFORMIN (GLUCOPHAGE) 500 MG tablet Take 500 mg by mouth 2 (two) times daily with a meal.   Yes Historical Provider, MD  metoprolol succinate (TOPROL-XL) 100 MG 24 hr tablet Take 100 mg by mouth every morning.  05/17/11  Yes Historical Provider, MD  PRESCRIPTION MEDICATION Chemo-Dr Mohhamed   Yes Historical Provider, MD   Physical Exam: Filed Vitals:   07/16/13 1226 07/16/13 1501  BP: 123/96   Pulse: 70   Temp: 98 F (36.7 C)   TempSrc: Oral   Resp: 16   Height:  5\' 8"  (1.727 m)  Weight: 73.483 kg (162 lb)   SpO2: 99%      General:  Cachectic CM, NAD  Eyes:  PERRL, anicteric, non-injected.  ENT:  Nares clear.  OP clear, non-erythematous without plaques or exudates.  MMM.  Neck:  Supple without TM or JVD.    Lymph:  No cervical, supraclavicular, or submandibular LAD.  Cardiovascular:  RRR, normal S1, S2, without m/r/g.  2+ pulses, warm extremities  Respiratory:  CTA bilaterally without increased WOB.  Abdomen:  NABS.  Soft, ND/NT.    Skin:  No rashes or focal lesions.  Musculoskeletal:  Normal bulk and tone.  2+ RLE and 1+ LLE  edema.  Right shin/calf pink and warm to touch.    Psychiatric:  A & O x 4, but clearly forgot large parts of our conversation before I even left the room.    Neurologic:  CN 3-12 intact.  4/5 strength.  Sensation intact.  Labs on Admission:  Basic Metabolic Panel:  Recent Labs Lab 07/14/13 1307 07/16/13 1338  NA 139 144  K 3.2* 3.6*  CL  --  106  CO2 25 27  GLUCOSE 104 126*  BUN 9.4 11  CREATININE 0.7 0.92  CALCIUM 9.3 9.4   Liver Function Tests:  Recent Labs Lab 07/14/13 1307 07/16/13 1338  AST 26 22  ALT 23 20  ALKPHOS 500* 437*  BILITOT 1.34* 1.1  PROT 5.8* 5.5*  ALBUMIN 2.8* 2.6*   No results found for this basename: LIPASE, AMYLASE,  in the last 168 hours No results found for this basename: AMMONIA,  in the last 168 hours CBC:  Recent Labs Lab 07/14/13 1307 07/16/13 1338  WBC 6.1 7.2  NEUTROABS 4.2  --   HGB 11.1* 10.6*  HCT 33.7* 32.3*  MCV 98.9* 99.4  PLT 135* 161   Cardiac Enzymes: No results found for this basename: CKTOTAL, CKMB, CKMBINDEX, TROPONINI,  in the last 168 hours  BNP (last 3 results) No results found for this basename: PROBNP,  in the last 8760 hours CBG: No results found for this basename: GLUCAP,  in the last 168 hours  Radiological Exams on Admission: Dg Chest 2 View  07/16/2013   CLINICAL DATA:  Bilateral lower leg swelling.  EXAM: CHEST  2 VIEW  COMPARISON:  03/28/2013.  Chest CT dated 03/30/2013.  FINDINGS: Interval mildly enlarged cardiac silhouette and right jugular porta catheter. Clear lungs. Thoracic spine degenerative changes and mild scoliosis.  IMPRESSION: No acute abnormality.  Interval mild cardiomegaly.   Electronically Signed   By: Enrique Sack M.D.   On: 07/16/2013 14:33    EKG:  pending  Assessment/Plan Active Problems:   DVT (deep venous thrombosis)  ---  Acute hypoxic respiratory failure secondary to DVT and PE, likely related to malignancy -  Telemetry -  CT head -  Continue heparin infusion -   Discussed with interventional radiology, however because the patient is hemodynamically stable, recommend against IVC filter placement.  Additionally, the patient has been unreliable about taking his injectable medications previously, and if he were to forget to take his Lovenox, the IVC filter to become a nidus of clot. -  Page Doctor Inda Merlin, awaiting callback -  Anticipate transitioning to Lovenox once daily as outpatient, as is the standard of care for malignancy associated VTE -  Wean oxygen as tolerated -  Echocardiogram  Deconditioning and recent fall -  Fall precautions -  Plan to anticoagulate for at least 24 hours before PT/OT consulted.    Memory problems, patient should have 24-hour supervision and assistance with medications  -  May need skilled nursing facility at discharge   Metastatic pancreatic cancer, undergoing treatment by Doctor Julien Nordmann -  Doctor Julien Nordmann added 2 rounding list  Type 2 diabetes mellitus with vascular disease but in a setting of 40 pound weight loss -  Hemoglobin A1c -  Low dose sliding scale insulin -  Holds Lantus and oral diabetes medications  Coronary artery disease/HTN/HLD, stable.  Blood pressure is within normal limits, and in concerned that he may have a lower than normal blood pressures in the setting of recent PE -  Hold Norvasc -  Decrease metoprolol to 50 mg  Aortic stenosis, stable, no recent syncope -  Followup echocardiogram  Severe protein calorie malnutrition evidenced by 40 pound weight loss in the last months -  Liberalize diet, start supplements, nutrition consult  Normocytic anemia, likely secondary to chemotherapy  Hypokalemia, likely due to poor nutrition, oral potassium repletion  Diet:  Diabetic diet  Access:  Port  IVF:  Yes  Proph:  Heparin   Code Status: Full code, discussed at length with patient, he was going to continue to goals of care  Family Communication: Spoke to patient and his son  Disposition Plan:  Admit to telemetry   Time spent: 60 min Antwain Caliendo,  [facility] Pager [phone]  If 7PM-7AM, please contact night-coverage [email or web address] Password [facility] 07/16/2013, 4:05 PM

## 2013-07-16 NOTE — ED Notes (Signed)
Patient transported to CT 

## 2013-07-16 NOTE — ED Notes (Signed)
Right chest port accessed without difficulty.

## 2013-07-16 NOTE — Progress Notes (Addendum)
ANTICOAGULATION CONSULT NOTE - Initial Consult  Pharmacy Consult for:  IV Heparin Indication:  VTE treatment  No Known Allergies  Patient Measurements: Height: 5\' 8"  (172.7 cm) Weight: 162 lb (73.483 kg) IBW/kg (Calculated) : 68.4   Vital Signs: Temp: 98 F (36.7 C) (03/26 1226) Temp src: Oral (03/26 1226) BP: 123/96 mmHg (03/26 1226) Pulse Rate: 70 (03/26 1226)  Labs:  Recent Labs  07/14/13 1307 07/14/13 1307 07/16/13 1338  HGB 11.1*  --  10.6*  HCT 33.7*  --  32.3*  PLT 135*  --  161  CREATININE  --  0.7 0.92    Estimated Creatinine Clearance: 60.9 ml/min (by C-G formula based on Cr of 0.92).   Medical History: Past Medical History  Diagnosis Date  . Hyperlipidemia     takes Lipitor daily  . Hypertension     takes Amlodipine,Metoprolol,and Losartan daily  . Hx of colonic polyps   . Type 2 diabetes mellitus with vascular disease   . Aortic stenosis     Last ECHO Dr. Wynonia Lawman 09/24/12  AVA 0.76 assymptomatic   . CAD (coronary artery disease) 09/24/2012    Cath 07/04/2002 normal Left main, 50% stenosis proximal LAD, 99% stenosis mid LAD, 50% stenosis mid CFX, 60% stenosis proximal PDA, stent placed mid LAD  Taxus stent to mid LAD 07/04/2002   . Hypertensive heart disease     Medications:  Pending  Assessment:  Asked to assist with IV Heparin therapy for treatment of right lower extremity DVT.  Danny Waters is currently receiving chemotherapy for metastatic pancreatic adenocarcinoma.  Goals of Therapy:  Heparin level 0.3-0.7 units/ml Monitor platelets by anticoagulation protocol: Yes   Plan:   Draw baseline PT/INR, aPTT prior to starting Heparin  Give Heparin 2000 units bolus dose, then continuous infusion at 1100 units/hr.  Draw Heparin level 6 hours after infusion started.  Daily Heparin level and CBC thereafter.  [place]. 07/16/2013 3:54 PM  Addendum:  The baseline lab results are as follows: PT/INR = 14.7 / 1.17      APTT = 29 These  results are within normal ranges.  CT scan of the chest is positive for pulmonary embolism.  [place]. 07/16/2013 7:13 PM

## 2013-07-16 NOTE — ED Provider Notes (Signed)
CSN: [id number]     Arrival date & time 07/16/13  1205 History   First MD Initiated Contact with Patient 07/16/13 1330     Chief Complaint  Patient presents with  . Leg Swelling     (Consider location/radiation/quality/duration/timing/severity/associated sxs/prior Treatment) HPI 80 year old male with a history of pancreatic cancer on chemotherapy presents with bilateral leg swelling. They have been swollen in the past and seemed to be improving but over the last couple days became more swollen. It is more swollen with his legs laying down. At this point his left leg seems to be improving more than his right. He intermittently has shortness of breath, most notably when talking. At this exact time he is denying chest pain or shortness of breath. No fevers. He never had a history of heart failure. He called his doctor who sent him to the ER.  Past Medical History  Diagnosis Date  . Hyperlipidemia     takes Lipitor daily  . Hypertension     takes Amlodipine,Metoprolol,and Losartan daily  . Hx of colonic polyps   . Type 2 diabetes mellitus with vascular disease   . Aortic stenosis     Last ECHO Dr. Wynonia Lawman 09/24/12  AVA 0.76 assymptomatic   . CAD (coronary artery disease) 09/24/2012    Cath 07/04/2002 normal Left main, 50% stenosis proximal LAD, 99% stenosis mid LAD, 50% stenosis mid CFX, 60% stenosis proximal PDA, stent placed mid LAD  Taxus stent to mid LAD 07/04/2002   . Hypertensive heart disease    Past Surgical History  Procedure Laterality Date  . [place]  2003  . Hernia repair  1957  . Appendectomy    . Cardiac catheterization  10+yrs ago  . Coronary stent placement  2004    Taxus stent to mid LAD  . Colonoscopy    . Melanoma excision  06/07/2011    Procedure: MELANOMA EXCISION;  Surgeon: Harl Bowie, MD;  Location: [place];  Service: General;  Laterality: Right;  wide excision melanoma right shoulder  and left back and left upper arm   . Mastoid debridement      Family History  Problem Relation Age of Onset  . Heart disease Mother   . Hypertension Mother   . Anesthesia problems Neg Hx   . Hypotension Neg Hx   . Malignant hyperthermia Neg Hx   . Pseudochol deficiency Neg Hx   . Hypertension Father    History  Substance Use Topics  . Smoking status: Former Smoker    Quit date: 05/23/1958  . Smokeless tobacco: Never Used  . Alcohol Use: No    Review of Systems  Constitutional: Negative for fever.  Respiratory: Positive for shortness of breath.   Cardiovascular: Positive for leg swelling. Negative for chest pain.  Gastrointestinal: Negative for vomiting.  Neurological: Negative for weakness and numbness.  All other systems reviewed and are negative.      Allergies  Review of patient's allergies indicates no known allergies.  Home Medications   Current Outpatient Rx  Name  Route  Sig  Dispense  Refill  . amLODipine (NORVASC) 5 MG tablet   Oral   Take 5 mg by mouth daily.          . insulin glargine (LANTUS) 100 UNIT/ML injection   Subcutaneous   Inject 10 Units into the skin at bedtime.         . lidocaine-prilocaine (EMLA) cream   Topical   Apply 1 application topically as needed. apply  to port 1 hour before chemo appt.   30 g   0   . metFORMIN (GLUCOPHAGE) 500 MG tablet   Oral   Take 500 mg by mouth 2 (two) times daily with a meal.         . metoprolol succinate (TOPROL-XL) 100 MG 24 hr tablet   Oral   Take 100 mg by mouth every morning.          Marland Kitchen PRESCRIPTION MEDICATION      Chemo-Dr Mohhamed          BP 123/96  Pulse 70  Temp(Src) 98 F (36.7 C) (Oral)  Resp 16  Wt 162 lb (73.483 kg)  SpO2 99% Physical Exam  Nursing note and vitals reviewed. Constitutional: He is oriented to person, place, and time. He appears well-developed and well-nourished. No distress.  HENT:  Head: Normocephalic and atraumatic.  Right Ear: External ear normal.  Left Ear: External ear normal.  Nose: Nose normal.   Eyes: Right eye exhibits no discharge. Left eye exhibits no discharge.  Neck: Neck supple.  Cardiovascular: Normal rate, regular rhythm, normal heart sounds and intact distal pulses.   Pulses:      Dorsalis pedis pulses are 2+ on the right side, and 2+ on the left side.  Pulmonary/Chest: Effort normal and breath sounds normal. He has no wheezes. He has no rales.  Abdominal: Soft. There is no tenderness.  Musculoskeletal: He exhibits edema (pitting edema from knee to foot on RLE. Mild edema on left leg).  Neurological: He is alert and oriented to person, place, and time.  Skin: Skin is warm and dry.    ED Course  Procedures (including critical care time) Labs Review Labs Reviewed  CBC - Abnormal; Notable for the following:    RBC 3.25 (*)    Hemoglobin 10.6 (*)    HCT 32.3 (*)    RDW 17.0 (*)    All other components within normal limits  COMPREHENSIVE METABOLIC PANEL - Abnormal; Notable for the following:    Potassium 3.6 (*)    Glucose, Bld 126 (*)    Total Protein 5.5 (*)    Albumin 2.6 (*)    Alkaline Phosphatase 437 (*)    GFR calc non Af Amer 77 (*)    GFR calc Af Amer 89 (*)    All other components within normal limits  PRO B NATRIURETIC PEPTIDE   Imaging Review Dg Chest 2 View  07/16/2013   CLINICAL DATA:  Bilateral lower leg swelling.  EXAM: CHEST  2 VIEW  COMPARISON:  03/28/2013.  Chest CT dated 03/30/2013.  FINDINGS: Interval mildly enlarged cardiac silhouette and right jugular porta catheter. Clear lungs. Thoracic spine degenerative changes and mild scoliosis.  IMPRESSION: No acute abnormality.  Interval mild cardiomegaly.   Electronically Signed   By: Enrique Sack M.D.   On: 07/16/2013 14:33     EKG Interpretation None      *Preliminary Results*  Right lower extremity venous duplex completed.  Right lower extremity is positive for extensive, acute, occlusive deep vein thrombosis involving the right common femoral, femoral, popliteal, posterior tibial, and  peroneal veins. The right common femoral vein exhibits mobile thrombus. There is no evidence of right Baker's cyst.  Preliminary results discussed with Dr.Bronte Kropf.  07/16/2013 2:16 PM  Maudry Mayhew, RVT, RDCS, RDMS   MDM   Final diagnoses:  DVT (deep venous thrombosis)    Patient is clinically well appearing, has significantly more swelling to his right lower extremity.  No signs of respiratory illness, including normal oxygenation and no tachycardia. However he does have significantly large amount of DVT in his right deep veins. Is apparently extensive according to the sonographer. There is also evidence that the thrombus seems to be mobile. Given that he is higher risk with large clot burden I will CT scan his chest given his intermittent shortness of breath and admit to the hospitalist for IV heparin.    Ephraim Hamburger, MD 07/16/13 [phone]

## 2013-07-16 NOTE — ED Notes (Signed)
Pt alert, arrives from home, c/o swelling to legs, pt inactive due to chemotherapy, hx pancreatic cancer, resp even unlabored, skin pwd

## 2013-07-16 NOTE — Progress Notes (Addendum)
*  Preliminary Results* Right lower extremity venous duplex completed. Right lower extremity is positive for extensive, acute, occlusive deep vein thrombosis involving the right common femoral, femoral, popliteal, posterior tibial, and peroneal veins. The right common femoral vein exhibits mobile thrombus. There is no evidence of right Baker's cyst.  Preliminary results discussed with Dr.Goldston.  07/16/2013 2:16 PM  Maudry Mayhew, RVT, RDCS, RDMS

## 2013-07-17 DIAGNOSIS — I798 Other disorders of arteries, arterioles and capillaries in diseases classified elsewhere: Secondary | ICD-10-CM

## 2013-07-17 DIAGNOSIS — E1159 Type 2 diabetes mellitus with other circulatory complications: Secondary | ICD-10-CM

## 2013-07-17 DIAGNOSIS — E43 Unspecified severe protein-calorie malnutrition: Secondary | ICD-10-CM | POA: Insufficient documentation

## 2013-07-17 LAB — GLUCOSE, CAPILLARY
GLUCOSE-CAPILLARY: 123 mg/dL — AB (ref 70–99)
Glucose-Capillary: 142 mg/dL — ABNORMAL HIGH (ref 70–99)
Glucose-Capillary: 102 mg/dL — ABNORMAL HIGH (ref 70–99)
Glucose-Capillary: 199 mg/dL — ABNORMAL HIGH (ref 70–99)
Glucose-Capillary: 136 mg/dL — ABNORMAL HIGH (ref 70–99)

## 2013-07-17 LAB — CBC
HEMATOCRIT: 39.4 % (ref 39.0–52.0)
Hemoglobin: 12.7 g/dL — ABNORMAL LOW (ref 13.0–17.0)
MCH: 32.2 pg (ref 26.0–34.0)
MCHC: 32.2 g/dL (ref 30.0–36.0)
MCV: 99.7 fL (ref 78.0–100.0)
Platelets: 205 10*3/uL (ref 150–400)
RBC: 3.95 MIL/uL — ABNORMAL LOW (ref 4.22–5.81)
RDW: 17.4 % — ABNORMAL HIGH (ref 11.5–15.5)
WBC: 8.6 10*3/uL (ref 4.0–10.5)

## 2013-07-17 LAB — BASIC METABOLIC PANEL
BUN: 11 mg/dL (ref 6–23)
CALCIUM: 9.8 mg/dL (ref 8.4–10.5)
CO2: 29 mEq/L (ref 19–32)
Chloride: 105 mEq/L (ref 96–112)
Creatinine, Ser: 0.73 mg/dL (ref 0.50–1.35)
GFR, EST NON AFRICAN AMERICAN: 85 mL/min — AB (ref >90)
Glucose, Bld: 110 mg/dL — ABNORMAL HIGH (ref 70–99)
Potassium: 4 mEq/L (ref 3.7–5.3)
Sodium: 145 mEq/L (ref 137–147)

## 2013-07-17 LAB — HEPARIN LEVEL (UNFRACTIONATED): Heparin Unfractionated: 0.41 IU/mL (ref 0.30–0.70)

## 2013-07-17 MED ORDER — ENOXAPARIN SODIUM 80 MG/0.8ML ~~LOC~~ SOLN
1.0000 mg/kg | Freq: Two times a day (BID) | SUBCUTANEOUS | Status: DC
  Administered 2013-07-17 – 2013-07-20 (×7): 70 mg via SUBCUTANEOUS
  Filled 2013-07-17 (×9): qty 0.8

## 2013-07-17 MED ORDER — ENSURE COMPLETE PO LIQD
237.0000 mL | Freq: Two times a day (BID) | ORAL | Status: DC
  Administered 2013-07-17 – 2013-07-20 (×7): 237 mL via ORAL

## 2013-07-17 MED ORDER — ENOXAPARIN (LOVENOX) PATIENT EDUCATION KIT
PACK | Freq: Once | Status: AC
  Administered 2013-07-17: 12:00:00
  Filled 2013-07-17: qty 1

## 2013-07-17 NOTE — Progress Notes (Signed)
Rx Brief Anticoagulation note:  IV Heparin  Assessment:  HL=0.47 units/ml after 2000 unit bolus and drip @ 1100 units/hr x ~ 6 hours  No IV interuptions or bleeding per RN  Plan:  Continue heparin drip @ 1100 units/hr  Recheck HL in am  Dorrene German ,07/17/2013 12:12 AM

## 2013-07-17 NOTE — Progress Notes (Addendum)
INITIAL NUTRITION ASSESSMENT  DOCUMENTATION CODES Per approved criteria  -Severe malnutrition in the context of chronic illness  Pt meets criteria for severe MALNUTRITION in the context of chronic illness as evidenced by 10% weight loss in 3 months, PO intake <75% for > one month, moderate muscle wasting and subcutaneous fat loss.   INTERVENTION: -Recommend to liberalize diet to Regular to encourage PO intake -Recommend Ensure Complete po BID, each supplement provides 350 kcal and 13 grams of protein -Encouraged high protein/kcal snacks and balanced meals -Will continue to monitior  NUTRITION DIAGNOSIS: Unintentional wt loss related to inadequate energy intake as evidenced by unintentional wt loss of 40 lbs   Goal: Pt to meet >/= 90% of their estimated nutrition needs    Monitor:  Total protein/energy intake, labs, weights, diet education needs  Reason for Assessment: Consult/MST  80 y.o. male  Admitting Dx: <principal problem not specified>  ASSESSMENT: The patient is a 80 y.o. year-old male with history of metastatic pancreatic cancer, malignant melanoma, CAD s/p stents, HTN/HLD who presents with lower extremity swelling. The patient has been receiving palliative chemotherapy from Dr. Julien Nordmann for his metastatic pancreatic cancer. He noticed some swelling of his right leg a couple of months ago. 2 weeks ago, he developed increasing swelling, redness and warmth of his right leg. For the last week or so he has been somewhat short of breath, progressively worse over the last 2 days. He denies cough, fevers, wheezing, chest pain, palpitations. His shortness of breath is worse with exertion but he does not notice any change with position. His son describes his breathing as if the patient had recently run a marathon. Because his lower extremity swelling had increased, the family called the oncology clinic who referred the patient to the emergency department.  -Pt reported an unintentional  wt loss of 40 lbs in one month -Per previous medical records, weight loss may have occurred more gradually, as pt weighed 196 lbs 6 months ago -Pt has lost 20 lbs in past 3 months -Diet recall indicated pt consuming 1-2 meals/day. Pt usually consumes breakfast of coffee and toast, and will sometimes eat dinner meal -Denied any nausea/abd pain or early satiety -Pt reported that he had been advised to lose weight, normal weight used to be around 300 lbs -Has been restricting snack and meal intake to assist in weight loss. Relayed to patient importance of maintaining weight during chemo treatments and encouraged intake of snacks/balanced meals -PO intake >50% of breakfast, reported good appetite, denied n/v post meals -Was willing to trial Chocolate Ensure during admit. Informed pt to continue with supplement upon d/c  -Pt noted that he prepares own meals, lives by himself  Nutrition Focused Physical Exam:  Subcutaneous Fat:  Orbital Region: WDL Upper Arm Region: moderate Thoracic and Lumbar Region: n/a  Muscle:  Temple Region: moderate Clavicle Bone Region: moderate Clavicle and Acromion Bone Region: moderate Scapular Bone Region: n/a Dorsal Hand: WDL Patellar Region: WDL Anterior Thigh Region: WDL Posterior Calf Region: n/a-pt with edema; unable to assess  Edema: +2 edema in RLE, nonpitting edema in LLE    Height: Ht Readings from Last 1 Encounters:  07/16/13 5\' 8"  (1.727 m)    Weight: Wt Readings from Last 1 Encounters:  07/17/13 156 lb 12 oz (71.1 kg)    Ideal Body Weight: 154 lbs  % Ideal Body Weight: 101%  Wt Readings from Last 10 Encounters:  07/17/13 156 lb 12 oz (71.1 kg)  06/30/13 162 lb 3.2 oz (73.573  kg)  06/02/13 171 lb 11.2 oz (77.883 kg)  05/19/13 175 lb (79.379 kg)  04/21/13 171 lb 12.8 oz (77.928 kg)  04/06/13 178 lb 11.2 oz (81.058 kg)  03/28/13 190 lb (86.183 kg)  12/29/12 195 lb 4.8 oz (88.587 kg)  06/25/12 196 lb 6.4 oz (89.086 kg)  12/26/11  206 lb 3.2 oz (93.532 kg)    Usual Body Weight: 196 lbs  % Usual Body Weight: 80%  BMI:  Body mass index is 23.84 kg/(m^2).  Estimated Nutritional Needs: Kcal: 2100-2300 gram Protein: 90-100 Fluid: >/=2100 ml/daily  Skin: WDL  Diet Order: Carb Control  EDUCATION NEEDS: -Education needs addressed   Intake/Output Summary (Last 24 hours) at 07/17/13 1006 Last data filed at 07/17/13 0453  Gross per 24 hour  Intake 846.67 ml  Output      0 ml  Net 846.67 ml    Last BM: 3/27   Labs:   Recent Labs Lab 07/14/13 1307 07/16/13 1338 07/17/13 0403  NA 139 144 145  K 3.2* 3.6* 4.0  CL  --  106 105  CO2 25 27 29   BUN 9.4 11 11   CREATININE 0.7 0.92 0.73  CALCIUM 9.3 9.4 9.8  GLUCOSE 104 126* 110*    CBG (last 3)   Recent Labs  07/16/13 1722 07/16/13 2329 07/17/13 0724  GLUCAP 92 142* 102*    Scheduled Meds: . enoxaparin (LOVENOX) injection  1 mg/kg Subcutaneous Q12H  . enoxaparin   Does not apply Once  . feeding supplement (ENSURE COMPLETE)  237 mL Oral BID BM  . insulin aspart  0-9 Units Subcutaneous TID WC  . metoprolol succinate  50 mg Oral q morning - 10a  . sodium chloride  3 mL Intravenous Q12H    Continuous Infusions: . sodium chloride 50 mL/hr at 07/16/13 2001    Past Medical History  Diagnosis Date  . Hyperlipidemia     takes Lipitor daily  . Hypertension     takes Amlodipine,Metoprolol,and Losartan daily  . Hx of colonic polyps   . Type 2 diabetes mellitus with vascular disease   . Aortic stenosis     Last ECHO Dr. Wynonia Lawman 09/24/12  AVA 0.76 assymptomatic   . CAD (coronary artery disease) 09/24/2012    Cath 07/04/2002 normal Left main, 50% stenosis proximal LAD, 99% stenosis mid LAD, 50% stenosis mid CFX, 60% stenosis proximal PDA, stent placed mid LAD  Taxus stent to mid LAD 07/04/2002   . Hypertensive heart disease   . Pancreatic cancer     metastatic, tx by Dr. Julien Nordmann  . Malignant melanoma     Past Surgical History  Procedure  Laterality Date  . [place]  2003  . Hernia repair  1957  . Appendectomy    . Cardiac catheterization  10+yrs ago  . Coronary stent placement  2004    Taxus stent to mid LAD  . Colonoscopy    . Melanoma excision  06/07/2011    Procedure: MELANOMA EXCISION;  Surgeon: Harl Bowie, MD;  Location: [facility];  Service: General;  Laterality: Right;  wide excision melanoma right shoulder  and left back and left upper arm   . Mastoid debridement      Atlee Abide MS RD LDN Clinical Dietitian [phone]

## 2013-07-17 NOTE — Progress Notes (Addendum)
ANTICOAGULATION CONSULT NOTE - Initial Consult  Pharmacy Consult for:  IV Heparin--->Lovenox Indication:  VTE treatment  No Known Allergies  Patient Measurements: Height: 5\' 8"  (172.7 cm) Weight: 156 lb 12 oz (71.1 kg) IBW/kg (Calculated) : 68.4   Vital Signs: Temp: 97.5 F (36.4 C) (03/27 0401) Temp src: Oral (03/27 0401) BP: 135/72 mmHg (03/27 0401) Pulse Rate: 65 (03/27 0401)  Labs:  Recent Labs  07/14/13 1307 07/14/13 1307 07/16/13 1338 07/16/13 1530 07/16/13 2215 07/17/13 0403  HGB 11.1*  --  10.6*  --   --  12.7*  HCT 33.7*  --  32.3*  --   --  39.4  PLT 135*  --  161  --   --  205  APTT  --   --   --  29  --   --   LABPROT  --   --   --  14.7  --   --   INR  --   --   --  1.17  --   --   HEPARINUNFRC  --   --   --   --  0.47 0.41  CREATININE  --  0.7 0.92  --   --  0.73    Estimated Creatinine Clearance: 70.1 ml/min (by C-G formula based on Cr of 0.73).   Medical History: Past Medical History  Diagnosis Date  . Hyperlipidemia     takes Lipitor daily  . Hypertension     takes Amlodipine,Metoprolol,and Losartan daily  . Hx of colonic polyps   . Type 2 diabetes mellitus with vascular disease   . Aortic stenosis     Last ECHO Dr. Wynonia Lawman 09/24/12  AVA 0.76 assymptomatic   . CAD (coronary artery disease) 09/24/2012    Cath 07/04/2002 normal Left main, 50% stenosis proximal LAD, 99% stenosis mid LAD, 50% stenosis mid CFX, 60% stenosis proximal PDA, stent placed mid LAD  Taxus stent to mid LAD 07/04/2002   . Hypertensive heart disease   . Pancreatic cancer     metastatic, tx by Dr. Julien Nordmann  . Malignant melanoma     Assessment:  66 yoM with PMHx metastatic pancreatic cancer, malignant melanoma, CAD s/p stents receiving palliative chemotherapy presents with extensive RLE DVT and PE.  IV heparin started 3/26 per pharmacy.    IV heparin currently running at 1100 units/hr  Heparin level this morning = 0.41, therapeutic.    CBC: no issues noted  Goals of  Therapy:  Heparin level 0.3-0.7 units/ml Monitor platelets by anticoagulation protocol: Yes   Plan:   Continue IV heparin at current rate  F/u long-term plans -?switch to LMWH  Daily heparin level and CBC  Ralene Bathe, PharmD, BCPS 07/17/2013, 7:15 AM  Pager: [phone]  Addendum: Pharmacy consulted to transition pt to lovenox.  No issues per RN overnight.    Plan:  Turn off heparin at 0800, start lovenox 1mg /kg q12h at 0900 (1 hour off in between agents).  RN aware.  Near discharge, consider changing to once daily 1.5mg /kg lovenox dosing.    Ralene Bathe, PharmD, BCPS 07/17/2013, 7:52 AM  Pager: [phone]

## 2013-07-17 NOTE — Progress Notes (Signed)
Patient alert but forgetful. Educated patient on Lovenox injection for home care, he stated he will be able to administer it,  But I don't think he will remember to give it at home due to memory impairment. Continue to get out of bed without calling for help even after reinforcing  why he is on bed rest.

## 2013-07-17 NOTE — Progress Notes (Signed)
CARE MANAGEMENT NOTE 07/17/2013  Patient:  Danny Waters, Danny Waters   Account Number:  [id number]  Date Initiated:  07/17/2013  Documentation initiated by:  Timber Lucarelli  Subjective/Objective Assessment:   pt admitted with chest tightness and shortness of breath. CT scan of chest confirmed pulmonary emboli.     Action/Plan:   home when stable/patient does live alone has adult children for support.   Anticipated DC Date:  07/20/2013   Anticipated DC Plan:  HOME/SELF CARE  In-house referral  NA      DC Planning Services  NA      [facility] Choice  NA   Choice offered to / List presented to:  NA   DME arranged  NA      DME agency  NA     [place] arranged  NA      [place] agency  NA   Status of service:  In process, will continue to follow Medicare Important Message given?  NA - LOS <3 / Initial given by admissions (If response is "NO", the following Medicare IM given date fields will be blank) Date Medicare IM given:   Date Additional Medicare IM given:    Discharge Disposition:    Per UR Regulation:  Reviewed for med. necessity/level of care/duration of stay  If discussed at [place] of Stay Meetings, dates discussed:    Comments:  [id number]/Hallelujah Wysong Eldridge Dace, BSN, [place] [phone] Chart Reviewed for discharge and hospital needs. Discharge needs at time of review:  None present will follow for needs. Review of patient progress due on [id number].

## 2013-07-17 NOTE — Progress Notes (Signed)
[facility] PROGRESS NOTE  Danny Waters EQA:[id number] DOB: 1929-08-02 DOA: 07/16/2013 PCP: Florina Ou, MD  Assessment/Plan  Acute hypoxic respiratory failure secondary to RLE DVT and PE, likely related to malignancy.  Area of mobile thrombus in right femoral vein concerning for imminent PE.  Discussed with interventional radiology, however because the patient is hemodynamically stable, recommend against IVC filter placement. Additionally, the patient has been unreliable about taking his injectable medications previously, and if he were to forget to take his Lovenox, the IVC filter to become a nidus of clot.  Anticoagulate for minimum of 24 hours prior to OOB and working with PT/OT to allow clot to adhere to vessel wall and reduce risk of embolism.   - Telemetry:  Sinus bradycardia to 40s - CT head:  Neg for obvious mets - transition to lovenox - lovenox teaching - Wean oxygen as tolerated  - Echocardiogram   Deconditioning and recent fall  - Fall precautions  - PT/OT consults tomorrow   Memory problems, patient needs 24-hour supervision and assistance with medications  - May need skilled nursing facility at discharge   Metastatic pancreatic cancer with progression and more mets on last CT ab/pelvis despite undergoing chemotherapy by Doctor Julien Nordmann.  Prognosis guarded and anticipate less than 6 month life expectancy. - Doctor Julien Nordmann added to rounding list   Type 2 diabetes mellitus with vascular disease but in a setting of 40 pound weight loss  - Hemoglobin A1c 6.2 - Low dose sliding scale insulin  - will likely only need very low dose metformin or no medications at discharge.    Coronary artery disease/HTN/HLD, stable. Blood pressure is within normal limits but bradycardic even with reduced beta blocker dose.  - Hold Norvasc  - Decrease metoprolol to 25 mg   Aortic stenosis, stable, no recent syncope  - Followup echocardiogram   Severe protein calorie malnutrition  evidenced by 40 pound weight loss in the last months  - Liberalize diet, start supplements -  Appreciate nutrition recommendations  Normocytic anemia, likely secondary to chemotherapy   Hypokalemia, likely due to poor nutrition, oral potassium repletion  Elevated alk phos likely from liver mets  Diet:  Regular  Access:  Port IVF:  Yes Proph:  lovenox  Code Status: full code Family Communication: patient alone Disposition Plan:  PT/OT consults tomorrow and will need 24h supervision at discharge.  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  Depending on progression, consider SNF/ALF with hospice following or [facility].     Consultants:  Dr. Julien Nordmann  Procedures:  CT abd/pelvis  Antibiotics:  none   HPI/Subjective:  States he feels well, but he is on more oxygen than yesterday    Objective: Filed Vitals:   07/16/13 2326 07/17/13 0401 07/17/13 0904 07/17/13 1437  BP: 124/77 135/72 113/56 122/63  Pulse: 58 65 77 70  Temp: 97.6 F (36.4 C) 97.5 F (36.4 C)    TempSrc: Oral Oral  Oral  Resp: $Remo'20 20  20  'jPivF$ Height:      Weight:  71.1 kg (156 lb 12 oz)    SpO2: 99% 93%  93%    Intake/Output Summary (Last 24 hours) at 07/17/13 1443 Last data filed at 07/17/13 1239  Gross per 24 hour  Intake   1235 ml  Output    100 ml  Net   1135 ml   Filed Weights   07/16/13 1226 07/16/13 1707 07/17/13 0401  Weight: 73.483 kg (162 lb) 73.483 kg (162 lb) 71.1 kg (156 lb 12 oz)    Exam:   General:  Cachectic CM, No acute distress  HEENT:  NCAT, MMM  Cardiovascular:  RRR, nl S1, S2 no mrg, 2+ pulses, warm extremities  Respiratory:  CTAB, no increased WOB  Abdomen:   NABS, soft, NT/ND  MSK:   Normal tone and bulk, 2+ RLE and 1+ LLE edema. Right shin/calf pink and warm to touch.   Neuro:  Grossly intact  Data Reviewed: Basic Metabolic Panel:  Recent Labs Lab 07/14/13 1307 07/16/13 1338 07/17/13 0403  NA 139  144 145  K 3.2* 3.6* 4.0  CL  --  106 105  CO2 $Re'25 27 29  'LbG$ GLUCOSE 104 126* 110*  BUN 9.$Remo'4 11 11  'DPtMv$ CREATININE 0.7 0.92 0.73  CALCIUM 9.3 9.4 9.8   Liver Function Tests:  Recent Labs Lab 07/14/13 1307 07/16/13 1338  AST 26 22  ALT 23 20  ALKPHOS 500* 437*  BILITOT 1.34* 1.1  PROT 5.8* 5.5*  ALBUMIN 2.8* 2.6*   No results found for this basename: LIPASE, AMYLASE,  in the last 168 hours No results found for this basename: AMMONIA,  in the last 168 hours CBC:  Recent Labs Lab 07/14/13 1307 07/16/13 1338 07/17/13 0403  WBC 6.1 7.2 8.6  NEUTROABS 4.2  --   --   HGB 11.1* 10.6* 12.7*  HCT 33.7* 32.3* 39.4  MCV 98.9* 99.4 99.7  PLT 135* 161 205   Cardiac Enzymes: No results found for this basename: CKTOTAL, CKMB, CKMBINDEX, TROPONINI,  in the last 168 hours BNP (last 3 results)  Recent Labs  07/16/13 1338  PROBNP 496.5*   CBG:  Recent Labs Lab 07/16/13 1722 07/16/13 2329 07/17/13 0724 07/17/13 1118  GLUCAP 92 142* 102* 199*    No results found for this or any previous visit (from the past 240 hour(s)).   Studies: Dg Chest 2 View  07/16/2013   CLINICAL DATA:  Bilateral lower leg swelling.  EXAM: CHEST  2 VIEW  COMPARISON:  03/28/2013.  Chest CT dated 03/30/2013.  FINDINGS: Interval mildly enlarged cardiac silhouette and right jugular porta catheter. Clear lungs. Thoracic spine degenerative changes and mild scoliosis.  IMPRESSION: No acute abnormality.  Interval mild cardiomegaly.   Electronically Signed   By: Enrique Sack M.D.   On: 07/16/2013 14:33   Ct Head Wo Contrast  07/16/2013   CLINICAL DATA:  Metastatic melanoma and pancreatic cancer. Chest CTA performed earlier today.  EXAM: CT HEAD WITHOUT CONTRAST  TECHNIQUE: Contiguous axial images were obtained from the base of the skull through the vertex without intravenous contrast.  COMPARISON:  03/29/2013.  FINDINGS: No significant change in mild diffuse enlargement of the ventricles and subarachnoid spaces. Mild  patchy white matter low density is again demonstrated in both cerebral hemispheres. No intracranial hemorrhage, mass lesion or CT evidence of acute infarction. Mild diffuse ectasia of the basilar artery with a small amount of atheromatous calcification is again demonstrated. Unremarkable bones and included paranasal sinuses.  IMPRESSION: 1. No acute abnormality. A non contrast head CT does not exclude the possibility of metastatic disease. This could be better determined with a brain MR without and with contrast if clinically indicated. 2. Stable atrophy and chronic small vessel white matter ischemic changes.   Electronically Signed   By: Enrique Sack M.D.   On: 07/16/2013 20:01   Ct Angio Chest Pe W/cm &/or Wo Cm  07/16/2013   CLINICAL DATA:  Swollen legs.  History pancreatic cancer.  EXAM: CT ANGIOGRAPHY CHEST WITH CONTRAST  TECHNIQUE: Multidetector CT imaging of the chest was performed using the standard protocol during bolus administration of intravenous contrast. Multiplanar CT image reconstructions and MIPs were obtained to evaluate the vascular anatomy.  CONTRAST:  169mL OMNIPAQUE IOHEXOL 350 MG/ML SOLN  COMPARISON:  DG CHEST 2 VIEW dated 07/16/2013; CT CHEST W/CM dated 03/30/2013  FINDINGS: Thoracic aortic ectasia and atherosclerotic vascular calcification. No aortic dissection. Prominent pulmonary emboli in of the right main and segmental pulmonary arteries. Right ventricular left ventricular ratio is greater than 1.0.This suggests possibility of right ventricular strain (intermediate risk sub massive pulmonary embolus).  Shotty mediastinal lymph nodes are present. Cardiomegaly with coronary artery disease present. Aortic and mitral valve disease cannot be excluded.  Large airways are patent. Tiny 6 mm nodular density right middle lobe is again noted. No other focal pulmonary lesion identified. Mild basilar atelectasis noted.  Again noted is diffuse severe metastatic disease to the liver. Pancreatic tail  mass is again noted and unchanged. Innumerable nodular densities are noted in the region of the gastrohepatic ligament most likely metastatic lymph nodes.  Thyroid unremarkable. Port-A-Cath noted with its tip in right atrium. No significant axillary adenopathy. Chest wall is intact. Degenerative changes thoracic spine  Review of the MIP images confirms the above findings.  IMPRESSION: 1. Prominent pulmonary emboli in the right main and segmental pulmonary arteries. Right ventricular to left ventricular ratio is greater than 1.0 suggesting right ventricular strain. Critical Value/emergent results were called by telephone at the time of interpretation on 07/16/2013 at 4:37 PM to Dr. Aline Brochure , who verbally acknowledged these results. 2. Coronary artery disease. Cardiomegaly. Aortic and mitral valve disease cannot be excluded. 3. Tiny 6 mm nodular density right middle lobe is unchanged. 4. Again noted is diffuse severe metastatic disease to the liver. Pancreatic tail mass is unchanged. Noted on today's exam are multiple nodular densities in the gastrohepatic ligament region consistent with metastatic adenopathy.   Electronically Signed   By: Marcello Moores  Register   On: 07/16/2013 16:39    Scheduled Meds: . enoxaparin (LOVENOX) injection  1 mg/kg Subcutaneous Q12H  . feeding supplement (ENSURE COMPLETE)  237 mL Oral BID BM  . insulin aspart  0-9 Units Subcutaneous TID WC  . metoprolol succinate  50 mg Oral q morning - 10a  . sodium chloride  3 mL Intravenous Q12H   Continuous Infusions: . sodium chloride 50 mL/hr at 07/17/13 1329    Active Problems:   Malignant melanoma   CAD (coronary artery disease)   Type 2 diabetes mellitus with vascular disease   Pancreatic cancer metastasized to liver   DVT (deep venous thrombosis)   Acute pulmonary embolism   Protein-calorie malnutrition, severe    Time spent: 30 min    Ahlayah Tarkowski, [facility] Pager [phone]. If 7PM-7AM, please contact  night-coverage at [email or web address], password [facility] 07/17/2013, 2:43 PM  LOS: 1 day

## 2013-07-18 DIAGNOSIS — I359 Nonrheumatic aortic valve disorder, unspecified: Secondary | ICD-10-CM

## 2013-07-18 LAB — CBC
HCT: 31.9 % — ABNORMAL LOW (ref 39.0–52.0)
HEMOGLOBIN: 10.2 g/dL — AB (ref 13.0–17.0)
MCH: 32 pg (ref 26.0–34.0)
MCHC: 32 g/dL (ref 30.0–36.0)
MCV: 100 fL (ref 78.0–100.0)
PLATELETS: 159 10*3/uL (ref 150–400)
RBC: 3.19 MIL/uL — ABNORMAL LOW (ref 4.22–5.81)
RDW: 17.2 % — ABNORMAL HIGH (ref 11.5–15.5)
WBC: 6.5 10*3/uL (ref 4.0–10.5)

## 2013-07-18 LAB — GLUCOSE, CAPILLARY
GLUCOSE-CAPILLARY: 118 mg/dL — AB (ref 70–99)
Glucose-Capillary: 92 mg/dL (ref 70–99)

## 2013-07-18 MED ORDER — SODIUM CHLORIDE 0.9 % IJ SOLN: 10.0000 mL | Freq: Two times a day (BID) | INTRAMUSCULAR | Status: DC

## 2013-07-18 MED ORDER — ENOXAPARIN (LOVENOX) PATIENT EDUCATION KIT
PACK | Freq: Once | Status: AC
  Administered 2013-07-18: 08:00:00
  Filled 2013-07-18: qty 1

## 2013-07-18 MED ORDER — SODIUM CHLORIDE 0.9 % IJ SOLN
10.0000 mL | INTRAMUSCULAR | Status: DC | PRN
  Administered 2013-07-18 – 2013-07-20 (×4): 10 mL

## 2013-07-18 NOTE — Evaluation (Signed)
Physical Therapy Evaluation Patient Details Name: Danny Waters MRN: [id number] DOB: 06-Jun-1929 Today's Date: 07/18/2013   History of Present Illness  Acute hypoxic respiratory failure secondary to RLE DVT and PE, likely related to malignancy.  Clinical Impression  Pt motivated to work with PT; not allowed to sit up in chair even with alarm per RN request;  Will continue to follow; plan for HHPT at this time    Follow Up Recommendations Home health PT;Supervision - Intermittent    Equipment Recommendations  Rolling walker with 5" wheels    Recommendations for Other Services       Precautions / Restrictions Precautions Precaution Comments: O2 sats      Mobility  Bed Mobility Overal bed mobility: Needs Assistance Bed Mobility: Supine to Sit;Sit to Supine     Supine to sit: Supervision Sit to supine: Supervision   General bed mobility comments: for safety  Transfers Overall transfer level: Needs assistance   Transfers: Sit to/from Stand Sit to Stand: Min guard         General transfer comment: cues for safe technique  Ambulation/Gait Ambulation/Gait assistance: Min guard Ambulation Distance (Feet): 100 Feet Assistive device: Rolling walker (2 wheeled);None Gait Pattern/deviations: Decreased stride length     General Gait Details: LOB x 2 with min to recover; delayed reactions  Stairs            Wheelchair Mobility    Modified Rankin (Stroke Patients Only)       Balance Overall balance assessment: Needs assistance Sitting-balance support: No upper extremity supported;Feet supported Sitting balance-Leahy Scale: Good Sitting balance - Comments: during functional activity-donning socks   Standing balance support: No upper extremity supported Standing balance-Leahy Scale: Good                       Pertinent Vitals/Pain Pulse oximetry on room air is 89-97% on RA during activity, O2 replaced at rest.     Home Living Family/patient  expects to be discharged to:: Private residence Living Arrangements: Alone   Type of Home: House Home Access: Stairs to enter   Technical brewer of Steps: 3 Home Layout: One level Home Equipment: Cane - single point Additional Comments: pt does not drive unless he "absolutely has to"; son does errands/MDappts etc    Prior Function Level of Independence: Independent               Hand Dominance        Extremity/Trunk Assessment   Upper Extremity Assessment: Generalized weakness           Lower Extremity Assessment: Generalized weakness         Communication   Communication: No difficulties  Cognition Arousal/Alertness: Awake/alert Behavior During Therapy: WFL for tasks assessed/performed Overall Cognitive Status: Within Functional Limits for tasks assessed                      General Comments      Exercises        Assessment/Plan    PT Assessment Patient needs continued PT services  PT Diagnosis Difficulty walking   PT Problem List Decreased strength;Decreased activity tolerance;Decreased balance;Decreased mobility  PT Treatment Interventions DME instruction;Gait training;Functional mobility training;Therapeutic activities;Therapeutic exercise;Patient/family education   PT Goals (Current goals can be found in the Care Plan section) Acute Rehab PT Goals Patient Stated Goal: to get back to his walking 1x per wk PT Goal Formulation: With patient Time For Goal Achievement: 07/25/13 Potential to  Achieve Goals: Good    Frequency Min 3X/week   Barriers to discharge        End of Session Equipment Utilized During Treatment: Gait belt Activity Tolerance: Patient tolerated treatment well Patient left: in bed;with bed alarm set         Time: 1130-1203 PT Time Calculation (min): 33 min   Charges:   PT Evaluation $Initial PT Evaluation Tier I: 1 Procedure PT Treatments $Gait Training: 23-37 mins   PT G Codes:           Adrielle Polakowski 07/18/2013, 12:09 PM

## 2013-07-18 NOTE — Progress Notes (Signed)
[facility] PROGRESS NOTE  Danny Waters [id number] DOB: 09/17/1929 DOA: 07/16/2013 PCP: Danny Ou, MD  Assessment/Plan  Acute hypoxic respiratory failure secondary to RLE DVT and PE, likely related to malignancy.  Area of mobile thrombus in right femoral vein concerning for imminent PE.  Discussed with interventional radiology, however because the patient is hemodynamically stable, recommend against IVC filter placement.  - Telemetry:  Sinus bradycardia to 40s - CT head:  Neg for obvious mets - Continue lovenox - Wean oxygen as tolerated  - Echocardiogram:  Mildly reduced RV function   Deconditioning and recent fall  - Fall precautions  - PT recommending rolling walker with 5" wheels - Due to memory problems and anticipated quick decline due to progressive cancer, patient not safe to return home  Memory problems, patient needs 24-hour supervision and assistance with medications  - Inpatient hospice vs. SNF with hospice following  Metastatic pancreatic cancer with progression and more mets on last CT ab/pelvis despite - Dr. Julien Waters expects quick decline and recommends transfer to inpatient hospice  Type 2 diabetes mellitus with vascular disease but in a setting of 40 pound weight loss  - Hemoglobin A1c 6.2 - Low dose sliding scale insulin  - will likely only need very low dose metformin or no medications at discharge.    Coronary artery disease/HTN/HLD, stable. Blood pressure is within normal limits but bradycardic even with reduced beta blocker dose.  -  D/c beta blocker  Severe aortic stenosis, with mean gradient 52 mmHg and peak gradient 78 mmHg  Severe protein calorie malnutrition evidenced by 40 pound weight loss in the last months  - Liberalize diet, start supplements - Appreciate nutrition recommendations  Normocytic anemia, likely secondary to chemotherapy   Hypokalemia, likely due to poor nutrition, oral potassium repletion  Elevated alk phos likely  from liver mets  Diet:  Regular  Access:  Port IVF:  Yes Proph:  lovenox  Code Status:  DNR - discussed with patient and his son.   Family Communication: patient and son.  Patient not able to make medical decisions due to dementia, lack of comprehension and poor [name] term memory.  Talked about GOC to be made as comfortable as possible.  Will try to limit painful procedures but continue to treat DVT/PE at this time.    Disposition Plan:   Recommend inpatient hospice.  If declined, will need SNF with hospice to follow.  SW following   Consultants:  Dr. Julien Waters  Procedures:  CT abd/pelvis  Antibiotics:  none   HPI/Subjective:  States he feels well.  Does not remember me.    Objective: Filed Vitals:   07/17/13 1437 07/17/13 2056 07/18/13 0439 07/18/13 0751  BP: 122/63 147/66 137/69   Pulse: 70 76 65   Temp:  97.3 F (36.3 C) 98.2 F (36.8 C)   TempSrc: Oral Oral Oral   Resp: $Remo'20 19 18   'neSfW$ Height:      Weight:   72.6 kg (160 lb 0.9 oz)   SpO2: 93% 95% 97% 95%    Intake/Output Summary (Last 24 hours) at 07/18/13 1255 Last data filed at 07/18/13 0909  Gross per 24 hour  Intake 1467.5 ml  Output   1326 ml  Net  141.5 ml   Filed Weights   07/16/13 1707 07/17/13 0401 07/18/13 0439  Weight: 73.483 kg (162 lb) 71.1 kg (156 lb 12 oz) 72.6 kg (160 lb 0.9 oz)    Exam:   General:  Cachectic CM, No acute distress  HEENT:  NCAT, MMM  Cardiovascular:  RRR, nl S1, S2 no mrg, 2+ pulses, warm extremities  Respiratory:  CTAB, no increased WOB  Abdomen:   NABS, soft, NT/ND  MSK:   Normal tone and bulk, 2+ RLE and 1+ LLE edema. Right shin/calf jess pink and warm to touch.   Neuro:  Grossly intact  Data Reviewed: Basic Metabolic Panel:  Recent Labs Lab 07/14/13 1307 07/16/13 1338 07/17/13 0403  NA 139 144 145  K 3.2* 3.6* 4.0  CL  --  106 105  CO2 $Re'25 27 29  'bdY$ GLUCOSE 104 126* 110*  BUN 9.$Remo'4 11 11  'CdbyC$ CREATININE 0.7 0.92 0.73  CALCIUM 9.3 9.4 9.8   Liver Function  Tests:  Recent Labs Lab 07/14/13 1307 07/16/13 1338  AST 26 22  ALT 23 20  ALKPHOS 500* 437*  BILITOT 1.34* 1.1  PROT 5.8* 5.5*  ALBUMIN 2.8* 2.6*   No results found for this basename: LIPASE, AMYLASE,  in the last 168 hours No results found for this basename: AMMONIA,  in the last 168 hours CBC:  Recent Labs Lab 07/14/13 1307 07/16/13 1338 07/17/13 0403 07/18/13 0506  WBC 6.1 7.2 8.6 6.5  NEUTROABS 4.2  --   --   --   HGB 11.1* 10.6* 12.7* 10.2*  HCT 33.7* 32.3* 39.4 31.9*  MCV 98.9* 99.4 99.7 100.0  PLT 135* 161 205 159   Cardiac Enzymes: No results found for this basename: CKTOTAL, CKMB, CKMBINDEX, TROPONINI,  in the last 168 hours BNP (last 3 results)  Recent Labs  07/16/13 1338  PROBNP 496.5*   CBG:  Recent Labs Lab 07/17/13 1118 07/17/13 1719 07/17/13 2136 07/18/13 0742 07/18/13 1143  GLUCAP 199* 123* 136* 92 118*    No results found for this or any previous visit (from the past 240 hour(s)).   Studies: Dg Chest 2 View  07/16/2013   CLINICAL DATA:  Bilateral lower leg swelling.  EXAM: CHEST  2 VIEW  COMPARISON:  03/28/2013.  Chest CT dated 03/30/2013.  FINDINGS: Interval mildly enlarged cardiac silhouette and right jugular porta catheter. Clear lungs. Thoracic spine degenerative changes and mild scoliosis.  IMPRESSION: No acute abnormality.  Interval mild cardiomegaly.   Electronically Signed   By: Danny Waters M.D.   On: 07/16/2013 14:33   Ct Head Wo Contrast  07/16/2013   CLINICAL DATA:  Metastatic melanoma and pancreatic cancer. Chest CTA performed earlier today.  EXAM: CT HEAD WITHOUT CONTRAST  TECHNIQUE: Contiguous axial images were obtained from the base of the skull through the vertex without intravenous contrast.  COMPARISON:  03/29/2013.  FINDINGS: No significant change in mild diffuse enlargement of the ventricles and subarachnoid spaces. Mild patchy white matter low density is again demonstrated in both cerebral hemispheres. No intracranial  hemorrhage, mass lesion or CT evidence of acute infarction. Mild diffuse ectasia of the basilar artery with a small amount of atheromatous calcification is again demonstrated. Unremarkable bones and included paranasal sinuses.  IMPRESSION: 1. No acute abnormality. A non contrast head CT does not exclude the possibility of metastatic disease. This could be better determined with a brain MR without and with contrast if clinically indicated. 2. Stable atrophy and chronic small vessel white matter ischemic changes.   Electronically Signed   By: Danny Waters M.D.   On: 07/16/2013 20:01   Ct Angio Chest Pe W/cm &/or Wo Cm  07/16/2013   CLINICAL DATA:  Swollen legs.  History pancreatic cancer.  EXAM: CT ANGIOGRAPHY CHEST WITH CONTRAST  TECHNIQUE: Multidetector CT imaging of the chest was performed using the standard protocol during bolus administration of intravenous contrast. Multiplanar CT image reconstructions and MIPs were obtained to evaluate the vascular anatomy.  CONTRAST:  184mL OMNIPAQUE IOHEXOL 350 MG/ML SOLN  COMPARISON:  DG CHEST 2 VIEW dated 07/16/2013; CT CHEST W/CM dated 03/30/2013  FINDINGS: Thoracic aortic ectasia and atherosclerotic vascular calcification. No aortic dissection. Prominent pulmonary emboli in of the right main and segmental pulmonary arteries. Right ventricular left ventricular ratio is greater than 1.0.This suggests possibility of right ventricular strain (intermediate risk sub massive pulmonary embolus).  Shotty mediastinal lymph nodes are present. Cardiomegaly with coronary artery disease present. Aortic and mitral valve disease cannot be excluded.  Large airways are patent. Tiny 6 mm nodular density right middle lobe is again noted. No other focal pulmonary lesion identified. Mild basilar atelectasis noted.  Again noted is diffuse severe metastatic disease to the liver. Pancreatic tail mass is again noted and unchanged. Innumerable nodular densities are noted in the region of the  gastrohepatic ligament most likely metastatic lymph nodes.  Thyroid unremarkable. Port-A-Cath noted with its tip in right atrium. No significant axillary adenopathy. Chest wall is intact. Degenerative changes thoracic spine  Review of the MIP images confirms the above findings.  IMPRESSION: 1. Prominent pulmonary emboli in the right main and segmental pulmonary arteries. Right ventricular to left ventricular ratio is greater than 1.0 suggesting right ventricular strain. Critical Value/emergent results were called by telephone at the time of interpretation on 07/16/2013 at 4:37 PM to Dr. Aline Brochure , who verbally acknowledged these results. 2. Coronary artery disease. Cardiomegaly. Aortic and mitral valve disease cannot be excluded. 3. Tiny 6 mm nodular density right middle lobe is unchanged. 4. Again noted is diffuse severe metastatic disease to the liver. Pancreatic tail mass is unchanged. Noted on today's exam are multiple nodular densities in the gastrohepatic ligament region consistent with metastatic adenopathy.   Electronically Signed   By: Marcello Moores  Register   On: 07/16/2013 16:39    Scheduled Meds: . enoxaparin (LOVENOX) injection  1 mg/kg Subcutaneous Q12H  . feeding supplement (ENSURE COMPLETE)  237 mL Oral BID BM  . insulin aspart  0-9 Units Subcutaneous TID WC  . metoprolol succinate  50 mg Oral q morning - 10a  . sodium chloride  10-40 mL Intracatheter Q12H  . sodium chloride  3 mL Intravenous Q12H   Continuous Infusions: . sodium chloride 50 mL/hr at 07/18/13 1572    Active Problems:   Malignant melanoma   CAD (coronary artery disease)   Type 2 diabetes mellitus with vascular disease   Pancreatic cancer metastasized to liver   DVT (deep venous thrombosis)   Acute pulmonary embolism   Protein-calorie malnutrition, severe    Time spent: 30 min    Kamilo Och, [facility] Pager [phone]. If 7PM-7AM, please contact night-coverage at [email or web address], password  [facility] 07/18/2013, 12:55 PM  LOS: 2 days

## 2013-07-18 NOTE — Progress Notes (Signed)
Pt continued to be forgetful at times throughout the night. Pt repeatedly reminded to use the call button when getting out of bed, even though discouraged since he has an order for bedrest. Pt continued to forget to use the button and would attempt to get out of bed on his own, setting off the bed alarm.Will continue to monitor pt closely. Carnella [place] I

## 2013-07-18 NOTE — Progress Notes (Signed)
BP referral made.  SNF search initiated.  Bernita Raisin, [place] [facility] [phone]

## 2013-07-18 NOTE — Progress Notes (Signed)
Clinical Social Work Department BRIEF PSYCHOSOCIAL ASSESSMENT 07/18/2013  Patient:  Danny Waters, Danny Waters     Account Number:  [id number]     Admit date:  07/16/2013  Clinical Social Worker:  Levie Heritage  Date/Time:  07/18/2013 02:05 PM  Referred by:  Physician  Date Referred:  07/18/2013 Referred for  SNF Placement   Other Referral:   Interview type:  Other - See comment Other interview type:   Pt's son via phone    PSYCHOSOCIAL DATA Living Status:  ALONE Admitted from facility:   Level of care:   Primary support name:  Ernestine Mcmurray. Primary support relationship to patient:  CHILD, ADULT Degree of support available:   strong    CURRENT CONCERNS Current Concerns  Post-Acute Placement   Other Concerns:    SOCIAL WORK ASSESSMENT / PLAN Spoke with Pt's son via phone re: d/c plans.    Pt's son would prefer that Pt d/c to [facility].  He stated that his mom was in the [facility] and that he was pleased with the type and quality of care that she received in that type setting.    Mr. Ground stated that he would also be willing to explore SNF as a back-up plan, as he understands that [facility] may be full or they may feel that he's not appropriate for residential Hospice, at this point.    CSW to leave a Residential Hospice facility list, as well as a SNF list, for Pt's son in Pt's room.    CSW thanked Pt's son for his time.   Assessment/plan status:  Psychosocial Support/Ongoing Assessment of Needs Other assessment/ plan:   Information/referral to community resources:   SNF and residential Hospice list    PATIENT'S/FAMILY'S RESPONSE TO PLAN OF CARE: Pt's son was calm, cooperative and pleasant.    Pt's son stated, "I'm so glad for this call.  I feel so much better knowing what's going on and what we can do." Pt's son stated that he's coping well with Pt's current medical situation and agrees with MD that Pt cannot return home at d/c.    Pt's son  thanked CSW for time and assistance.   Bernita Raisin, [place] [employer] [phone]

## 2013-07-18 NOTE — Progress Notes (Signed)
Clinical Social Work Department CLINICAL SOCIAL WORK PLACEMENT NOTE 07/18/2013  Patient:  Danny Waters, Danny Waters  Account Number:  [id number] Admit date:  07/16/2013  Clinical Social Worker:  Levie Heritage  Date/time:  07/18/2013 02:10 PM  Clinical Social Work is seeking post-discharge placement for this patient at the following level of care:   SKILLED NURSING   (*CSW will update this form in Epic as items are completed)   07/18/2013  Patient/family provided with [facility] Department of Clinical Social Work's list of facilities offering this level of care within the geographic area requested by the patient (or if unable, by the patient's family).  07/18/2013  Patient/family informed of their freedom to choose among providers that offer the needed level of care, that participate in Medicare, Medicaid or managed care program needed by the patient, have an available bed and are willing to accept the patient.  07/18/2013  Patient/family informed of [facility]' ownership interest in [facility], as well as of the fact that they are under no obligation to receive care at this facility.  PASARR submitted to EDS on 07/18/2013 PASARR number received from EDS on 07/18/2013  FL2 transmitted to all facilities in geographic area requested by pt/family on  07/18/2013 FL2 transmitted to all facilities within larger geographic area on   Patient informed that his/her managed care company has contracts with or will negotiate with  certain facilities, including the following:     Patient/family informed of bed offers received:   Patient chooses bed at  Physician recommends and patient chooses bed at    Patient to be transferred to  on   Patient to be transferred to facility by   The following physician request were entered in Epic:   Additional Comments:  Bernita Raisin, [place] [facility] [phone]

## 2013-07-18 NOTE — Progress Notes (Signed)
Echocardiogram 2D Echocardiogram has been performed.  Danny Waters 07/18/2013, 11:46 AM

## 2013-07-19 NOTE — Progress Notes (Signed)
[facility] PROGRESS NOTE  Danny Waters XLK:[id number] DOB: December 29, 1929 DOA: 07/16/2013 PCP: Florina Ou, MD  Assessment/Plan  Acute hypoxic respiratory failure secondary to RLE DVT and PE, likely related to malignancy.  Area of mobile thrombus in right femoral vein concerning for imminent PE.  Discussed with interventional radiology, however because the patient is hemodynamically stable, recommend against IVC filter placement.  Sinus bradycardia to 40s - CT head:  Neg for obvious mets - Continue lovenox - Wean oxygen as tolerated  - Echocardiogram:  Mildly reduced RV function   Deconditioning and recent fall  - Fall precautions  - PT recommending rolling walker with 5" wheels - Due to memory problems and anticipated quick decline due to progressive cancer, patient not safe to return home  Memory problems, patient needs 24-hour supervision and assistance with medications  - Inpatient hospice vs. SNF with hospice following  Metastatic pancreatic cancer with progression and more mets on last CT ab/pelvis despite - Dr. Julien Nordmann expects quick decline and recommends transfer to inpatient hospice  Type 2 diabetes mellitus with vascular disease but in a setting of 40 pound weight loss  - Hemoglobin A1c 6.2 - Low dose sliding scale insulin  - will likely only need very low dose metformin or no medications at discharge.    Coronary artery disease/HTN/HLD, stable. Liberalize blood pressure goal.  BP and HR okay off beta blocker  Severe aortic stenosis, with mean gradient 52 mmHg and peak gradient 78 mmHg  Severe protein calorie malnutrition evidenced by 40 pound weight loss in the last months  - Liberalize diet, start supplements - Appreciate nutrition recommendations  Normocytic anemia, likely secondary to chemotherapy   Hypokalemia, likely due to poor nutrition, oral potassium repletion  Elevated alk phos likely from liver mets  Diet:  Regular  Access:  Port IVF:   Yes Proph:  lovenox  Code Status:  DNR - discussed with patient and his son.   Family Communication: patient alone today    Disposition Plan:   Recommend inpatient hospice.  If declined, will need SNF with hospice to follow.  SW following   Consultants:  Dr. Julien Nordmann  Procedures:  CT abd/pelvis  Antibiotics:  none   HPI/Subjective:  States he feels well.  Denies SOB, right leg still swollen  Objective: Filed Vitals:   07/18/13 0751 07/18/13 1429 07/18/13 2037 07/19/13 0555  BP:  136/55 143/76 143/84  Pulse:  62 77 76  Temp:  97.8 F (36.6 C) 97.7 F (36.5 C) 98.3 F (36.8 C)  TempSrc:  Oral Oral Oral  Resp:  _0 Height:      Weight:    72.2 kg (159 lb 2.8 oz)  SpO2: 95% 94% 98% 94%    Intake/Output Summary (Last 24 hours) at 07/19/13 1359 Last data filed at 07/19/13 0830  Gross per 24 hour  Intake    690 ml  Output    850 ml  Net   -160 ml   Filed Weights   07/17/13 0401 07/18/13 0439 07/19/13 0555  Weight: 71.1 kg (156 lb 12 oz) 72.6 kg (160 lb 0.9 oz) 72.2 kg (159 lb 2.8 oz)    Exam:   General:  Cachectic CM, No acute distress, sitting up eating lunch  HEENT:  NCAT, MMM  Cardiovascular:  RRR, nl S1, S2 no mrg, 2+ pulses, warm extremities  Respiratory:  CTAB, no increased WOB  Abdomen:   NABS, soft, NT/ND  MSK:   Normal tone and bulk, 2+ RLE  and 1+ LLE edema. Right shin/calf jess mildly pink and warm to touch.   Neuro:  Grossly intact  Data Reviewed: Basic Metabolic Panel:  Recent Labs Lab 07/14/13 1307 07/16/13 1338 07/17/13 0403  NA 139 144 145  K 3.2* 3.6* 4.0  CL  --  106 105  CO2 _0 GLUCOSE 104 126* 110*  BUN 9._1 CREATININE 0.7 0.92 0.73  CALCIUM 9.3 9.4 9.8   Liver Function Tests:  Recent Labs Lab 07/14/13 1307 07/16/13 1338  AST 26 22  ALT 23 20  ALKPHOS 500* 437*  BILITOT 1.34* 1.1  PROT 5.8* 5.5*  ALBUMIN 2.8* 2.6*   No results found for this basename: LIPASE, AMYLASE,  in the last 168  hours No results found for this basename: AMMONIA,  in the last 168 hours CBC:  Recent Labs Lab 07/14/13 1307 07/16/13 1338 07/17/13 0403 07/18/13 0506  WBC 6.1 7.2 8.6 6.5  NEUTROABS 4.2  --   --   --   HGB 11.1* 10.6* 12.7* 10.2*  HCT 33.7* 32.3* 39.4 31.9*  MCV 98.9* 99.4 99.7 100.0  PLT 135* 161 205 159   Cardiac Enzymes: No results found for this basename: CKTOTAL, CKMB, CKMBINDEX, TROPONINI,  in the last 168 hours BNP (last 3 results)  Recent Labs  07/16/13 1338  PROBNP 496.5*   CBG:  Recent Labs Lab 07/17/13 1118 07/17/13 1719 07/17/13 2136 07/18/13 0742 07/18/13 1143  GLUCAP 199* 123* 136* 92 118*    No results found for this or any previous visit (from the past 240 hour(s)).   Studies: No results found.  Scheduled Meds: . enoxaparin (LOVENOX) injection  1 mg/kg Subcutaneous Q12H  . feeding supplement (ENSURE COMPLETE)  237 mL Oral BID BM  . sodium chloride  10-40 mL Intracatheter Q12H  . sodium chloride  3 mL Intravenous Q12H   Continuous Infusions:    Active Problems:   Malignant melanoma   CAD (coronary artery disease)   Type 2 diabetes mellitus with vascular disease   Pancreatic cancer metastasized to liver   DVT (deep venous thrombosis)   Acute pulmonary embolism   Protein-calorie malnutrition, severe    Time spent: 30 min    Legion Discher, [place] [facility] Pager [phone]. If 7PM-7AM, please contact night-coverage at [email or web address], password [facility] 07/19/2013, 1:59 PM  LOS: 3 days

## 2013-07-19 NOTE — Evaluation (Signed)
Occupational Therapy Evaluation Patient Details Name: Danny Waters MRN: [id number] DOB: 01-Apr-1930 Today's Date: 07/19/2013    History of Present Illness Acute hypoxic respiratory failure secondary to RLE DVT and PE, likely related to malignancy. Pt with baseline dementia.   Clinical Impression   Pt is functioning at a supervision to min assist level in ADL and ADL mobility.  He will require 24 hour supervision upon discharge.  Will defer further OT to SNF.    Follow Up Recommendations  SNF;Supervision/Assistance - 24 hour    Equipment Recommendations  None recommended by OT    Recommendations for Other Services       Precautions / Restrictions Precautions Precautions: Fall Precaution Comments: O2 sats      Mobility Bed Mobility Overal bed mobility:  (pt up in chair)                Transfers Overall transfer level: Needs assistance Equipment used: Rolling walker (2 wheeled) Transfers: Sit to/from Stand Sit to Stand: Supervision              Balance     Sitting balance-Leahy Scale: Good Sitting balance - Comments: during functional activity-donning socks   Standing balance support: No upper extremity supported Standing balance-Leahy Scale: Good                      ADL Eating/Feeding: Independent;Sitting Grooming: Wash/dry hands;Wash/dry face;Supervision/safety;Standing   Upper Body Dressing : Supervision/safety;Sitting Lower Body Bathing: Supervison/ safety;Sit to/from stand   Toilet Transfer: Minimal assistance;Ambulation;Regular Theatre manager and Hygiene: Sit to/from stand;Supervision/safety   Functional mobility during ADLs: Minimal assistance;Rolling walker       Vision                     Perception     Praxis      Pertinent Vitals/Pain No pain, VSS     Hand Dominance Right   Extremity/Trunk Assessment Upper Extremity Assessment Upper Extremity Assessment: Overall WFL for tasks  assessed   Lower Extremity Assessment Lower Extremity Assessment: Defer to PT evaluation   Cervical / Trunk Assessment Cervical / Trunk Assessment: Normal   Communication Communication Communication: No difficulties   Cognition Arousal/Alertness: Awake/alert Behavior During Therapy: WFL for tasks assessed/performed Overall Cognitive Status: History of cognitive impairments - at baseline       Memory: Decreased short-term memory;Decreased recall of precautions             General Comments       Exercises      Home Living Family/patient expects to be discharged to:: Skilled nursing facility Living Arrangements: Alone                                      Prior Functioning/Environment Level of Independence: Needs assistance        Comments: pt's son assisted with errands, meds, supervised stove use    OT Diagnosis:     OT Problem List: Impaired balance (sitting and/or standing);Decreased cognition;Decreased safety awareness;Decreased knowledge of use of DME or AE;Decreased knowledge of precautions   OT Treatment/Interventions:      OT Goals(Current goals can be found in the care plan section) Acute Rehab OT Goals Patient Stated Goal: to get back to his walking 1x per wk  OT Frequency:     Barriers to D/C:  End of Session:    Activity Tolerance: Patient tolerated treatment well Patient left: in chair   Time: 9147-8295 OT Time Calculation (min): 33 min Charges:  OT General Charges $OT Visit: 1 Procedure OT Evaluation $Initial OT Evaluation Tier I: 1 Procedure OT Treatments $Self Care/Home Management : 8-22 mins G-Codes:    Malka So 07/19/2013, 11:29 AM [phone]

## 2013-07-20 DIAGNOSIS — I10 Essential (primary) hypertension: Secondary | ICD-10-CM

## 2013-07-20 DIAGNOSIS — C787 Secondary malignant neoplasm of liver and intrahepatic bile duct: Secondary | ICD-10-CM

## 2013-07-20 DIAGNOSIS — C259 Malignant neoplasm of pancreas, unspecified: Secondary | ICD-10-CM

## 2013-07-20 DIAGNOSIS — I824Y9 Acute embolism and thrombosis of unspecified deep veins of unspecified proximal lower extremity: Secondary | ICD-10-CM

## 2013-07-20 DIAGNOSIS — Z7901 Long term (current) use of anticoagulants: Secondary | ICD-10-CM

## 2013-07-20 MED ORDER — ENOXAPARIN SODIUM 80 MG/0.8ML ~~LOC~~ SOLN: 1.0000 mg/kg | Freq: Two times a day (BID) | SUBCUTANEOUS | Status: DC

## 2013-07-20 MED ORDER — HEPARIN SOD (PORK) LOCK FLUSH 100 UNIT/ML IV SOLN
500.0000 [IU] | INTRAVENOUS | Status: AC | PRN
  Administered 2013-07-20: 500 [IU]

## 2013-07-20 MED ORDER — LORAZEPAM 0.5 MG PO TABS: 0.5000 mg | ORAL_TABLET | ORAL | Status: DC | PRN

## 2013-07-20 MED ORDER — MORPHINE SULFATE 15 MG PO TABS: 15.0000 mg | ORAL_TABLET | ORAL | Status: DC | PRN

## 2013-07-20 MED ORDER — ENSURE COMPLETE PO LIQD: 237.0000 mL | Freq: Two times a day (BID) | ORAL | Status: DC

## 2013-07-20 NOTE — Progress Notes (Signed)
Patient discharged to [facility] via ambulance, family at the bedside during discharge. Discharge packet prepared by CSW and given to EMT transporters for the the facility. Report given to Vernie Ammons at [facility].  No open skin but redness on buttocks. Patient alert, denies any pain/distress.

## 2013-07-20 NOTE — Progress Notes (Signed)
DIAGNOSIS:  1) Metastatic pancreatic adenocarcinoma presented with large mass in the care of the pancreas was metastatic liver lesions diagnosed in December of 2014.  2) Stage IIc (T4 B., NX, MX) malignant melanoma of the left arm diagnosed in February of 2013   PRIOR THERAPY:  1) Status post wide excision of the malignant melanoma from the left upper arm as well as a melanoma in situ from the left back and right shoulder under the care of Dr. Ninfa Linden.  2) Systemic chemotherapy with gemcitabine 800 mg/M2 and Abraxane 90 mg/M2 on days, 1, 8, and 15 every 4 weeks, status post 3 cycles discontinued secondary to disease progression.  CURRENT THERAPY: None  Subjective: The patient is seen and examined today. He is feeling fine today with no specific complaints except for increasing fatigue and weakness. He completed 3 cycles of systemic chemotherapy with gemcitabine and Abraxane but unfortunately his recent CT scan of the chest, abdomen and pelvis showed evidence for disease progression. He was admitted with shortness of breath and swelling of the lower extremities. Lower extremity venous duplex showed findings consistent with deep venous thrombosis involving the right common femoral vein, right femoral vein, right popliteal vein, right posterior tibial vein and right peroneal vein. CT angiogram of the chest showed prominent pulmonary emboli in the right main and segment of the pulmonary arteries with questionable right ventricular strain. The patient was started on treatment with Lovenox.   Objective: Vital signs in last 24 hours: Temp:  [97.8 F (36.6 C)-98.4 F (36.9 C)] 98.3 F (36.8 C) (03/30 0419) Pulse Rate:  [77-86] 86 (03/30 0419) Resp:  [17-18] 18 (03/30 0419) BP: (115-137)/(66-76) 137/76 mmHg (03/30 0419) SpO2:  [93 %-99 %] 93 % (03/30 0419) Weight:  [159 lb 6.3 oz (72.3 kg)] 159 lb 6.3 oz (72.3 kg) (03/30 0419)  Intake/Output from previous day: 03/29 0701 - 03/30 0700 In: 480  [P.O.:480] Out: 425 [Urine:425] Intake/Output this shift:    General appearance: alert, cooperative, fatigued and no distress Resp: clear to auscultation bilaterally Cardio: regular rate and rhythm, S1, S2 normal, no murmur, click, rub or gallop GI: soft, non-tender; bowel sounds normal; no masses,  no organomegaly Extremities: extremities normal, atraumatic, no cyanosis or edema  Lab Results:   Recent Labs  07/18/13 0506  WBC 6.5  HGB 10.2*  HCT 31.9*  PLT 159   BMET No results found for this basename: NA, K, CL, CO2, GLUCOSE, BUN, CREATININE, CALCIUM,  in the last 72 hours  Studies/Results: No results found.  Medications: I have reviewed the patient's current medications.  CODE STATUS: No CODE BLUE  Assessment/Plan: 1) metastatic pancreatic adenocarcinoma status post 3 cycles of systemic chemotherapy with gemcitabine and Abraxane but unfortunately has evidence for disease progression. I do think the patient would be a good candidate for any further systemic chemotherapy at this point especially with his deteriorating performance status and lack of good options. I think the patient would be a good candidate for palliative care and hospice referral. He may also benefit from being transferred to the [facility] hospice facility. I discussed my recommendation with the patient and he is in agreement with the current plan.  2) deep venous thrombosis and pulmonary embolism: Continue current treatment with Lovenox.  Thank you so much for taking good care of Mr. Deemer. Please call if you have any questions.   LOS: 4 days    Merari Pion K. 07/20/2013

## 2013-07-20 NOTE — Discharge Summary (Signed)
Physician Discharge Summary  Danny Waters XKG:[id number] DOB: [birth year] DOA: 07/16/2013  PCP: Florina Ou, MD  Admit date: 07/16/2013 Discharge date: 07/20/2013  Recommendations for Outpatient Follow-up:  1. Transfer to [facility] 2. Followup with Doctor Julien Nordmann in one to 2 weeks as needed  Discharge Diagnoses:  Principal Problem:   Acute pulmonary embolism Active Problems:   Malignant melanoma   CAD (coronary artery disease)   Type 2 diabetes mellitus with vascular disease   Pancreatic cancer metastasized to liver   DVT (deep venous thrombosis)   Protein-calorie malnutrition, severe   Discharge Condition: Stable, improved  Diet recommendation: Regular  Wt Readings from Last 3 Encounters:  07/20/13 72.3 kg (159 lb 6.3 oz)  06/30/13 73.573 kg (162 lb 3.2 oz)  06/02/13 77.883 kg (171 lb 11.2 oz)    History of present illness:  The patient is a 80 y.o. year-old male with history of metastatic pancreatic cancer, malignant melanoma, CAD s/p stents, HTN/HLD who presents with lower extremity swelling. The patient has been receiving palliative chemotherapy from Dr. Julien Nordmann for his metastatic pancreatic cancer. He noticed some swelling of his right leg a couple of months ago. 2 weeks ago, he developed increasing swelling, redness and warmth of his right leg. For the last week or so he has been somewhat Mashell Sieben of breath, progressively worse over the last 2 days. He denies cough, fevers, wheezing, chest pain, palpitations. His shortness of breath is worse with exertion but he does not notice any change with position. His son describes his breathing as if the patient had recently run a marathon. Because his lower extremity swelling had increased, the family called the oncology clinic who referred the patient to the emergency department.  In the emergency department, his vital signs were stable, he had oxygen saturations of 87% on room air. He was not tachycardic and his blood pressure was  within normal limits. His labs were notable for hemoglobin of 10.6, baseline. Platelets 161. Duplex of the lower extremities demonstrated a large clot burden in the right leg with some mobile thrombus in the right common femoral vein. A CT angiogram the chest demonstrated right pulmonary embolism. Pro-BNP mildly elevated and suggestion of right heart strain on CT. He was started on heparin infusion. I have ordered CT head to rule out brain mets given hx of malignant melanoma.   Hospital Course:  Acute hypoxic respiratory failure secondary to RLE DVT and PE, likely related to malignancy. His vascular duplex demonstrated an extensive acute thrombus of the right lower extremity with an area of mobile thrombus in the common femoral vein.  He was initially started on a heparin infusion and quickly transitioned to Lovenox injections. He was on bedrest for the first 24 hours, and then was able to work with physical therapy. CT scan of the chest demonstrated acute PE, and he had acute hypoxic respiratory failure secondary to this. He should continue anticoagulation for a minimum of 3 months unless he develops evidence of bleeding or other complications from anticoagulation.  Echocardiogram demonstrated mildly reduced right ventricular function.   Deconditioning and recent fall, he was placed on falls precautions. Physical therapy recommended that he use a rolling walker with 5 inch wheels.  Memory problems, patient needs 24-hour supervision and assistance with medications.  CT scan of the head was negative for metastatic cancer. He may have some underlying dementia or chemotherapy brain.  Metastatic pancreatic cancer with progression and more mets on last CT ab/pelvis despite chemotherapy.  Dr. Julien Nordmann expects  quick decline and recommended transfer to inpatient hospice.  Type 2 diabetes mellitus with vascular disease but in a setting of 40-pound weight loss.  Hemoglobin A1c was 6.2. He was given low-dose sliding  scale insulin, but he does not need to resume his diabetic medications at discharge.  Coronary artery disease/HTN/HLD, stable. Liberalize blood pressure goal. BP and HR okay off beta blocker.  Severe aortic stenosis, with mean gradient 52 mmHg and peak gradient 78 mmHg.  Severe protein calorie malnutrition evidenced by 40 pound weight loss in the last months.  Liberalized diet, started supplements, seen by nutrition.    Normocytic anemia, likely secondary to chemotherapy   Hypokalemia, likely due to poor nutrition, oral potassium repletion   Elevated alk phos likely from liver mets   Consultants:  Dr. Julien Nordmann Procedures:  CT abd/pelvis Antibiotics:  none    Discharge Exam: Filed Vitals:   07/20/13 0419  BP: 137/76  Pulse: 86  Temp: 98.3 F (36.8 C)  Resp: 18   Filed Vitals:   07/19/13 0555 07/19/13 1438 07/19/13 2040 07/20/13 0419  BP: 143/84 115/76 120/66 137/76  Pulse: 76 77 79 86  Temp: 98.3 F (36.8 C) 97.8 F (36.6 C) 98.4 F (36.9 C) 98.3 F (36.8 C)  TempSrc: Oral Oral Oral Oral  Resp: _0 Height:      Weight: 72.2 kg (159 lb 2.8 oz)   72.3 kg (159 lb 6.3 oz)  SpO2: 94% 98% 99% 93%   General: Cachectic CM, No acute distress, sitting up eating breakfast HEENT: NCAT, MMM  Cardiovascular: RRR, nl S1, S2 no mrg, 2+ pulses, warm extremities  Respiratory: CTAB, no increased WOB  Abdomen: NABS, soft, NT/ND  MSK: Normal tone and bulk, 2+ RLE and 1+ LLE edema. Right shin/calf jess mildly pink and warm to touch.  Neuro: Grossly intact   Discharge Instructions      Discharge Orders   Future Appointments Provider Department Dept Phone   07/21/2013 10:45 AM [facility] [phone]   07/21/2013 11:15 AM Curt Bears, MD [facility] [phone]   07/21/2013 12:15 PM [facility] [phone]   07/28/2013 11:00 AM [facility] [phone]   07/28/2013 11:45 AM [facility] [phone]   08/04/2013 11:00 AM [facility] [phone]   08/04/2013 11:30 AM [facility] [phone]   Future Orders Complete By Expires   Call MD for:  difficulty breathing, headache or visual disturbances  As directed    Call MD for:  extreme fatigue  As directed    Call MD for:  hives  As directed    Call MD for:  persistant dizziness or light-headedness  As directed    Call MD for:  persistant nausea and vomiting  As directed    Call MD for:  severe uncontrolled pain  As directed    Call MD for:  temperature >100.4  As directed    Diet general  As directed    Driving Restrictions  As directed    Comments:     No driving or operating heavy machinery   Increase activity slowly  As directed        Medication List    STOP taking these medications       amLODipine 5 MG  tablet  Commonly known as:  NORVASC     insulin glargine 100 UNIT/ML injection  Commonly known as:  LANTUS     metFORMIN 500 MG tablet  Commonly known as:  GLUCOPHAGE     metoprolol succinate 100 MG 24 hr tablet  Commonly known as:  TOPROL-XL     PRESCRIPTION MEDICATION      TAKE these medications       enoxaparin 80 MG/0.8ML injection  Commonly known as:  LOVENOX  Inject 0.7 mLs (70 mg total) into the skin every 12 (twelve) hours.     feeding supplement (ENSURE COMPLETE) Liqd  Take 237 mLs by mouth 2 (two) times daily between meals.     lidocaine-prilocaine cream  Commonly known as:  EMLA  Apply 1 application topically as needed. apply to port 1 hour before chemo appt.     LORazepam 0.5 MG tablet  Commonly known as:  ATIVAN  Take 1 tablet (0.5 mg total) by mouth every 4 (four) hours as needed for anxiety.     morphine 15 MG tablet  Commonly known as:  MSIR  Take  1 tablet (15 mg total) by mouth every 4 (four) hours as needed for severe pain (or shortness of breath).       Follow-up Information   Follow up with Eilleen Kempf., MD. (As needed)    Specialty:  Oncology   Contact information:   [place] [address] [phone]        The results of significant diagnostics from this hospitalization (including imaging, microbiology, ancillary and laboratory) are listed below for reference.    Significant Diagnostic Studies: Ct Abdomen Pelvis W Wo Contrast  07/14/2013   CLINICAL DATA:  Pancreatic cancer in 2015. Melanoma in 2013. Metastatic disease to the liver.  EXAM: CT ABDOMEN AND PELVIS WITHOUT AND WITH CONTRAST  TECHNIQUE: Multidetector CT imaging of the abdomen and pelvis was performed without contrast material in one or both body regions, followed by contrast material(s) and further sections in one or both body regions.  CONTRAST:  165m OMNIPAQUE IOHEXOL 300 MG/ML  SOLN  COMPARISON:  CT ABD/PELVIS W CM dated 03/29/2013; CT ABD/PELVIS W CM dated 06/19/2011; CT CHEST W/CM dated 03/30/2013  FINDINGS: New and enlarging hepatic metastatic lesions are observed. An index lesion at the junction of segments 7 and 8 measures 3.1 cm in AP dimension, previously 2.5 cm on 03/29/2013. An index left hepatic lobe lesion on image 10 of series 4 Measures 3.4 x 2.5 cm, formerly 0.7 x 0.8 cm.  Pancreatic tail mass extending into the splenic hilum and potentially invading the spleen through the splenic hilum Measures 5.2 x 5.8 cm (formerly 5.3 x 4.4 cm) and is associated with mild splenic heterogeneity which may be a manifestation of extrinsic vascular compression. Increased varices along the splenic hilum and adjacent gastric wall. The portal vein remains patent but the splenic vein is attenuated in the vicinity of the pancreatic tail mass.  Mass of the lateral limb left adrenal gland, 2.5 x 1.8 cm, formerly the same by my measurements.  3.6 cm infrarenal  abdominal aortic aneurysm with posterior mural thrombus in its upper portion and anterior mural thrombus in its lower portion, extending to the bifurcation.  Bilateral renal cystic lesions of various size is are once again noted. Some of these appear complex including the 1.2 cm exophytic lesion along the posterior mid kidney on image 52 of series 2 and in the adjacent 5 mm exophytic hyperdense lesion  also along the posterior margin.  Mild generalized wall thickening is present in the colon, and in the sigmoid colon is associated with mesenteric edema and a small amount of fluid adjacent to the colon. Rib in like distal sigmoid colon with extensive wall thickening and longitudinal fold accentuation noted.  Enlarged prostate gland, 5.7 x 4.7 cm.  Thoracolumbar spondylosis.  The celiac trunk, SMA, and IMA appear patent although the IMA origin is narrow.  Punctate density along the posterior wall of the gallbladder, image 38 of series 2, compatible with cholelithiasis.  IMPRESSION: 1. Considerable colonic wall thickening especially in the sigmoid colon, compatible with colitis. Given the patency of the mesenteric vessels and celiac trunk, infectious colitis is favored over ischemic colitis. If the patient has been on antibiotic therapy, then the possibility of early pseudomembranous colitis may be considered. 2. Pancreatic tail mass has enlarged, and there are new and enlarging masses in all segments of the liver, compatible with progressive metastatic disease. 3. Stable small left adrenal mass favoring adrenal metastatic lesion. 4. Infrarenal abdominal aortic aneurysm. 5. Scattered bilateral renal cysts. There is several complex exophytic lesions of the left kidney, probably complex cysts. 6. Increase in varices along the splenic hilum and posterior fundal gastric margin, with attenuation of the splenic vein due to the pancreatic tail mass.   Electronically Signed   By: Sherryl Barters M.D.   On: 07/14/2013 16:27    Dg Chest 2 View  07/16/2013   CLINICAL DATA:  Bilateral lower leg swelling.  EXAM: CHEST  2 VIEW  COMPARISON:  03/28/2013.  Chest CT dated 03/30/2013.  FINDINGS: Interval mildly enlarged cardiac silhouette and right jugular porta catheter. Clear lungs. Thoracic spine degenerative changes and mild scoliosis.  IMPRESSION: No acute abnormality.  Interval mild cardiomegaly.   Electronically Signed   By: Enrique Sack M.D.   On: 07/16/2013 14:33   Ct Head Wo Contrast  07/16/2013   CLINICAL DATA:  Metastatic melanoma and pancreatic cancer. Chest CTA performed earlier today.  EXAM: CT HEAD WITHOUT CONTRAST  TECHNIQUE: Contiguous axial images were obtained from the base of the skull through the vertex without intravenous contrast.  COMPARISON:  03/29/2013.  FINDINGS: No significant change in mild diffuse enlargement of the ventricles and subarachnoid spaces. Mild patchy white matter low density is again demonstrated in both cerebral hemispheres. No intracranial hemorrhage, mass lesion or CT evidence of acute infarction. Mild diffuse ectasia of the basilar artery with a small amount of atheromatous calcification is again demonstrated. Unremarkable bones and included paranasal sinuses.  IMPRESSION: 1. No acute abnormality. A non contrast head CT does not exclude the possibility of metastatic disease. This could be better determined with a brain MR without and with contrast if clinically indicated. 2. Stable atrophy and chronic small vessel white matter ischemic changes.   Electronically Signed   By: Enrique Sack M.D.   On: 07/16/2013 20:01   Ct Angio Chest Pe W/cm &/or Wo Cm  07/16/2013   CLINICAL DATA:  Swollen legs.  History pancreatic cancer.  EXAM: CT ANGIOGRAPHY CHEST WITH CONTRAST  TECHNIQUE: Multidetector CT imaging of the chest was performed using the standard protocol during bolus administration of intravenous contrast. Multiplanar CT image reconstructions and MIPs were obtained to evaluate the vascular  anatomy.  CONTRAST:  132m OMNIPAQUE IOHEXOL 350 MG/ML SOLN  COMPARISON:  DG CHEST 2 VIEW dated 07/16/2013; CT CHEST W/CM dated 03/30/2013  FINDINGS: Thoracic aortic ectasia and atherosclerotic vascular calcification. No aortic dissection. Prominent pulmonary emboli in  of the right main and segmental pulmonary arteries. Right ventricular left ventricular ratio is greater than 1.0.This suggests possibility of right ventricular strain (intermediate risk sub massive pulmonary embolus).  Shotty mediastinal lymph nodes are present. Cardiomegaly with coronary artery disease present. Aortic and mitral valve disease cannot be excluded.  Large airways are patent. Tiny 6 mm nodular density right middle lobe is again noted. No other focal pulmonary lesion identified. Mild basilar atelectasis noted.  Again noted is diffuse severe metastatic disease to the liver. Pancreatic tail mass is again noted and unchanged. Innumerable nodular densities are noted in the region of the gastrohepatic ligament most likely metastatic lymph nodes.  Thyroid unremarkable. Port-A-Cath noted with its tip in right atrium. No significant axillary adenopathy. Chest wall is intact. Degenerative changes thoracic spine  Review of the MIP images confirms the above findings.  IMPRESSION: 1. Prominent pulmonary emboli in the right main and segmental pulmonary arteries. Right ventricular to left ventricular ratio is greater than 1.0 suggesting right ventricular strain. Critical Value/emergent results were called by telephone at the time of interpretation on 07/16/2013 at 4:37 PM to Dr. Aline Brochure , who verbally acknowledged these results. 2. Coronary artery disease. Cardiomegaly. Aortic and mitral valve disease cannot be excluded. 3. Tiny 6 mm nodular density right middle lobe is unchanged. 4. Again noted is diffuse severe metastatic disease to the liver. Pancreatic tail mass is unchanged. Noted on today's exam are multiple nodular densities in the gastrohepatic  ligament region consistent with metastatic adenopathy.   Electronically Signed   By: Marcello Moores  Register   On: 07/16/2013 16:39    Microbiology: No results found for this or any previous visit (from the past 240 hour(s)).   Labs: Basic Metabolic Panel:  Recent Labs Lab 07/14/13 1307 07/16/13 1338 07/17/13 0403  NA 139 144 145  K 3.2* 3.6* 4.0  CL  --  106 105  CO2 _0 GLUCOSE 104 126* 110*  BUN 9._1 CREATININE 0.7 0.92 0.73  CALCIUM 9.3 9.4 9.8   Liver Function Tests:  Recent Labs Lab 07/14/13 1307 07/16/13 1338  AST 26 22  ALT 23 20  ALKPHOS 500* 437*  BILITOT 1.34* 1.1  PROT 5.8* 5.5*  ALBUMIN 2.8* 2.6*   No results found for this basename: LIPASE, AMYLASE,  in the last 168 hours No results found for this basename: AMMONIA,  in the last 168 hours CBC:  Recent Labs Lab 07/14/13 1307 07/16/13 1338 07/17/13 0403 07/18/13 0506  WBC 6.1 7.2 8.6 6.5  NEUTROABS 4.2  --   --   --   HGB 11.1* 10.6* 12.7* 10.2*  HCT 33.7* 32.3* 39.4 31.9*  MCV 98.9* 99.4 99.7 100.0  PLT 135* 161 205 159   Cardiac Enzymes: No results found for this basename: CKTOTAL, CKMB, CKMBINDEX, TROPONINI,  in the last 168 hours BNP: BNP (last 3 results)  Recent Labs  07/16/13 1338  PROBNP 496.5*   CBG:  Recent Labs Lab 07/17/13 1118 07/17/13 1719 07/17/13 2136 07/18/13 0742 07/18/13 1143  GLUCAP 199* 123* 136* 92 118*    Time coordinating discharge: 45 minutes  Signed:  Consuello Lassalle  [facility] 07/20/2013, 10:11 AM

## 2013-07-20 NOTE — Plan of Care (Signed)
Problem: Discharge Progression Outcomes Goal: Hemodynamically stable Outcome: Not Met (add Reason) Hospice care.

## 2013-07-20 NOTE — Progress Notes (Signed)
Patient is set to discharge to [facility] - residential hospice facility today. Patient & son, Karlo at bedside & aware. Discharge packet in [place], [place] aware. [facility]AR scheduled for 11am pickup (Service Request Id: [phone]).   Raynaldo Opitz, [facility] Clinical Social Worker cell #: [phone]

## 2013-07-20 NOTE — Consult Note (Signed)
[place] Liaison: Received request from weekend [place] for family interest in [place] 07/19/2013. Chart reviewed. [employer] available today. Patient and son agreeable. Son coming in to complete paperwork at 9:30. Have made MD aware. Dr. Tomasa Hosteller to assume care per family request. Will need DC sum faxed to [phone] and RN to call report to [phone]. Number given to RN. Please arrange transport for Danny Waters to arrive at [facility] by noon. Thank you. Erling Conte LCsW [phone]

## 2013-07-21 ENCOUNTER — Ambulatory Visit: Payer: Medicare Other

## 2013-07-21 ENCOUNTER — Ambulatory Visit: Payer: Medicare Other | Admitting: Internal Medicine

## 2013-07-21 ENCOUNTER — Other Ambulatory Visit: Payer: Medicare Other

## 2013-07-28 ENCOUNTER — Other Ambulatory Visit: Payer: Medicare Other

## 2013-07-28 ENCOUNTER — Other Ambulatory Visit: Payer: Self-pay

## 2013-07-28 ENCOUNTER — Ambulatory Visit: Payer: Medicare Other

## 2013-08-04 ENCOUNTER — Ambulatory Visit: Payer: Medicare Other

## 2013-08-04 ENCOUNTER — Other Ambulatory Visit: Payer: Medicare Other

## 2013-08-05 ENCOUNTER — Telehealth: Payer: Self-pay | Admitting: Dietician

## 2013-08-05 NOTE — Telephone Encounter (Signed)
Patient under hospice care.

## 2013-08-19 ENCOUNTER — Inpatient Hospital Stay (HOSPITAL_COMMUNITY)
Admission: EM | Admit: 2013-08-19 | Discharge: 2013-08-22 | DRG: 689 | Disposition: A | Discharge status: Skilled Nursing Facility | Attending: Internal Medicine | Admitting: Internal Medicine

## 2013-08-19 ENCOUNTER — Encounter (HOSPITAL_COMMUNITY): Payer: Self-pay | Admitting: Emergency Medicine

## 2013-08-19 DIAGNOSIS — Z8249 Family history of ischemic heart disease and other diseases of the circulatory system: Secondary | ICD-10-CM

## 2013-08-19 DIAGNOSIS — IMO0002 Reserved for concepts with insufficient information to code with codable children: Secondary | ICD-10-CM

## 2013-08-19 DIAGNOSIS — Z515 Encounter for palliative care: Secondary | ICD-10-CM

## 2013-08-19 DIAGNOSIS — E43 Unspecified severe protein-calorie malnutrition: Secondary | ICD-10-CM | POA: Diagnosis present

## 2013-08-19 DIAGNOSIS — N390 Urinary tract infection, site not specified: Principal | ICD-10-CM | POA: Diagnosis present

## 2013-08-19 DIAGNOSIS — C259 Malignant neoplasm of pancreas, unspecified: Secondary | ICD-10-CM | POA: Diagnosis present

## 2013-08-19 DIAGNOSIS — R5381 Other malaise: Secondary | ICD-10-CM | POA: Diagnosis present

## 2013-08-19 DIAGNOSIS — R531 Weakness: Secondary | ICD-10-CM | POA: Diagnosis present

## 2013-08-19 DIAGNOSIS — Z85820 Personal history of malignant melanoma of skin: Secondary | ICD-10-CM

## 2013-08-19 DIAGNOSIS — Z66 Do not resuscitate: Secondary | ICD-10-CM | POA: Diagnosis not present

## 2013-08-19 DIAGNOSIS — I359 Nonrheumatic aortic valve disorder, unspecified: Secondary | ICD-10-CM | POA: Diagnosis present

## 2013-08-19 DIAGNOSIS — Z7982 Long term (current) use of aspirin: Secondary | ICD-10-CM

## 2013-08-19 DIAGNOSIS — E1159 Type 2 diabetes mellitus with other circulatory complications: Secondary | ICD-10-CM | POA: Diagnosis present

## 2013-08-19 DIAGNOSIS — Z87891 Personal history of nicotine dependence: Secondary | ICD-10-CM

## 2013-08-19 DIAGNOSIS — E785 Hyperlipidemia, unspecified: Secondary | ICD-10-CM | POA: Diagnosis present

## 2013-08-19 DIAGNOSIS — C439 Malignant melanoma of skin, unspecified: Secondary | ICD-10-CM | POA: Diagnosis present

## 2013-08-19 DIAGNOSIS — R627 Adult failure to thrive: Secondary | ICD-10-CM | POA: Diagnosis present

## 2013-08-19 DIAGNOSIS — Z9861 Coronary angioplasty status: Secondary | ICD-10-CM

## 2013-08-19 DIAGNOSIS — I350 Nonrheumatic aortic (valve) stenosis: Secondary | ICD-10-CM | POA: Diagnosis present

## 2013-08-19 DIAGNOSIS — J189 Pneumonia, unspecified organism: Secondary | ICD-10-CM | POA: Diagnosis present

## 2013-08-19 DIAGNOSIS — E119 Type 2 diabetes mellitus without complications: Secondary | ICD-10-CM | POA: Diagnosis present

## 2013-08-19 DIAGNOSIS — I119 Hypertensive heart disease without heart failure: Secondary | ICD-10-CM | POA: Diagnosis present

## 2013-08-19 DIAGNOSIS — Z86711 Personal history of pulmonary embolism: Secondary | ICD-10-CM

## 2013-08-19 DIAGNOSIS — R4182 Altered mental status, unspecified: Secondary | ICD-10-CM | POA: Diagnosis present

## 2013-08-19 DIAGNOSIS — W19XXXA Unspecified fall, initial encounter: Secondary | ICD-10-CM | POA: Diagnosis present

## 2013-08-19 DIAGNOSIS — R5383 Other fatigue: Secondary | ICD-10-CM

## 2013-08-19 DIAGNOSIS — I2510 Atherosclerotic heart disease of native coronary artery without angina pectoris: Secondary | ICD-10-CM | POA: Diagnosis present

## 2013-08-19 DIAGNOSIS — C787 Secondary malignant neoplasm of liver and intrahepatic bile duct: Secondary | ICD-10-CM | POA: Diagnosis present

## 2013-08-19 DIAGNOSIS — M6282 Rhabdomyolysis: Secondary | ICD-10-CM | POA: Diagnosis present

## 2013-08-19 MED ORDER — SODIUM CHLORIDE 0.9 % IV SOLN
Freq: Once | INTRAVENOUS | Status: AC
  Administered 2013-08-19: via INTRAVENOUS

## 2013-08-19 NOTE — ED Notes (Signed)
Dr. Miller at the bedside.  

## 2013-08-19 NOTE — ED Notes (Signed)
PER EMS: pt from home, reports the patient fell approx 10am this morning and family came to see patient about one hour ago and found patient laying on the floor. Pt was conscious but had slight altered mental status, pt didn't remember anything today after this morning around 10am. He remembers waking up on the floor.  EMS reports initial BP is 80/50 and HR of 110. Small abrasion noted to back of patients head. Left eye sclera hemorrhage. Pt has pancreatic cancer. IV 20 LAC, given 400cc NS and last BP- 124/72, HR-90, CBG-185, O2-96%, Temp 100.1.

## 2013-08-19 NOTE — ED Provider Notes (Signed)
CSN: [id number]     Arrival date & time 08/19/13  2217 History   First MD Initiated Contact with Patient 08/19/13 2254     Chief Complaint  Patient presents with  . Fall  . Altered Mental Status     (Consider location/radiation/quality/duration/timing/severity/associated sxs/prior Treatment) HPI Comments: 80 year old male with a history of metastatic pancreatic cancer, history of aortic stenosis hypertension and hyperlipidemia as well as a history of coronary disease status post stenting in 2014. He presents after having a fall at approximately 10:00 this morning. The patient was last seen yesterday by family members, when he did not make a phone call to his girlfriend earlier today a raised suspicion for something wrong which prompted a visit to the house. The patient was found on the floor, unable to get up off the floor, awake, slightly confused (family unable to describe confusion). The patient states that he has pain in the back of his head, his left shoulder and his neck, he also has pain in his lower back. He reports that before this happened he has been having urinary frequency which seems to be a problem for him in a subacute fashion. He has not had any fevers or chills, denies coughing or shortness of breath and has no abdominal pain. Surprisingly with his advanced pancreatic cancer he has minimal daily pain. The patient cannot clearly describe what happened and his fall, he is unsure if it was mechanical work he had a syncopal event. The patient was diagnosed with DVT and pulmonary embolism in March of this year and had been taking Lovenox injections for that. The patient's family states that he only uses aspirin at this time.  Patient is a 80 y.o. male presenting with fall and altered mental status. The history is provided by the patient, a relative, a caregiver, medical records and the EMS personnel.  Fall  Altered Mental Status   Past Medical History  Diagnosis Date  .  Hyperlipidemia     takes Lipitor daily  . Hypertension     takes Amlodipine,Metoprolol,and Losartan daily  . Hx of colonic polyps   . Type 2 diabetes mellitus with vascular disease   . Aortic stenosis     Last ECHO Dr. Wynonia Lawman 09/24/12  AVA 0.76 assymptomatic   . CAD (coronary artery disease) 09/24/2012    Cath 07/04/2002 normal Left main, 50% stenosis proximal LAD, 99% stenosis mid LAD, 50% stenosis mid CFX, 60% stenosis proximal PDA, stent placed mid LAD  Taxus stent to mid LAD 07/04/2002   . Hypertensive heart disease   . Pancreatic cancer     metastatic, tx by Dr. Julien Nordmann  . Malignant melanoma    Past Surgical History  Procedure Laterality Date  . [place]  2003  . Hernia repair  1957  . Appendectomy    . Cardiac catheterization  10+yrs ago  . Coronary stent placement  2004    Taxus stent to mid LAD  . Colonoscopy    . Melanoma excision  06/07/2011    Procedure: MELANOMA EXCISION;  Surgeon: Harl Bowie, MD;  Location: [facility];  Service: General;  Laterality: Right;  wide excision melanoma right shoulder  and left back and left upper arm   . Mastoid debridement     Family History  Problem Relation Age of Onset  . Heart disease Mother   . Hypertension Mother   . Anesthesia problems Neg Hx   . Hypotension Neg Hx   . Malignant hyperthermia Neg Hx   .  Pseudochol deficiency Neg Hx   . Hypertension Father    History  Substance Use Topics  . Smoking status: Former Smoker -- 2.00 packs/day for 10 years    Types: Cigarettes    Quit date: 05/23/1958  . Smokeless tobacco: Never Used  . Alcohol Use: Yes    Review of Systems  All other systems reviewed and are negative.     Allergies  Review of patient's allergies indicates no known allergies.  Home Medications   Prior to Admission medications   Medication Sig Start Date End Date Taking? Authorizing Provider  enoxaparin (LOVENOX) 80 MG/0.8ML injection Inject 0.7 mLs (70 mg total) into the skin every 12  (twelve) hours. 07/20/13   Janece Canterbury, MD  feeding supplement, ENSURE COMPLETE, (ENSURE COMPLETE) LIQD Take 237 mLs by mouth 2 (two) times daily between meals. 07/20/13   Janece Canterbury, MD  lidocaine-prilocaine (EMLA) cream Apply 1 application topically as needed. apply to port 1 hour before chemo appt. 04/13/13   Curt Bears, MD  LORazepam (ATIVAN) 0.5 MG tablet Take 1 tablet (0.5 mg total) by mouth every 4 (four) hours as needed for anxiety. 07/20/13   Janece Canterbury, MD  morphine (MSIR) 15 MG tablet Take 1 tablet (15 mg total) by mouth every 4 (four) hours as needed for severe pain (or shortness of breath). 07/20/13   Janece Canterbury, MD   BP 120/74  Pulse 99  Temp(Src) 100.2 F (37.9 C) (Rectal)  Resp 23  Ht 5' 8.5" (1.74 m)  Wt 176 lb (79.833 kg)  BMI 26.37 kg/m2  SpO2 92% Physical Exam  Nursing note and vitals reviewed. Constitutional: He appears well-developed and well-nourished. No distress.  HENT:  Head: Normocephalic.  Mouth/Throat: Oropharynx is clear and moist. No oropharyngeal exudate.  Slight abrasion to the posterior occiput, serous drainage on the pillow, no laceration, no hematoma  Eyes: EOM are normal. Pupils are equal, round, and reactive to light. Right eye exhibits no discharge. Left eye exhibits no discharge. No scleral icterus.  Bilateral slightly erythematous conjunctiva, mild discharge bilaterally, crusting in the periorbital area, normal pupillary exam  Neck: Normal range of motion. Neck supple. No JVD present. No thyromegaly present.  Cardiovascular: Regular rhythm and intact distal pulses.  Exam reveals no gallop and no friction rub.   Murmur heard. Tachycardia to 110, occasional ectopy  Pulmonary/Chest: Effort normal. No respiratory distress. He has no wheezes. He has rales ( Soft rales at the base).  No increased work of breathing, speaks in full sentences  Abdominal: Soft. Bowel sounds are normal. He exhibits no distension and no mass. There is no  tenderness.  Musculoskeletal: Normal range of motion. He exhibits tenderness ( Tender to palpation over the lumbar spine). He exhibits no edema.  Able to straight-leg raise bilaterally, no pain with rotation of the hips knees or ankles bilaterally, able to raise both arms but notes pain in the left shoulder with raising arms. Mild tenderness around the left shoulder girdle, no obvious deformity  Lymphadenopathy:    He has no cervical adenopathy.  Neurological: He is alert. Coordination normal.  Speech is clear, mentation is normal, moves all extremities x4, no difficulty with coordination, memory appears intact other than the events surrounding his fall  Skin: Skin is warm and dry. No rash noted. No erythema.  Psychiatric: He has a normal mood and affect. His behavior is normal.    ED Course  Procedures (including critical care time) Labs Review Labs Reviewed  CBC WITH DIFFERENTIAL - Abnormal;  Notable for the following:    WBC 18.9 (*)    RBC 4.18 (*)    RDW 15.6 (*)    Neutrophils Relative % 91 (*)    Neutro Abs 17.1 (*)    Lymphocytes Relative 4 (*)    All other components within normal limits  URINALYSIS, ROUTINE W REFLEX MICROSCOPIC - Abnormal; Notable for the following:    Color, Urine ORANGE (*)    APPearance CLOUDY (*)    Hgb urine dipstick MODERATE (*)    Bilirubin Urine LARGE (*)    Ketones, ur 15 (*)    Protein, ur 30 (*)    Urobilinogen, UA 2.0 (*)    Nitrite POSITIVE (*)    Leukocytes, UA SMALL (*)    All other components within normal limits  HEPATIC FUNCTION PANEL - Abnormal; Notable for the following:    Albumin 2.7 (*)    AST 39 (*)    Alkaline Phosphatase 617 (*)    Total Bilirubin 3.9 (*)    Bilirubin, Direct 2.6 (*)    Indirect Bilirubin 1.3 (*)    All other components within normal limits  CK - Abnormal; Notable for the following:    Total CK 456 (*)    All other components within normal limits  URINE MICROSCOPIC-ADD ON - Abnormal; Notable for the  following:    Bacteria, UA FEW (*)    All other components within normal limits  URINE CULTURE  I-STAT CHEM 8, ED  I-STAT CG4 LACTIC ACID, ED  Randolm Idol, ED    Imaging Review Dg Lumbar Spine Complete  08/20/2013   CLINICAL DATA:  Status post fall.  Lower back pain.  EXAM: LUMBAR SPINE - COMPLETE 4+ VIEW  COMPARISON:  Abdominal radiograph performed 06/25/2012, and CT of the abdomen from 07/14/2013  FINDINGS: There is no evidence of fracture or subluxation. Vertebral bodies demonstrate normal height and alignment. There is minimal narrowing of the intervertebral disc space at L4-L5. Mild facet disease is noted at the lower lumbar spine. Slight anterior wedging of vertebral body T12 is likely developmental in nature.  The visualized bowel gas pattern is unremarkable in appearance; air and stool are noted within the colon. The sacroiliac joints are within normal limits. Scattered calcification is seen along the abdominal aorta and its branches.  IMPRESSION: 1. No evidence of acute fracture or subluxation along the lumbar spine. 2. Minimal degenerative change at the lower lumbar spine. 3. Scattered vascular calcifications seen.   Electronically Signed   By: Garald Balding M.D.   On: 08/20/2013 00:26   Ct Head Wo Contrast  08/20/2013   CLINICAL DATA:  Status post fall. Patient found on floor after 13 hours. Altered mental status. Left-sided scleral hemorrhage and small abrasion at the back of the head. Concern for cervical spine injury.  EXAM: CT HEAD WITHOUT CONTRAST  CT CERVICAL SPINE WITHOUT CONTRAST  TECHNIQUE: Multidetector CT imaging of the head and cervical spine was performed following the standard protocol without intravenous contrast. Multiplanar CT image reconstructions of the cervical spine were also generated.  COMPARISON:  CT of the head performed 07/16/2013  FINDINGS: CT HEAD FINDINGS  There is no evidence of acute infarction, mass lesion, or intra- or extra-axial hemorrhage on CT.  The  posterior fossa, including the cerebellum, brainstem and fourth ventricle, is within normal limits. The third and lateral ventricles, and basal ganglia are unremarkable in appearance. The cerebral hemispheres are symmetric in appearance, with normal gray-white differentiation. No mass effect or midline  shift is seen.  There is no evidence of fracture; visualized osseous structures are unremarkable in appearance. The visualized portions of the orbits are within normal limits. The paranasal sinuses and mastoid air cells are well-aerated. Mild soft tissue swelling is noted overlying the parietal calvarium bilaterally.  CT CERVICAL SPINE FINDINGS  There is no evidence of fracture or subluxation. Vertebral bodies demonstrate normal height and alignment. Intervertebral disc spaces are preserved. Prevertebral soft tissues are within normal limits. A small posterior disc osteophyte complex is noted at C3-C4.  The visualized portions of the thyroid gland are unremarkable in appearance, aside from a partially characterized focus of calcification at the right thyroid lobe. Minimal scarring is noted at the right lung apex. Calcification is noted along the carotid bifurcations bilaterally, with likely mild luminal narrowing along the proximal right internal carotid artery.  IMPRESSION: 1. No evidence of traumatic intracranial injury or fracture. 2. No evidence of fracture or subluxation along the cervical spine. 3. Mild soft tissue swelling noted overlying the parietal calvarium bilaterally. 4. Minimal scarring at the right lung apex. 5. Calcification along the carotid bifurcations bilaterally, with likely mild luminal narrowing along the proximal right internal carotid artery. Carotid ultrasound would be helpful for further evaluation, when and as deemed clinically appropriate.   Electronically Signed   By: Garald Balding M.D.   On: 08/20/2013 00:49   Ct Cervical Spine Wo Contrast  08/20/2013   CLINICAL DATA:  Status post  fall. Patient found on floor after 13 hours. Altered mental status. Left-sided scleral hemorrhage and small abrasion at the back of the head. Concern for cervical spine injury.  EXAM: CT HEAD WITHOUT CONTRAST  CT CERVICAL SPINE WITHOUT CONTRAST  TECHNIQUE: Multidetector CT imaging of the head and cervical spine was performed following the standard protocol without intravenous contrast. Multiplanar CT image reconstructions of the cervical spine were also generated.  COMPARISON:  CT of the head performed 07/16/2013  FINDINGS: CT HEAD FINDINGS  There is no evidence of acute infarction, mass lesion, or intra- or extra-axial hemorrhage on CT.  The posterior fossa, including the cerebellum, brainstem and fourth ventricle, is within normal limits. The third and lateral ventricles, and basal ganglia are unremarkable in appearance. The cerebral hemispheres are symmetric in appearance, with normal gray-white differentiation. No mass effect or midline shift is seen.  There is no evidence of fracture; visualized osseous structures are unremarkable in appearance. The visualized portions of the orbits are within normal limits. The paranasal sinuses and mastoid air cells are well-aerated. Mild soft tissue swelling is noted overlying the parietal calvarium bilaterally.  CT CERVICAL SPINE FINDINGS  There is no evidence of fracture or subluxation. Vertebral bodies demonstrate normal height and alignment. Intervertebral disc spaces are preserved. Prevertebral soft tissues are within normal limits. A small posterior disc osteophyte complex is noted at C3-C4.  The visualized portions of the thyroid gland are unremarkable in appearance, aside from a partially characterized focus of calcification at the right thyroid lobe. Minimal scarring is noted at the right lung apex. Calcification is noted along the carotid bifurcations bilaterally, with likely mild luminal narrowing along the proximal right internal carotid artery.  IMPRESSION: 1.  No evidence of traumatic intracranial injury or fracture. 2. No evidence of fracture or subluxation along the cervical spine. 3. Mild soft tissue swelling noted overlying the parietal calvarium bilaterally. 4. Minimal scarring at the right lung apex. 5. Calcification along the carotid bifurcations bilaterally, with likely mild luminal narrowing along the proximal right internal carotid  artery. Carotid ultrasound would be helpful for further evaluation, when and as deemed clinically appropriate.   Electronically Signed   By: Garald Balding M.D.   On: 08/20/2013 00:49   Dg Shoulder Left  08/20/2013   CLINICAL DATA:  Status post fall; left shoulder pain.  EXAM: LEFT SHOULDER - 2+ VIEW  COMPARISON:  None.  FINDINGS: There is no evidence of fracture or dislocation. The left humeral head is seated within the glenoid fossa. The acromioclavicular joint is unremarkable in appearance. No significant soft tissue abnormalities are seen. The visualized portions of the left lung are clear.  IMPRESSION: No evidence of fracture or dislocation.   Electronically Signed   By: Garald Balding M.D.   On: 08/20/2013 00:24     EKG Interpretation   Date/Time:  Wednesday August 19 2013 22:27:17 EDT Ventricular Rate:  102 PR Interval:  144 QRS Duration: 83 QT Interval:  367 QTC Calculation: 478 R Axis:   -68 Text Interpretation:  Sinus tachycardia Multiform ventricular premature  complexes Left anterior fascicular block Abnormal R-wave progression, late  transition Probable left ventricular hypertrophy Borderline T  abnormalities, lateral leads Borderline prolonged QT interval since last  tracing no significant change Abnormal ekg Confirmed by Sabra Heck  MD, Avelina Mcclurkin  [phone]) on 08/20/2013 12:21:19 AM      MDM   Final diagnoses:  Weakness  UTI (lower urinary tract infection)  Fall    The patient has had a fall, he has no altered metal status at this time, family at the bedside agrees that his mental status appears to  be baseline right now, he does have a low-grade fever at 100.2, most recent chemotherapy was 3 weeks ago but this seems to have stopped, he also states that he is no longer taking anticoagulants other than aspirin, he is in and out of hospice and it was recommended that he go to assisted living but he refused and still lives at home. Amount of workup is questionable, family concerned about injuries and source of fall, proceed with workup.    His metabolic panel reveals sodium of 142 potassium 5.0 chloride 102, calcium normal CO2 27 glucose 156 BUN 26 creatinine 1.2.  discussed care with the hospitalist who will admit the patient to the hospital for physical therapy, treatment of UTI, generalized weakness and likely placement in a rehabilitation or hospice care facility.      Johnna Acosta, MD 08/20/13 [phone]

## 2013-08-20 ENCOUNTER — Emergency Department (HOSPITAL_COMMUNITY)

## 2013-08-20 ENCOUNTER — Other Ambulatory Visit (HOSPITAL_COMMUNITY): Payer: Medicare Other

## 2013-08-20 ENCOUNTER — Encounter (HOSPITAL_COMMUNITY): Payer: Self-pay | Admitting: *Deleted

## 2013-08-20 DIAGNOSIS — R531 Weakness: Secondary | ICD-10-CM | POA: Diagnosis present

## 2013-08-20 DIAGNOSIS — M6282 Rhabdomyolysis: Secondary | ICD-10-CM | POA: Diagnosis present

## 2013-08-20 DIAGNOSIS — N390 Urinary tract infection, site not specified: Secondary | ICD-10-CM | POA: Diagnosis present

## 2013-08-20 LAB — URINE MICROSCOPIC-ADD ON

## 2013-08-20 LAB — HEPATIC FUNCTION PANEL
ALBUMIN: 2.7 g/dL — AB (ref 3.5–5.2)
ALK PHOS: 617 U/L — AB (ref 39–117)
ALT: 24 U/L (ref 0–53)
AST: 39 U/L — ABNORMAL HIGH (ref 0–37)
BILIRUBIN INDIRECT: 1.3 mg/dL — AB (ref 0.3–0.9)
Bilirubin, Direct: 2.6 mg/dL — ABNORMAL HIGH (ref 0.0–0.3)
TOTAL PROTEIN: 6.3 g/dL (ref 6.0–8.3)
Total Bilirubin: 3.9 mg/dL — ABNORMAL HIGH (ref 0.3–1.2)

## 2013-08-20 LAB — CBC WITH DIFFERENTIAL/PLATELET
BASOS PCT: 0 % (ref 0–1)
Basophils Absolute: 0 10*3/uL (ref 0.0–0.1)
Eosinophils Absolute: 0 10*3/uL (ref 0.0–0.7)
Eosinophils Relative: 0 % (ref 0–5)
HCT: 41.7 % (ref 39.0–52.0)
Hemoglobin: 13.7 g/dL (ref 13.0–17.0)
Lymphocytes Relative: 4 % — ABNORMAL LOW (ref 12–46)
Lymphs Abs: 0.8 10*3/uL (ref 0.7–4.0)
MCH: 32.8 pg (ref 26.0–34.0)
MCHC: 32.9 g/dL (ref 30.0–36.0)
MCV: 99.8 fL (ref 78.0–100.0)
MONOS PCT: 5 % (ref 3–12)
Monocytes Absolute: 1 10*3/uL (ref 0.1–1.0)
NEUTROS PCT: 91 % — AB (ref 43–77)
Neutro Abs: 17.1 10*3/uL — ABNORMAL HIGH (ref 1.7–7.7)
PLATELETS: 222 10*3/uL (ref 150–400)
RBC: 4.18 MIL/uL — ABNORMAL LOW (ref 4.22–5.81)
RDW: 15.6 % — ABNORMAL HIGH (ref 11.5–15.5)
WBC: 18.9 10*3/uL — ABNORMAL HIGH (ref 4.0–10.5)

## 2013-08-20 LAB — CREATININE, SERUM
CREATININE: 0.8 mg/dL (ref 0.50–1.35)
GFR calc Af Amer: >90 mL/min (ref >90)
GFR, EST NON AFRICAN AMERICAN: 82 mL/min — AB (ref >90)

## 2013-08-20 LAB — CBC
HCT: 39.4 % (ref 39.0–52.0)
Hemoglobin: 12.6 g/dL — ABNORMAL LOW (ref 13.0–17.0)
MCH: 32.6 pg (ref 26.0–34.0)
MCHC: 32 g/dL (ref 30.0–36.0)
MCV: 101.8 fL — ABNORMAL HIGH (ref 78.0–100.0)
PLATELETS: 200 10*3/uL (ref 150–400)
RBC: 3.87 MIL/uL — ABNORMAL LOW (ref 4.22–5.81)
RDW: 15.8 % — AB (ref 11.5–15.5)
WBC: 15.4 10*3/uL — ABNORMAL HIGH (ref 4.0–10.5)

## 2013-08-20 LAB — I-STAT TROPONIN, ED: Troponin i, poc: 0.07 ng/mL (ref 0.00–0.08)

## 2013-08-20 LAB — URINALYSIS, ROUTINE W REFLEX MICROSCOPIC
Glucose, UA: NEGATIVE mg/dL
Ketones, ur: 15 mg/dL — AB
Nitrite: POSITIVE — AB
PROTEIN: 30 mg/dL — AB
Specific Gravity, Urine: 1.026 (ref 1.005–1.030)
Urobilinogen, UA: 2 mg/dL — ABNORMAL HIGH (ref 0.0–1.0)
pH: 5.5 (ref 5.0–8.0)

## 2013-08-20 LAB — POCT I-STAT, CHEM 8
BUN: 26 mg/dL — ABNORMAL HIGH (ref 6–23)
CALCIUM ION: 1.24 mmol/L (ref 1.13–1.30)
CREATININE: 1.2 mg/dL (ref 0.50–1.35)
Chloride: 102 mEq/L (ref 96–112)
Glucose, Bld: 156 mg/dL — ABNORMAL HIGH (ref 70–99)
HEMATOCRIT: 43 % (ref 39.0–52.0)
HEMOGLOBIN: 14.6 g/dL (ref 13.0–17.0)
Potassium: 5 mEq/L (ref 3.7–5.3)
Sodium: 142 mEq/L (ref 137–147)
TCO2: 27 mmol/L (ref 0–100)

## 2013-08-20 LAB — CG4 I-STAT (LACTIC ACID): Lactic Acid, Venous: 1.83 mmol/L (ref 0.5–2.2)

## 2013-08-20 LAB — CK: Total CK: 456 U/L — ABNORMAL HIGH (ref 7–232)

## 2013-08-20 MED ORDER — ONDANSETRON HCL 4 MG/2ML IJ SOLN
4.0000 mg | Freq: Four times a day (QID) | INTRAMUSCULAR | Status: DC | PRN
  Filled 2013-08-20: qty 2

## 2013-08-20 MED ORDER — LEVOFLOXACIN IN D5W 750 MG/150ML IV SOLN
750.0000 mg | INTRAVENOUS | Status: DC
  Administered 2013-08-20 – 2013-08-21 (×2): 750 mg via INTRAVENOUS
  Filled 2013-08-20 (×2): qty 150

## 2013-08-20 MED ORDER — ACETAMINOPHEN 325 MG PO TABS
650.0000 mg | ORAL_TABLET | Freq: Four times a day (QID) | ORAL | Status: DC | PRN
  Administered 2013-08-21 (×2): 650 mg via ORAL
  Filled 2013-08-20 (×2): qty 2

## 2013-08-20 MED ORDER — ASPIRIN EC 81 MG PO TBEC
81.0000 mg | DELAYED_RELEASE_TABLET | Freq: Every day | ORAL | Status: DC
  Administered 2013-08-20 – 2013-08-22 (×3): 81 mg via ORAL
  Filled 2013-08-20 (×3): qty 1

## 2013-08-20 MED ORDER — SODIUM CHLORIDE 0.9 % IV SOLN
INTRAVENOUS | Status: DC
  Administered 2013-08-21: 09:00:00 via INTRAVENOUS

## 2013-08-20 MED ORDER — ONDANSETRON HCL 4 MG/2ML IJ SOLN: 4.0000 mg | Freq: Three times a day (TID) | INTRAMUSCULAR | Status: DC | PRN

## 2013-08-20 MED ORDER — ACETAMINOPHEN 650 MG RE SUPP: 650.0000 mg | Freq: Four times a day (QID) | RECTAL | Status: DC | PRN

## 2013-08-20 MED ORDER — SODIUM CHLORIDE 0.9 % IV SOLN
INTRAVENOUS | Status: AC
  Administered 2013-08-20: 07:00:00 via INTRAVENOUS

## 2013-08-20 MED ORDER — ENSURE COMPLETE PO LIQD
237.0000 mL | Freq: Two times a day (BID) | ORAL | Status: DC
  Administered 2013-08-21 – 2013-08-22 (×3): 237 mL via ORAL

## 2013-08-20 MED ORDER — ONDANSETRON HCL 4 MG PO TABS: 4.0000 mg | ORAL_TABLET | Freq: Four times a day (QID) | ORAL | Status: DC | PRN

## 2013-08-20 MED ORDER — DEXTROSE 5 % IV SOLN
1.0000 g | Freq: Once | INTRAVENOUS | Status: AC
  Administered 2013-08-20: 1 g via INTRAVENOUS
  Filled 2013-08-20: qty 10

## 2013-08-20 MED ORDER — ENOXAPARIN SODIUM 40 MG/0.4ML ~~LOC~~ SOLN
40.0000 mg | SUBCUTANEOUS | Status: DC
  Administered 2013-08-20 – 2013-08-22 (×3): 40 mg via SUBCUTANEOUS
  Filled 2013-08-20 (×3): qty 0.4

## 2013-08-20 NOTE — H&P (Signed)
[facility] History and Physical  Danny Waters DXA:[id number] DOB: Oct 06, 1929 DOA: 08/19/2013  Referring physician: ER physician. PCP: Florina Ou, MD  Specialists: Dr. Julien Nordmann. Oncologist.  Chief Complaint: Fall.  History obtained from patient's son.  HPI: Danny Waters is a 80 y.o. male with history of metastatic pancreatic cancer, history of PE, CAD, diabetes mellitus off medication was brought to the ER after patient was found on the floor by patient's son. Patient's son states patient was discharged from hospice home 2 weeks ago and patient lives in his home alone. Since he was not responding to phone calls his son went to check on him and was found on the floor. Patient has been feeling weak over the last few days. In the ER CT scan of the head and neck was done which did not show anything acute except for soft tissue swelling. At this time patient has been admitted for possible placement. Patient on exam follows commands and denies any pain. His lower extremities every week and his son states it has been like so for last few weeks.  Review of Systems: As presented in the history of presenting illness, rest negative.  Past Medical History  Diagnosis Date  . Hyperlipidemia     takes Lipitor daily  . Hypertension     takes Amlodipine,Metoprolol,and Losartan daily  . Hx of colonic polyps   . Type 2 diabetes mellitus with vascular disease   . Aortic stenosis     Last ECHO Dr. Wynonia Lawman 09/24/12  AVA 0.76 assymptomatic   . CAD (coronary artery disease) 09/24/2012    Cath 07/04/2002 normal Left main, 50% stenosis proximal LAD, 99% stenosis mid LAD, 50% stenosis mid CFX, 60% stenosis proximal PDA, stent placed mid LAD  Taxus stent to mid LAD 07/04/2002   . Hypertensive heart disease   . Pancreatic cancer     metastatic, tx by Dr. Julien Nordmann  . Malignant melanoma    Past Surgical History  Procedure Laterality Date  . [place]  2003  . Hernia repair  1957  . Appendectomy     . Cardiac catheterization  10+yrs ago  . Coronary stent placement  2004    Taxus stent to mid LAD  . Colonoscopy    . Melanoma excision  06/07/2011    Procedure: MELANOMA EXCISION;  Surgeon: Harl Bowie, MD;  Location: [place];  Service: General;  Laterality: Right;  wide excision melanoma right shoulder  and left back and left upper arm   . Mastoid debridement     Social History:  reports that he quit smoking about 55 years ago. His smoking use included Cigarettes. He has a 20 pack-year smoking history. He has never used smokeless tobacco. He reports that he drinks alcohol. He reports that he does not use illicit drugs. Where does patient live  home. Can patient participate in ADLs?  No.  No Known Allergies  Family History:  Family History  Problem Relation Age of Onset  . Heart disease Mother   . Hypertension Mother   . Anesthesia problems Neg Hx   . Hypotension Neg Hx   . Malignant hyperthermia Neg Hx   . Pseudochol deficiency Neg Hx   . Hypertension Father       Prior to Admission medications   Medication Sig Start Date End Date Taking? Authorizing Provider  aspirin 81 MG tablet Take 81 mg by mouth daily.   Yes Historical Provider, MD    Physical Exam: Filed Vitals:   08/20/13  0300 08/20/13 0330 08/20/13 0439 08/20/13 0520  BP: 124/61 120/74 138/97 125/76  Pulse: 104 99 102 98  Temp:   97.8 F (36.6 C) 98 F (36.7 C)  TempSrc:    Oral  Resp: 22 23 19 19   Height:    5\' 11"  (1.803 m)  Weight:    69.899 kg (154 lb 1.6 oz)  SpO2: 94% 92% 96% 94%     General:  Well-developed and poorly nourished.  Eyes:  Anicteric no pallor.  ENT:  No discharge from the ears eyes nose mouth.  Neck:  No mass felt.  Cardiovascular:  S1-S2 heard.  Respiratory:  No rhonchi or crepitations.  Abdomen:  Soft nontender bowel sounds present.  Skin:  No rash.  Musculoskeletal:  No edema.  Psychiatric:  Patient is alert awake and oriented.  Neurologic:  Weakness of the  both lower extremities.  Labs on Admission:  Basic Metabolic Panel: No results found for this basename: NA, K, CL, CO2, GLUCOSE, BUN, CREATININE, CALCIUM, MG, PHOS,  in the last 168 hours Liver Function Tests:  Recent Labs Lab 08/19/13 2353  AST 39*  ALT 24  ALKPHOS 617*  BILITOT 3.9*  PROT 6.3  ALBUMIN 2.7*   No results found for this basename: LIPASE, AMYLASE,  in the last 168 hours No results found for this basename: AMMONIA,  in the last 168 hours CBC:  Recent Labs Lab 08/19/13 2353  WBC 18.9*  NEUTROABS 17.1*  HGB 13.7  HCT 41.7  MCV 99.8  PLT 222   Cardiac Enzymes:  Recent Labs Lab 08/19/13 2353  CKTOTAL 456*    BNP (last 3 results)  Recent Labs  07/16/13 1338  PROBNP 496.5*   CBG: No results found for this basename: GLUCAP,  in the last 168 hours  Radiological Exams on Admission: Dg Chest 2 View  08/20/2013   CLINICAL DATA:  Shortness of breath and back pain  EXAM: CHEST  2 VIEW  COMPARISON:  Prior radiograph from 07/16/2013  FINDINGS: Right-sided Port-A-Cath again noted in. Cardiomegaly is stable. Atherosclerotic calcifications present within the aortic arch.  The lungs are normally inflated. On lateral projection, patchy opacity is seen within the posterior aspect of the lungs. This appears to correspond with the retrocardiac left lower lobe on frontal projection. This opacity is not seen on recent radiograph from 07/16/2013. Finding may reflect atelectasis or infiltrate. No pulmonary edema or pleural effusion. No pneumothorax.  No acute osseous abnormality identified. Multilevel degenerative changes noted within the visualized spine  IMPRESSION: 1. Patchy opacity within the posterior left lower lobe. While this finding may reflect atelectasis, possible infiltrate could be considered in the correct clinical setting. 2. Stable cardiomegaly without pulmonary edema.   Electronically Signed   By: Jeannine Boga M.D.   On: 08/20/2013 04:18   Dg Lumbar  Spine Complete  08/20/2013   CLINICAL DATA:  Status post fall.  Lower back pain.  EXAM: LUMBAR SPINE - COMPLETE 4+ VIEW  COMPARISON:  Abdominal radiograph performed 06/25/2012, and CT of the abdomen from 07/14/2013  FINDINGS: There is no evidence of fracture or subluxation. Vertebral bodies demonstrate normal height and alignment. There is minimal narrowing of the intervertebral disc space at L4-L5. Mild facet disease is noted at the lower lumbar spine. Slight anterior wedging of vertebral body T12 is likely developmental in nature.  The visualized bowel gas pattern is unremarkable in appearance; air and stool are noted within the colon. The sacroiliac joints are within normal limits. Scattered calcification is  seen along the abdominal aorta and its branches.  IMPRESSION: 1. No evidence of acute fracture or subluxation along the lumbar spine. 2. Minimal degenerative change at the lower lumbar spine. 3. Scattered vascular calcifications seen.   Electronically Signed   By: Garald Balding M.D.   On: 08/20/2013 00:26   Ct Head Wo Contrast  08/20/2013   CLINICAL DATA:  Status post fall. Patient found on floor after 13 hours. Altered mental status. Left-sided scleral hemorrhage and small abrasion at the back of the head. Concern for cervical spine injury.  EXAM: CT HEAD WITHOUT CONTRAST  CT CERVICAL SPINE WITHOUT CONTRAST  TECHNIQUE: Multidetector CT imaging of the head and cervical spine was performed following the standard protocol without intravenous contrast. Multiplanar CT image reconstructions of the cervical spine were also generated.  COMPARISON:  CT of the head performed 07/16/2013  FINDINGS: CT HEAD FINDINGS  There is no evidence of acute infarction, mass lesion, or intra- or extra-axial hemorrhage on CT.  The posterior fossa, including the cerebellum, brainstem and fourth ventricle, is within normal limits. The third and lateral ventricles, and basal ganglia are unremarkable in appearance. The cerebral  hemispheres are symmetric in appearance, with normal gray-white differentiation. No mass effect or midline shift is seen.  There is no evidence of fracture; visualized osseous structures are unremarkable in appearance. The visualized portions of the orbits are within normal limits. The paranasal sinuses and mastoid air cells are well-aerated. Mild soft tissue swelling is noted overlying the parietal calvarium bilaterally.  CT CERVICAL SPINE FINDINGS  There is no evidence of fracture or subluxation. Vertebral bodies demonstrate normal height and alignment. Intervertebral disc spaces are preserved. Prevertebral soft tissues are within normal limits. A small posterior disc osteophyte complex is noted at C3-C4.  The visualized portions of the thyroid gland are unremarkable in appearance, aside from a partially characterized focus of calcification at the right thyroid lobe. Minimal scarring is noted at the right lung apex. Calcification is noted along the carotid bifurcations bilaterally, with likely mild luminal narrowing along the proximal right internal carotid artery.  IMPRESSION: 1. No evidence of traumatic intracranial injury or fracture. 2. No evidence of fracture or subluxation along the cervical spine. 3. Mild soft tissue swelling noted overlying the parietal calvarium bilaterally. 4. Minimal scarring at the right lung apex. 5. Calcification along the carotid bifurcations bilaterally, with likely mild luminal narrowing along the proximal right internal carotid artery. Carotid ultrasound would be helpful for further evaluation, when and as deemed clinically appropriate.   Electronically Signed   By: Garald Balding M.D.   On: 08/20/2013 00:49   Ct Cervical Spine Wo Contrast  08/20/2013   CLINICAL DATA:  Status post fall. Patient found on floor after 13 hours. Altered mental status. Left-sided scleral hemorrhage and small abrasion at the back of the head. Concern for cervical spine injury.  EXAM: CT HEAD WITHOUT  CONTRAST  CT CERVICAL SPINE WITHOUT CONTRAST  TECHNIQUE: Multidetector CT imaging of the head and cervical spine was performed following the standard protocol without intravenous contrast. Multiplanar CT image reconstructions of the cervical spine were also generated.  COMPARISON:  CT of the head performed 07/16/2013  FINDINGS: CT HEAD FINDINGS  There is no evidence of acute infarction, mass lesion, or intra- or extra-axial hemorrhage on CT.  The posterior fossa, including the cerebellum, brainstem and fourth ventricle, is within normal limits. The third and lateral ventricles, and basal ganglia are unremarkable in appearance. The cerebral hemispheres are symmetric in appearance,  with normal gray-white differentiation. No mass effect or midline shift is seen.  There is no evidence of fracture; visualized osseous structures are unremarkable in appearance. The visualized portions of the orbits are within normal limits. The paranasal sinuses and mastoid air cells are well-aerated. Mild soft tissue swelling is noted overlying the parietal calvarium bilaterally.  CT CERVICAL SPINE FINDINGS  There is no evidence of fracture or subluxation. Vertebral bodies demonstrate normal height and alignment. Intervertebral disc spaces are preserved. Prevertebral soft tissues are within normal limits. A small posterior disc osteophyte complex is noted at C3-C4.  The visualized portions of the thyroid gland are unremarkable in appearance, aside from a partially characterized focus of calcification at the right thyroid lobe. Minimal scarring is noted at the right lung apex. Calcification is noted along the carotid bifurcations bilaterally, with likely mild luminal narrowing along the proximal right internal carotid artery.  IMPRESSION: 1. No evidence of traumatic intracranial injury or fracture. 2. No evidence of fracture or subluxation along the cervical spine. 3. Mild soft tissue swelling noted overlying the parietal calvarium  bilaterally. 4. Minimal scarring at the right lung apex. 5. Calcification along the carotid bifurcations bilaterally, with likely mild luminal narrowing along the proximal right internal carotid artery. Carotid ultrasound would be helpful for further evaluation, when and as deemed clinically appropriate.   Electronically Signed   By: Garald Balding M.D.   On: 08/20/2013 00:49   Dg Shoulder Left  08/20/2013   CLINICAL DATA:  Status post fall; left shoulder pain.  EXAM: LEFT SHOULDER - 2+ VIEW  COMPARISON:  None.  FINDINGS: There is no evidence of fracture or dislocation. The left humeral head is seated within the glenoid fossa. The acromioclavicular joint is unremarkable in appearance. No significant soft tissue abnormalities are seen. The visualized portions of the left lung are clear.  IMPRESSION: No evidence of fracture or dislocation.   Electronically Signed   By: Garald Balding M.D.   On: 08/20/2013 00:24     Assessment/Plan Principal Problem:   Weakness Active Problems:   Pancreatic cancer metastasized to liver   Protein-calorie malnutrition, severe   1. Weakness with failure to thrive in a patient with metastatic pancreatic cancer. 2. History of PE presently on no medications. 3. History of diabetes mellitus off medications. 4. History of CAD  Plan - at this time I have discussed with patient and patient's son and the plan is to keep patient comfortable. Have consulted hospice and physical therapy for possible placement. No further labs.    Code Status:  DO NOT RESUSCITATE.  Family Communication:  Patient's son.  Disposition Plan:  Admit to inpatient.    [facility] Pager [phone].  If 7PM-7AM, please contact night-coverage [email or web address] Password [email or web address] 08/20/2013, 7:03 AM

## 2013-08-20 NOTE — Progress Notes (Signed)
I saw and examined Danny Waters at bedside and reviewed his chart. I also discussed his care plan with his son Danny Waters over the phone([phone]) to rehash the care plan. The son is of the understanding that the patient is no longer able to function by himself at home and he is agreeable with comfort measures and referral to skilled nursing facility all of the appropriate or other appropriate facility. However he had not been clear about infection from the earlier conversation today. He is therefore open to patient being on antibiotics while disposition options are being explored. I have therefore added and Levaquin and will continue IV fluids. Await palliative care evaluation. Also consulted physical therapy and clinical social work.

## 2013-08-20 NOTE — Progress Notes (Signed)
UR completed. CSW consult is already ordered for pt's placement.   Sandi Mariscal, RN BSN [place] CCM Trauma/Neuro ICU Case Manager [phone]

## 2013-08-20 NOTE — Evaluation (Signed)
Physical Therapy Evaluation Patient Details Name: Danny Waters MRN: [id number] DOB: 13-Jun-1929 Today's Date: 08/20/2013   History of Present Illness  Pt admitted after being found on the floor for an indeterminate amount of time.  Per son pt has displayed more weakness last 2-3 days PTA.  Pt admitted for evaluation and possible placement  Clinical Impression  Pt admitted after fall with weakness.  He has FTT at home after d/c from hospice home and is for possible placement.  Pt currently limited functionally due to the problems listed. ( See problems list.)   Pt will benefit from PT to maximize function and safety in order to get ready for next venue listed below.     Follow Up Recommendations SNF    Equipment Recommendations  None recommended by PT    Recommendations for Other Services       Precautions / Restrictions Precautions Precautions: Fall      Mobility  Bed Mobility Overal bed mobility: Needs Assistance Bed Mobility: Supine to Sit     Supine to sit: Min assist     General bed mobility comments: steady assist only while pt got up and scooted to EOB slowly  Transfers Overall transfer level: Needs assistance Equipment used:  (iv pole) Transfers: Sit to/from Stand Sit to Stand: Min assist (from bed and toilet)         General transfer comment: assist to come forward and minimal lift  Ambulation/Gait Ambulation/Gait assistance: Min assist Ambulation Distance (Feet): 15 Feet (x2) Assistive device:  (pushing IV pole) Gait Pattern/deviations: Step-through pattern   Gait velocity interpretation: Below normal speed for age/gender General Gait Details: short, mildly unsteady steps  Stairs            Wheelchair Mobility    Modified Rankin (Stroke Patients Only)       Balance Overall balance assessment: Needs assistance Sitting-balance support: Feet supported;No upper extremity supported Sitting balance-Leahy Scale: Fair     Standing balance  support: No upper extremity supported Standing balance-Leahy Scale: Fair                               Pertinent Vitals/Pain     Home Living Family/patient expects to be discharged to:: Skilled nursing facility                      Prior Function           Comments: pt was home alone using cane to mobilize. Son and girlfriend were checking in daily.  No son feels pt will need SNF placement     Hand Dominance   Dominant Hand: Right    Extremity/Trunk Assessment   Upper Extremity Assessment: Generalized weakness           Lower Extremity Assessment: Generalized weakness (R LE noticeably weaker than L LE at 3+ to 4-/5)         Communication   Communication: No difficulties  Cognition Arousal/Alertness: Awake/alert Behavior During Therapy: WFL for tasks assessed/performed Overall Cognitive Status: Difficult to assess                      General Comments      Exercises        Assessment/Plan    PT Assessment Patient needs continued PT services  PT Diagnosis Generalized weakness;Difficulty walking   PT Problem List Decreased strength;Decreased activity tolerance;Decreased balance;Decreased mobility;Decreased knowledge of use  of DME;Decreased knowledge of precautions;Pain  PT Treatment Interventions Gait training;Functional mobility training;Therapeutic activities;Balance training;Patient/family education   PT Goals (Current goals can be found in the Care Plan section) Acute Rehab PT Goals Patient Stated Goal: continue to be able to move around PT Goal Formulation: With patient Time For Goal Achievement: 08/27/13 Potential to Achieve Goals: Good    Frequency Min 3X/week   Barriers to discharge Decreased caregiver support      Co-evaluation               End of Session   Activity Tolerance: Patient tolerated treatment well Patient left: in chair;with call bell/phone within reach;with family/visitor present;with  nursing/sitter in room Nurse Communication: Mobility status         Time: 1720-1745 PT Time Calculation (min): 25 min   Charges:   PT Evaluation $Initial PT Evaluation Tier I: 1 Procedure PT Treatments $Gait Training: 8-22 mins   PT G CodesTessie Fass Anirudh Baiz 08/20/2013, 6:04 PM 08/20/2013  Donnella Sham, PT [phone]  (pager)

## 2013-08-20 NOTE — Progress Notes (Signed)
Inpatient RN visit- [facility] 6N Room-24  -[facility] RN Visit-Karen Alford [place] RN  Related admission to [facility] diagnosis of Pancreatic Ca w/liver mets.  Pt is DNR code; OOF DNR in place in home.  Pt alert & oriented, lying in bed, with no complaints of pain or discomfort.  Pt able to recall what brought him to the hospital. 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  Pt also discussed his strong faith and belief that :things will work out the way they are supposed to". Assisted with drinking water, no difficulty swallowing. Probation officer spoke with Staff CSW [place] and [place]. [facility] SW will f/u with Staff CSW regarding disposition options. No family present.  [facility] will continue to follow through discharge.  Patient's home medication list and transfer summary in place on shadow chart.   Please call [facility] @ [phone] with any hospice needs.   Thank you. Tracey Harries, RN  [facility]  Hospice Liaison  [phone])

## 2013-08-20 NOTE — Progress Notes (Signed)
INITIAL NUTRITION ASSESSMENT  DOCUMENTATION CODES Per approved criteria  -Severe malnutrition in the context of chronic illness  Pt meets criteria for SEVERE MALNUTRITION in the context of Chronic Illness as evidenced by 21% weight loss in less than 8 months and estimated energy intake <75% of estimated energy needs for > 1 month.  INTERVENTION: Provide Ensure Complete BID  NUTRITION DIAGNOSIS: Unintentional weight loss related to metastatic cancer and inadequate oral intake as evidenced by 21% weight loss in less than 8 months.   Goal: Comfort  Monitor:  Goals of care, PO intake, weight trend, labs  Reason for Assessment: Malnutrition Screening Tool, score of 5  80 y.o. male  Admitting Dx: Weakness  ASSESSMENT: 80 y.o. male with history of metastatic pancreatic cancer, history of PE, CAD, diabetes mellitus off medication was brought to the ER after patient was found on the floor by patient's son. Patient's son states patient was discharged from hospice home 2 weeks ago and patient lives in his home alone.  Per chart pt is a hospice care patient. Plan is to keep pt comfortable.  Weight history shows pt has had 21% weight loss in less than 8 months with 10% weight loss within the past 3 months. Pt states he used to eat too much and his appetite has been good. He reports he has been eating 1-2 meals daily. He states he likes Ensure supplements and is interested in receiving them between meals. Per nursing notes, pt ate 50% of breakfast today.   Height: Ht Readings from Last 1 Encounters:  08/20/13 5\' 11"  (1.803 m)    Weight: Wt Readings from Last 1 Encounters:  08/20/13 154 lb 1.6 oz (69.899 kg)    Ideal Body Weight: 172 lbs  % Ideal Body Weight: 90%  Wt Readings from Last 10 Encounters:  08/20/13 154 lb 1.6 oz (69.899 kg)  07/20/13 159 lb 6.3 oz (72.3 kg)  06/30/13 162 lb 3.2 oz (73.573 kg)  06/02/13 171 lb 11.2 oz (77.883 kg)  05/19/13 175 lb (79.379 kg)  04/21/13  171 lb 12.8 oz (77.928 kg)  04/06/13 178 lb 11.2 oz (81.058 kg)  03/28/13 190 lb (86.183 kg)  12/29/12 195 lb 4.8 oz (88.587 kg)  06/25/12 196 lb 6.4 oz (89.086 kg)    Usual Body Weight: 195 lbs  % Usual Body Weight: 79%  BMI:  Body mass index is 21.5 kg/(m^2).  Estimated Nutritional Needs: Kcal: 1900-2100 Protein: 85-95 grams Fluid: 1.9-2.1 L/day  Skin: wounds on buttocks and on right leg  Diet Order: General  EDUCATION NEEDS: -No education needs identified at this time   Intake/Output Summary (Last 24 hours) at 08/20/13 1339 Last data filed at 08/20/13 0945  Gross per 24 hour  Intake   1360 ml  Output    580 ml  Net    780 ml    Last BM: 4/29   Labs:   Recent Labs Lab 08/20/13 0001 08/20/13 0835  NA 142  --   K 5.0  --   CL 102  --   BUN 26*  --   CREATININE 1.20 0.80  GLUCOSE 156*  --     CBG (last 3)  No results found for this basename: GLUCAP,  in the last 72 hours  Scheduled Meds: . sodium chloride   Intravenous STAT  . aspirin EC  81 mg Oral Daily  . enoxaparin (LOVENOX) injection  40 mg Subcutaneous Q24H  . levofloxacin (LEVAQUIN) IV  750 mg Intravenous Q24H  Continuous Infusions: . sodium chloride 50 mL/hr at 08/20/13 3474    Past Medical History  Diagnosis Date  . Hyperlipidemia     takes Lipitor daily  . Hypertension     takes Amlodipine,Metoprolol,and Losartan daily  . Hx of colonic polyps   . Type 2 diabetes mellitus with vascular disease   . Aortic stenosis     Last ECHO Dr. Wynonia Lawman 09/24/12  AVA 0.76 assymptomatic   . CAD (coronary artery disease) 09/24/2012    Cath 07/04/2002 normal Left main, 50% stenosis proximal LAD, 99% stenosis mid LAD, 50% stenosis mid CFX, 60% stenosis proximal PDA, stent placed mid LAD  Taxus stent to mid LAD 07/04/2002   . Hypertensive heart disease   . Pancreatic cancer     metastatic, tx by Dr. Julien Nordmann  . Malignant melanoma     Past Surgical History  Procedure Laterality Date  . [place]  2003  . Hernia repair  1957  . Appendectomy    . Cardiac catheterization  10+yrs ago  . Coronary stent placement  2004    Taxus stent to mid LAD  . Colonoscopy    . Melanoma excision  06/07/2011    Procedure: MELANOMA EXCISION;  Surgeon: Harl Bowie, MD;  Location: [facility];  Service: General;  Laterality: Right;  wide excision melanoma right shoulder  and left back and left upper arm   . Mastoid debridement      Pryor Ochoa RD, LDN Inpatient Clinical Dietitian Pager: [phone] After Hours Pager: [phone]

## 2013-08-20 NOTE — Progress Notes (Signed)
[facility] Work note Patient is currently receiving services from hospice and has been home care patient for approximately one week - he had been at [facility], yet was able to return home. He lives alone, yet reports a girlfriend and son check in on him. He acknowledges that he may not be able to return home and is trying to figure out, "where to put me". We talked about nursing facilities in town and role of hospice. He voiced understanding and was appreciative of support. LCSW had communicated with hospital SW, Raquel Sarna prior to visit and will continue to communicate regarding disposition. Katherina Right, [place]

## 2013-08-21 DIAGNOSIS — Z515 Encounter for palliative care: Secondary | ICD-10-CM

## 2013-08-21 DIAGNOSIS — R5383 Other fatigue: Secondary | ICD-10-CM

## 2013-08-21 DIAGNOSIS — I359 Nonrheumatic aortic valve disorder, unspecified: Secondary | ICD-10-CM

## 2013-08-21 DIAGNOSIS — R5381 Other malaise: Secondary | ICD-10-CM

## 2013-08-21 DIAGNOSIS — C259 Malignant neoplasm of pancreas, unspecified: Secondary | ICD-10-CM

## 2013-08-21 DIAGNOSIS — W19XXXA Unspecified fall, initial encounter: Secondary | ICD-10-CM

## 2013-08-21 DIAGNOSIS — C787 Secondary malignant neoplasm of liver and intrahepatic bile duct: Secondary | ICD-10-CM

## 2013-08-21 LAB — URINE CULTURE
Colony Count: NO GROWTH
Culture: NO GROWTH

## 2013-08-21 MED ORDER — CIPROFLOXACIN HCL 0.3 % OP SOLN
2.0000 [drp] | OPHTHALMIC | Status: DC
  Administered 2013-08-21 – 2013-08-22 (×5): 2 [drp] via OPHTHALMIC
  Filled 2013-08-21: qty 2.5

## 2013-08-21 MED ORDER — NAPHAZOLINE HCL 0.1 % OP SOLN
1.0000 [drp] | Freq: Four times a day (QID) | OPHTHALMIC | Status: DC | PRN
  Administered 2013-08-21: 1 [drp] via OPHTHALMIC
  Filled 2013-08-21: qty 15

## 2013-08-21 MED ORDER — LEVOFLOXACIN 750 MG PO TABS
750.0000 mg | ORAL_TABLET | Freq: Every day | ORAL | Status: DC
  Administered 2013-08-22: 750 mg via ORAL
  Filled 2013-08-21: qty 1

## 2013-08-21 MED ORDER — PANTOPRAZOLE SODIUM 40 MG PO TBEC
40.0000 mg | DELAYED_RELEASE_TABLET | Freq: Every day | ORAL | Status: DC
  Administered 2013-08-21 – 2013-08-22 (×2): 40 mg via ORAL
  Filled 2013-08-21 (×2): qty 1

## 2013-08-21 MED ORDER — LIDOCAINE 5 % EX PTCH
1.0000 | MEDICATED_PATCH | CUTANEOUS | Status: DC
  Administered 2013-08-21: 1 via TRANSDERMAL
  Filled 2013-08-21 (×2): qty 1

## 2013-08-21 NOTE — Consult Note (Signed)
Patient FT:Danny Waters      DOB: [birth year]      RKY:[id number]     Consult Note from the Palliative Medicine Team at [place] Requested by: Dr. Sanjuana Letters     PCP: Florina Ou, MD Reason for Consultation: Goals of Care    Phone Number:[phone]  Assessment of patients Current state: 80 yr old white male with metastatic pancreatic cancer who was recently discharged to [facility] thinking that he was taking a rapid decline.  He stabilized and was discharged to home where he was found down.  Patient seems to holding his own with regard to his cancer although his self neglect is getting the better off him and one can not be fulling sure that his syncope is not related to his aortic stenosis or pe/dvt.   Goals of Care: 1.  Code Status: DNR   2. Scope of Treatment: Son would still like to treat treatable issues like pain, pneumonia but understands that we are limited by his cancer.  He would like to see if he does ok with rehab and is open to [facility] to follow.  He would be open to Hospice picking him back up at a later time.   4. Disposition: SNF for rehab with PCS recommendation   3. Symptom Management:   1. Pain: ribs do not appear to be broken on xray. Will add a lidocaine patch.  Prn tylenol likely will help. 2. Bowel Regimen: monitor 3. Edema: caution with continued hydration 4. Pneumonia: continue antibiotics po 5.   4. Psychosocial: one son who is disable but trying to help as best that he can.  5. Spiritual: chaplain visit would be welcomed .  He expresses loneliness and would welcome company. Hospice chaplain currently involved.        Patient Documents Completed or Given: Document Given Completed  Advanced Directives Pkt    MOST    DNR    Gone from My Sight    Hard Choices      Brief HPI: 80 yr old white male with a known past medical history for  metastatic pancreatic cancer,  Recent diagnosis of PE, extensive DVT.  Patient was discharged to  [facility] from last discharge as they felt that he was going to decline.  He did not decline and so was discharged to home where he was not eating or getting out of his chair per his son.  Patient was subsequently found down at home by his son.  He was not responding to phone calls which is why his son came to check on him.  Patient states he had one of those spells where every thing goes black.   ROS: reports some left rib pain with movement, denies nausea, vomiting    PMH:  Past Medical History  Diagnosis Date  . Hyperlipidemia     takes Lipitor daily  . Hypertension     takes Amlodipine,Metoprolol,and Losartan daily  . Hx of colonic polyps   . Type 2 diabetes mellitus with vascular disease   . Aortic stenosis     Last ECHO Dr. Wynonia Lawman 09/24/12  AVA 0.76 assymptomatic   . CAD (coronary artery disease) 09/24/2012    Cath 07/04/2002 normal Left main, 50% stenosis proximal LAD, 99% stenosis mid LAD, 50% stenosis mid CFX, 60% stenosis proximal PDA, stent placed mid LAD  Taxus stent to mid LAD 07/04/2002   . Hypertensive heart disease   . Pancreatic cancer  metastatic, tx by Dr. Julien Nordmann  . Malignant melanoma      PSH: Past Surgical History  Procedure Laterality Date  . [place]  2003  . Hernia repair  1957  . Appendectomy    . Cardiac catheterization  10+yrs ago  . Coronary stent placement  2004    Taxus stent to mid LAD  . Colonoscopy    . Melanoma excision  06/07/2011    Procedure: MELANOMA EXCISION;  Surgeon: Harl Bowie, MD;  Location: [place];  Service: General;  Laterality: Right;  wide excision melanoma right shoulder  and left back and left upper arm   . Mastoid debridement     I have reviewed the FH and SH and  If appropriate update it with new information. No Known Allergies Scheduled Meds: . aspirin EC  81 mg Oral Daily  . enoxaparin (LOVENOX) injection  40 mg Subcutaneous Q24H  . feeding supplement (ENSURE COMPLETE)  237 mL Oral BID BM  .  levofloxacin (LEVAQUIN) IV  750 mg Intravenous Q24H   Continuous Infusions: . sodium chloride 50 mL/hr at 08/21/13 0845   PRN Meds:.acetaminophen, acetaminophen, ondansetron (ZOFRAN) IV, ondansetron    BP 155/77  Pulse 93  Temp(Src) 98 F (36.7 C) (Oral)  Resp 18  Ht 5\' 11"  (1.803 m)  Wt 69.899 kg (154 lb 1.6 oz)  BMI 21.50 kg/m2  SpO2 96%   PPS: 40-50%   Intake/Output Summary (Last 24 hours) at 08/21/13 1041 Last data filed at 08/21/13 0900  Gross per 24 hour  Intake 1253.33 ml  Output   1300 ml  Net -46.67 ml   LBM: 4/30                      Physical Exam:  General: intermittently confused but can carry on ever day conversation HEENT:  Pupils equal, left conjunctival hemorrhage, mmm speech clear Chest:  Decreased but clear anteriorly, no significant pain on palpation of ribs although patient reports pain with movement CVS: regular S1, S2 high pitched aortic murmur Abdomen soft, not tender or distended Ext: trace pitting edema,warm, no mottling Neuro:oriented to self and general events , not oriented to time and tells me about blacking out as if he were still home.  Labs: CBC    Component Value Date/Time   WBC 15.4* 08/20/2013 0835   WBC 6.1 07/14/2013 1307   RBC 3.87* 08/20/2013 0835   RBC 3.40* 07/14/2013 1307   HGB 12.6* 08/20/2013 0835   HGB 11.1* 07/14/2013 1307   HCT 39.4 08/20/2013 0835   HCT 33.7* 07/14/2013 1307   PLT 200 08/20/2013 0835   PLT 135* 07/14/2013 1307   MCV 101.8* 08/20/2013 0835   MCV 98.9* 07/14/2013 1307   MCH 32.6 08/20/2013 0835   MCH 32.6 07/14/2013 1307   MCHC 32.0 08/20/2013 0835   MCHC 33.0 07/14/2013 1307   RDW 15.8* 08/20/2013 0835   RDW 16.5* 07/14/2013 1307   LYMPHSABS 0.8 08/19/2013 2353   LYMPHSABS 1.3 07/14/2013 1307   MONOABS 1.0 08/19/2013 2353   MONOABS 0.5 07/14/2013 1307   EOSABS 0.0 08/19/2013 2353   EOSABS 0.1 07/14/2013 1307   BASOSABS 0.0 08/19/2013 2353   BASOSABS 0.0 07/14/2013 1307      CMP     Component Value  Date/Time   NA 142 08/20/2013 0001   NA 139 07/14/2013 1307   K 5.0 08/20/2013 0001   K 3.2* 07/14/2013 1307   CL 102 08/20/2013 0001   CL  94* 06/25/2012 1008   CO2 29 07/17/2013 0403   CO2 25 07/14/2013 1307   GLUCOSE 156* 08/20/2013 0001   GLUCOSE 104 07/14/2013 1307   GLUCOSE 399 Repeated and Verified* 06/25/2012 1008   BUN 26* 08/20/2013 0001   BUN 9.4 07/14/2013 1307   CREATININE 0.80 08/20/2013 0835   CREATININE 0.7 07/14/2013 1307   CALCIUM 9.8 07/17/2013 0403   CALCIUM 9.3 07/14/2013 1307   PROT 6.3 08/19/2013 2353   PROT 5.8* 07/14/2013 1307   ALBUMIN 2.7* 08/19/2013 2353   ALBUMIN 2.8* 07/14/2013 1307   AST 39* 08/19/2013 2353   AST 26 07/14/2013 1307   ALT 24 08/19/2013 2353   ALT 23 07/14/2013 1307   ALKPHOS 617* 08/19/2013 2353   ALKPHOS 500* 07/14/2013 1307   BILITOT 3.9* 08/19/2013 2353   BILITOT 1.34* 07/14/2013 1307   GFRNONAA 82* 08/20/2013 0835   GFRAA >90 08/20/2013 0835    Chest Xray Reviewed/Impressions: 1. Patchy opacity within the posterior left lower lobe. While this  finding may reflect atelectasis, possible infiltrate could be  considered in the correct clinical setting.  2. Stable cardiomegaly without pulmonary edema.    CT scan of the Head Reviewed/Impressions:  1. No evidence of traumatic intracranial injury or fracture.  2. No evidence of fracture or subluxation along the cervical spine.  3. Mild soft tissue swelling noted overlying the parietal calvarium  bilaterally.  4. Minimal scarring at the right lung apex.  5. Calcification along the carotid bifurcations bilaterally, with  likely mild luminal narrowing along the proximal right internal  carotid artery. Carotid ultrasound would be helpful for further  evaluation, when and as deemed clinically appropriate.      Time In Time Out Total Time Spent with Patient Total Overall Time  1025/1130 am 1050 /1145 15 min     Greater than 50%  of this time was spent counseling and coordinating care related to the above  assessment and plan.    Emilyn Ruble L. Lovena Le, MD MBA The Palliative Medicine Team at [facility] Phone: [phone] Pager: [phone]

## 2013-08-21 NOTE — Progress Notes (Signed)
[facility] PROGRESS NOTE  Danny Waters:[id number] DOB: 1929/09/17 DOA: 08/19/2013 PCP: Florina Ou, MD  Brief Narrative Danny Waters is an 80 y.o. male with metastatic pancreatic cancer, history of PE, CAD, and diabetes mellitus off medications, who was brought to the ER after patient was found on the floor by his son. Patient's son stateed patient was discharged from hospice home 2 weeks ago and and that patient lives in his home alone. Patient was found to have a UTI with a white count of 18,000. He had been feeling weak over the last few days. In the ER CT scan of the head and neck was done which did not show anything acute except for soft tissue swelling. I spoke with patient's son over the phone yesterday and discussed the care plan. I also spoke with palliative care, Dr. Lovena Le today. Appreciate help. At this point the patient is being treated for UTI and he will need placement at skilled nursing facility once arrangements have been made. Plan Weakness/ Protein-calorie malnutrition, severe/UTI (urinary tract infection)/Rhabdomyolysis  Continue antibiotics  Discontinue IV fluids as patient taking oral Pancreatic cancer metastasized to liver/hypertension/aortic stenosis/diabetes mellitus type 2/malignant melanoma    no acute changes DVT/GI prophylaxis  Scds  Protonix Code Status: DNR Family Communication: Spoke with patient's son Ivie yesterday. Disposition Plan: SNF   Consultants:  Palliative care  Procedures:  None  Antibiotics:  Levaquin 08/20/13>  HPI/Subjective: Denies any complaints  Objective: Filed Vitals:   08/21/13 0517  BP: 155/77  Pulse: 93  Temp: 98 F (36.7 C)  Resp: 18    Intake/Output Summary (Last 24 hours) at 08/21/13 1224 Last data filed at 08/21/13 1100  Gross per 24 hour  Intake 1253.33 ml  Output   1600 ml  Net -346.67 ml   Filed Weights   08/19/13 2231 08/20/13 0520  Weight: 79.833 kg (176 lb) 69.899 kg (154 lb 1.6  oz)    Exam:   General:  comfortable at rest.  Cardiovascular: S1S2. RRR.  Respiratory:  Good air entry bilaterally  Abdomen: soft, nontender. +BS  Musculoskeletal: No pedal edema.   Data Reviewed: Basic Metabolic Panel:  Recent Labs Lab 08/20/13 0001 08/20/13 0835  NA 142  --   K 5.0  --   CL 102  --   GLUCOSE 156*  --   BUN 26*  --   CREATININE 1.20 0.80   Liver Function Tests:  Recent Labs Lab 08/19/13 2353  AST 39*  ALT 24  ALKPHOS 617*  BILITOT 3.9*  PROT 6.3  ALBUMIN 2.7*   No results found for this basename: LIPASE, AMYLASE,  in the last 168 hours No results found for this basename: AMMONIA,  in the last 168 hours CBC:  Recent Labs Lab 08/19/13 2353 08/20/13 0001 08/20/13 0835  WBC 18.9*  --  15.4*  NEUTROABS 17.1*  --   --   HGB 13.7 14.6 12.6*  HCT 41.7 43.0 39.4  MCV 99.8  --  101.8*  PLT 222  --  200   Cardiac Enzymes:  Recent Labs Lab 08/19/13 2353  CKTOTAL 456*   BNP (last 3 results)  Recent Labs  07/16/13 1338  PROBNP 496.5*   CBG: No results found for this basename: GLUCAP,  in the last 168 hours  Recent Results (from the past 240 hour(s))  URINE CULTURE     Status: None   Collection Time    08/20/13  1:00 AM      Result Value Ref  Range Status   Specimen Description URINE, CLEAN CATCH   Final   Special Requests NONE   Final   Culture  Setup Time     Final   Value: 08/20/2013 00:51     Performed at [place]     Final   Value: NO GROWTH     Performed at [facility]   Culture     Final   Value: NO GROWTH     Performed at [facility]   Report Status 08/21/2013 FINAL   Final     Studies: Dg Chest 2 View  08/20/2013   CLINICAL DATA:  Shortness of breath and back pain  EXAM: CHEST  2 VIEW  COMPARISON:  Prior radiograph from 07/16/2013  FINDINGS: Right-sided Port-A-Cath again noted in. Cardiomegaly is stable. Atherosclerotic calcifications present within the aortic arch.   The lungs are normally inflated. On lateral projection, patchy opacity is seen within the posterior aspect of the lungs. This appears to correspond with the retrocardiac left lower lobe on frontal projection. This opacity is not seen on recent radiograph from 07/16/2013. Finding may reflect atelectasis or infiltrate. No pulmonary edema or pleural effusion. No pneumothorax.  No acute osseous abnormality identified. Multilevel degenerative changes noted within the visualized spine  IMPRESSION: 1. Patchy opacity within the posterior left lower lobe. While this finding may reflect atelectasis, possible infiltrate could be considered in the correct clinical setting. 2. Stable cardiomegaly without pulmonary edema.   Electronically Signed   By: Jeannine Boga M.D.   On: 08/20/2013 04:18   Dg Lumbar Spine Complete  08/20/2013   CLINICAL DATA:  Status post fall.  Lower back pain.  EXAM: LUMBAR SPINE - COMPLETE 4+ VIEW  COMPARISON:  Abdominal radiograph performed 06/25/2012, and CT of the abdomen from 07/14/2013  FINDINGS: There is no evidence of fracture or subluxation. Vertebral bodies demonstrate normal height and alignment. There is minimal narrowing of the intervertebral disc space at L4-L5. Mild facet disease is noted at the lower lumbar spine. Slight anterior wedging of vertebral body T12 is likely developmental in nature.  The visualized bowel gas pattern is unremarkable in appearance; air and stool are noted within the colon. The sacroiliac joints are within normal limits. Scattered calcification is seen along the abdominal aorta and its branches.  IMPRESSION: 1. No evidence of acute fracture or subluxation along the lumbar spine. 2. Minimal degenerative change at the lower lumbar spine. 3. Scattered vascular calcifications seen.   Electronically Signed   By: Garald Balding M.D.   On: 08/20/2013 00:26   Ct Head Wo Contrast  08/20/2013   CLINICAL DATA:  Status post fall. Patient found on floor after 13  hours. Altered mental status. Left-sided scleral hemorrhage and small abrasion at the back of the head. Concern for cervical spine injury.  EXAM: CT HEAD WITHOUT CONTRAST  CT CERVICAL SPINE WITHOUT CONTRAST  TECHNIQUE: Multidetector CT imaging of the head and cervical spine was performed following the standard protocol without intravenous contrast. Multiplanar CT image reconstructions of the cervical spine were also generated.  COMPARISON:  CT of the head performed 07/16/2013  FINDINGS: CT HEAD FINDINGS  There is no evidence of acute infarction, mass lesion, or intra- or extra-axial hemorrhage on CT.  The posterior fossa, including the cerebellum, brainstem and fourth ventricle, is within normal limits. The third and lateral ventricles, and basal ganglia are unremarkable in appearance. The cerebral hemispheres are symmetric in appearance, with normal gray-white differentiation.  No mass effect or midline shift is seen.  There is no evidence of fracture; visualized osseous structures are unremarkable in appearance. The visualized portions of the orbits are within normal limits. The paranasal sinuses and mastoid air cells are well-aerated. Mild soft tissue swelling is noted overlying the parietal calvarium bilaterally.  CT CERVICAL SPINE FINDINGS  There is no evidence of fracture or subluxation. Vertebral bodies demonstrate normal height and alignment. Intervertebral disc spaces are preserved. Prevertebral soft tissues are within normal limits. A small posterior disc osteophyte complex is noted at C3-C4.  The visualized portions of the thyroid gland are unremarkable in appearance, aside from a partially characterized focus of calcification at the right thyroid lobe. Minimal scarring is noted at the right lung apex. Calcification is noted along the carotid bifurcations bilaterally, with likely mild luminal narrowing along the proximal right internal carotid artery.  IMPRESSION: 1. No evidence of traumatic intracranial  injury or fracture. 2. No evidence of fracture or subluxation along the cervical spine. 3. Mild soft tissue swelling noted overlying the parietal calvarium bilaterally. 4. Minimal scarring at the right lung apex. 5. Calcification along the carotid bifurcations bilaterally, with likely mild luminal narrowing along the proximal right internal carotid artery. Carotid ultrasound would be helpful for further evaluation, when and as deemed clinically appropriate.   Electronically Signed   By: Garald Balding M.D.   On: 08/20/2013 00:49   Ct Cervical Spine Wo Contrast  08/20/2013   CLINICAL DATA:  Status post fall. Patient found on floor after 13 hours. Altered mental status. Left-sided scleral hemorrhage and small abrasion at the back of the head. Concern for cervical spine injury.  EXAM: CT HEAD WITHOUT CONTRAST  CT CERVICAL SPINE WITHOUT CONTRAST  TECHNIQUE: Multidetector CT imaging of the head and cervical spine was performed following the standard protocol without intravenous contrast. Multiplanar CT image reconstructions of the cervical spine were also generated.  COMPARISON:  CT of the head performed 07/16/2013  FINDINGS: CT HEAD FINDINGS  There is no evidence of acute infarction, mass lesion, or intra- or extra-axial hemorrhage on CT.  The posterior fossa, including the cerebellum, brainstem and fourth ventricle, is within normal limits. The third and lateral ventricles, and basal ganglia are unremarkable in appearance. The cerebral hemispheres are symmetric in appearance, with normal gray-white differentiation. No mass effect or midline shift is seen.  There is no evidence of fracture; visualized osseous structures are unremarkable in appearance. The visualized portions of the orbits are within normal limits. The paranasal sinuses and mastoid air cells are well-aerated. Mild soft tissue swelling is noted overlying the parietal calvarium bilaterally.  CT CERVICAL SPINE FINDINGS  There is no evidence of fracture  or subluxation. Vertebral bodies demonstrate normal height and alignment. Intervertebral disc spaces are preserved. Prevertebral soft tissues are within normal limits. A small posterior disc osteophyte complex is noted at C3-C4.  The visualized portions of the thyroid gland are unremarkable in appearance, aside from a partially characterized focus of calcification at the right thyroid lobe. Minimal scarring is noted at the right lung apex. Calcification is noted along the carotid bifurcations bilaterally, with likely mild luminal narrowing along the proximal right internal carotid artery.  IMPRESSION: 1. No evidence of traumatic intracranial injury or fracture. 2. No evidence of fracture or subluxation along the cervical spine. 3. Mild soft tissue swelling noted overlying the parietal calvarium bilaterally. 4. Minimal scarring at the right lung apex. 5. Calcification along the carotid bifurcations bilaterally, with likely mild luminal narrowing along  the proximal right internal carotid artery. Carotid ultrasound would be helpful for further evaluation, when and as deemed clinically appropriate.   Electronically Signed   By: Garald Balding M.D.   On: 08/20/2013 00:49   Dg Shoulder Left  08/20/2013   CLINICAL DATA:  Status post fall; left shoulder pain.  EXAM: LEFT SHOULDER - 2+ VIEW  COMPARISON:  None.  FINDINGS: There is no evidence of fracture or dislocation. The left humeral head is seated within the glenoid fossa. The acromioclavicular joint is unremarkable in appearance. No significant soft tissue abnormalities are seen. The visualized portions of the left lung are clear.  IMPRESSION: No evidence of fracture or dislocation.   Electronically Signed   By: Garald Balding M.D.   On: 08/20/2013 00:24    Scheduled Meds: . aspirin EC  81 mg Oral Daily  . ciprofloxacin  2 drop Left Eye Q4H while awake  . enoxaparin (LOVENOX) injection  40 mg Subcutaneous Q24H  . feeding supplement (ENSURE COMPLETE)  237 mL  Oral BID BM  . [START ON 08/22/2013] levofloxacin  750 mg Oral Daily   Continuous Infusions:     Charday Capetillo  [facility] Pager [phone]. If 7PM-7AM, please contact night-coverage at [email or web address], password [facility] 08/21/2013, 12:24 PM  LOS: 2 days

## 2013-08-21 NOTE — Progress Notes (Addendum)
Inpatient RN visit- Delwyn Scoggin [facility] 6N Room 24 -[facility] RN Visit-Karen Alford [place] RN  Related admission to [facility] diagnosis of Pancreatic Ca  Pt is DNR code.  OOF DNR in place in home. Pt alert,lying in bed, oriented to self with intermittent confusion.  Pt c/o of some pain in the center of his "butt".  Pt repositioned to side with pillow to relieve pressure, he expressed relief, denied need for medication.  Left eye with increased redness and some dried discharge, spoke w/ Staff RN Joaquim Lai she will f/u with attending MD. Current plan is for discharge to SNF.  [facility] team aware and is working with hospital SW and family. No family present.  Per conversation with Dr. Jerilynn Mages. Lovena Le patient would benefit from Palliative Care services at the SNF, please include order for Palliative Care in discharge orders..  Patient's home medication list  And transfer summary in place on shadow chart.   Please call [facility] @ [phone]- with any hospice needs.   Thank you. Tracey Harries, RN  [facility]  Hospice Liaison  [phone])

## 2013-08-22 MED ORDER — PANTOPRAZOLE SODIUM 40 MG PO TBEC: 40.0000 mg | DELAYED_RELEASE_TABLET | Freq: Every day | ORAL | Status: AC

## 2013-08-22 MED ORDER — CIPROFLOXACIN HCL 0.3 % OP SOLN: 2.0000 [drp] | OPHTHALMIC | Status: AC

## 2013-08-22 MED ORDER — ENSURE COMPLETE PO LIQD: 237.0000 mL | Freq: Two times a day (BID) | ORAL | Status: AC

## 2013-08-22 MED ORDER — LEVOFLOXACIN 750 MG PO TABS: 750.0000 mg | ORAL_TABLET | Freq: Every day | ORAL | Status: AC

## 2013-08-22 MED ORDER — NAPHAZOLINE HCL 0.1 % OP SOLN: 1.0000 [drp] | Freq: Four times a day (QID) | OPHTHALMIC | Status: AC | PRN

## 2013-08-22 NOTE — Progress Notes (Signed)
Emptied 568mL tea colored urine via in and out cath.  Will continue to monitor. Syliva Overman

## 2013-08-22 NOTE — Progress Notes (Signed)
Pt. C/o "not being able to pee."  Got pt. Up to [facility] and still unable to void.  Bladder scan and received scan of >572.  MD paged.  Orders to in and out cath.  MD states pt. Still stable for discharge. Syliva Overman

## 2013-08-22 NOTE — Progress Notes (Signed)
Clinical Social Worker (CSW) contacted patient's son Jaikob Borgwardt [phone] and gave bed offers. Son chose [facility]. CSW contacted Marta Lamas at [facility] who reported that patient can come this weekend if he is stable for D/C. Gerald Stabs reported that patient's son can complete paper work on Monday. CSW will continue to follow and assist with D/C.   Blima Rich, [place] Weekend CSW [phone]

## 2013-08-22 NOTE — Progress Notes (Signed)
[facility] [place] Work note LCSW made visit to follow up on patient discharge plan. Patient is going to nursing facility today and he revoked his hospice medicare benefit to utilize skilled days and pay for charges at facility. LCSW reviewed paperwork and patient signed. LCSW called his son and left a message regarding events at visit. Patient appeared comfortable with no pain, yet expresses inability to urinate. He tried several times with urinal. RN was notified and offered assistance. LCSW contacted weekend SW, Mel Almond and left message to contact to confirm discharge, yet RN did. Katherina Right, LCSW

## 2013-08-22 NOTE — Progress Notes (Signed)
Patient is medically stable for D/C to [facility] today. Per Gerald Stabs RN at facility they can accept the patient today. Clinical Education officer, museum (CSW) faxed D/C summary to [facility], prepared D/C packet, and arranged EMS for transport. Patient's son is aware and RN will call son when EMS arrives. Patient and nursing are aware of above. Please reconsult if further social work needs arise. CSW signing off.   Blima Rich, [place] Weekend CSW [phone]

## 2013-08-22 NOTE — Progress Notes (Signed)
Clinical Social Work Department BRIEF PSYCHOSOCIAL ASSESSMENT 08/22/2013  Patient:  Danny Waters, Danny Waters     Account Number:  [id number]     Admit date:  08/19/2013  Clinical Social Worker:  Rolinda Roan  Date/Time:  08/22/2013 02:18 PM  Referred by:  Physician  Date Referred:  08/21/2013 Referred for  SNF Placement   Other Referral:   Interview type:  Family Other interview type:    PSYCHOSOCIAL DATA Living Status:  ALONE Admitted from facility:   Level of care:   Primary support name:  Zaryan Yakubov [phone] Primary support relationship to patient:  CHILD, ADULT Degree of support available:   Very supportive.    CURRENT CONCERNS  Other Concerns:    SOCIAL WORK ASSESSMENT / PLAN Clinical Social Worker (CSW) contacted patient's son Danny Waters to complete assessment. Son reported that patient was at [facility] but they D/C'ed him home because his health improved. Son reported that he told [facility] that patient would not do well at home alone however patient was sent home. Son reported that patient needs long term care at a skilled nursing facility. CSW encouraged son to apply for medicaid for patient and work with the social worker and admissions coordinator at the facility to transition patient to long term care. Son verbalized his understanding.   Assessment/plan status:  Psychosocial Support/Ongoing Assessment of Needs Other assessment/ plan:   Information/referral to community resources:    PATIENT'S/FAMILY'S RESPONSE TO PLAN OF CARE: Son thanked CSW for calling and healing during this stressful time. CSW provided emotional support to son while explained that he has been through a lot with his mother and now his father. CSW encouraged son to take care of himself.

## 2013-08-22 NOTE — Progress Notes (Signed)
Called report to RN, Gerald Stabs at [facility].  Gerald Stabs denies any questions but told to call back if she has any.  IV removed by NT.  Pt. In stable condition.  Will call son when Corey Harold has arrived. Syliva Overman

## 2013-08-22 NOTE — Progress Notes (Signed)
Clinical Social Work Department CLINICAL SOCIAL WORK PLACEMENT NOTE 08/22/2013  Patient:  Danny Waters, Danny Waters  Account Number:  [id number] Admit date:  08/19/2013  Clinical Social Worker:  Blima Rich, Latanya Presser  Date/time:  08/22/2013 02:26 PM  Clinical Social Work is seeking post-discharge placement for this patient at the following level of care:   [place]   (*CSW will update this form in Epic as items are completed)   08/21/2013  Patient/family provided with [facility] Department of Clinical Social Work's list of facilities offering this level of care within the geographic area requested by the patient (or if unable, by the patient's family).  08/21/2013  Patient/family informed of their freedom to choose among providers that offer the needed level of care, that participate in Medicare, Medicaid or managed care program needed by the patient, have an available bed and are willing to accept the patient.  08/21/2013  Patient/family informed of [facility]' ownership interest in [facility], as well as of the fact that they are under no obligation to receive care at this facility.  PASARR submitted to EDS on  PASARR number received from [place] on   FL2 transmitted to all facilities in geographic area requested by pt/family on  08/21/2013 FL2 transmitted to all facilities within larger geographic area on   Patient informed that his/her managed care company has contracts with or will negotiate with  certain facilities, including the following:     Patient/family informed of bed offers received:   Patient chooses bed at  Physician recommends and patient chooses bed at    Patient to be transferred to [facility] on  08/22/2013 Patient to be transferred to facility by [facility]  The following physician request were entered in Epic:   Additional Comments: [facility] was down on Saturday 08/22/13. Patient's PASARR was on the FL2.

## 2013-08-22 NOTE — Progress Notes (Signed)
Pt.'s son called to let know his dad was on the way to [facility] Syliva Overman

## 2013-08-22 NOTE — Discharge Summary (Addendum)
Danny Waters, is a 80 y.o. male  DOB [birth year]  MRN [id number].  Admission date:  08/19/2013  Admitting Physician  Rise Patience, MD  Discharge Date:  08/22/2013   Primary MD  Florina Ou, MD  Recommendations for primary care physician for things to follow:  Supportive care.  Admission Diagnosis  Weakness [780.79] UTI (lower urinary tract infection) [599.0] Fall [E888.9]   Discharge Diagnosis  Weakness [780.79] UTI (lower urinary tract infection) [599.0] Fall [E888.9]    Active Problems:   Malignant melanoma   Aortic stenosis   CAD (coronary artery disease)   Hyperlipidemia   Hypertensive heart disease   Type 2 diabetes mellitus with vascular disease   Pancreatic cancer metastasized to liver   Protein-calorie malnutrition, severe  History of present illness and  Hospital Course:    Kindly see H&P for history of present illness and admission details, please review complete Labs, Consult reports and Test reports for all details. In brief, Danny Waters is a pleasant 80 y.o. male with metastatic pancreatic cancer, history of PE, CAD, and diabetes mellitus off medications, who was brought to the ED after patient was found on the floor by his son. Patient's son stated that patient was discharged from hospice home 2 weeks ago and he lived in his home alone. The son's and everybody's concern was that patient was getting weaker and would not be able to manage by himself at home. Regardless, patient was found to have a UTI with a white count of 18,000, and some rhabdomyolysis resulting from being on the floor for a while. He had been feeling weak over the last few days prior to the admission. A CT scan of the head and neck done at presentation did not show anything acute except for soft tissue swelling. Eventually, I spoke with  patient's son over the phone and discussed the care plan including disposition options. I also spoke with palliative care, Dr. Kemper Durie care, appreciate help), with decision made for patient to be referred to skilled nursing facility for now, to be followed by palliative care there. Patient is not at a place where he needs to be in hospice home. He has made some improvement with treatment for UTI/rhabdomyolysis. Patient will be discharged to skilled nursing facility once arrangements have been made, likely today. He is DNR/DNI. He is at high risk for sudden demise from severe aortic stenosis. He should complete a few more days of Levaquin for the UTI.  Past Medical History  Diagnosis Date  . Hyperlipidemia     takes Lipitor daily  . Hypertension     takes Amlodipine,Metoprolol,and Losartan daily  . Hx of colonic polyps   . Type 2 diabetes mellitus with vascular disease   . Aortic stenosis     Last ECHO Dr. Wynonia Lawman 09/24/12  AVA 0.76 assymptomatic   . CAD (coronary artery disease) 09/24/2012    Cath 07/04/2002 normal Left main, 50% stenosis proximal LAD, 99% stenosis mid LAD, 50% stenosis mid CFX, 60% stenosis proximal PDA, stent  placed mid LAD  Taxus stent to mid LAD 07/04/2002   . Hypertensive heart disease   . Pancreatic cancer     metastatic, tx by Dr. Julien Nordmann  . Malignant melanoma     Past Surgical History  Procedure Laterality Date  . [place]  2003  . Hernia repair  1957  . Appendectomy    . Cardiac catheterization  10+yrs ago  . Coronary stent placement  2004    Taxus stent to mid LAD  . Colonoscopy    . Melanoma excision  06/07/2011    Procedure: MELANOMA EXCISION;  Surgeon: Harl Bowie, MD;  Location: [facility];  Service: General;  Laterality: Right;  wide excision melanoma right shoulder  and left back and left upper arm   . Mastoid debridement       Discharge Condition: Stable as it could be.   Follow UP With Physician SNIF     Discharge  Instructions  and  Discharge Medications    Discharge Orders   Future Orders Complete By Expires   Diet - low sodium heart healthy  As directed    Increase activity slowly  As directed        Medication List         aspirin 81 MG tablet  Take 81 mg by mouth daily.     ciprofloxacin 0.3 % ophthalmic solution  Commonly known as:  CILOXAN  Place 2 drops into the left eye every 4 (four) hours while awake. Administer 1 drop, every 2 hours, while awake, for 2 days. Then 1 drop, every 4 hours, while awake, for the next 5 days.     feeding supplement (ENSURE COMPLETE) Liqd  Take 237 mLs by mouth 2 (two) times daily between meals.     levofloxacin 750 MG tablet  Commonly known as:  LEVAQUIN  Take 1 tablet (750 mg total) by mouth daily.     naphazoline 0.1 % ophthalmic solution  Commonly known as:  NAPHCON  Place 1 drop into both eyes 4 (four) times daily as needed for irritation.     pantoprazole 40 MG tablet  Commonly known as:  PROTONIX  Take 1 tablet (40 mg total) by mouth daily at 12 noon.          Diet and Activity recommendation: See Discharge Instructions above   Consults obtained - Palliative care   Major procedures and Radiology Reports - PLEASE review detailed and final reports for all details, in brief -   Dg Chest 2 View  08/20/2013   CLINICAL DATA:  Shortness of breath and back pain  EXAM: CHEST  2 VIEW  COMPARISON:  Prior radiograph from 07/16/2013  FINDINGS: Right-sided Port-A-Cath again noted in. Cardiomegaly is stable. Atherosclerotic calcifications present within the aortic arch.  The lungs are normally inflated. On lateral projection, patchy opacity is seen within the posterior aspect of the lungs. This appears to correspond with the retrocardiac left lower lobe on frontal projection. This opacity is not seen on recent radiograph from 07/16/2013. Finding may reflect atelectasis or infiltrate. No pulmonary edema or pleural effusion. No pneumothorax.  No acute  osseous abnormality identified. Multilevel degenerative changes noted within the visualized spine  IMPRESSION: 1. Patchy opacity within the posterior left lower lobe. While this finding may reflect atelectasis, possible infiltrate could be considered in the correct clinical setting. 2. Stable cardiomegaly without pulmonary edema.   Electronically Signed   By: Jeannine Boga M.D.   On: 08/20/2013 04:18   Dg  Lumbar Spine Complete  08/20/2013   CLINICAL DATA:  Status post fall.  Lower back pain.  EXAM: LUMBAR SPINE - COMPLETE 4+ VIEW  COMPARISON:  Abdominal radiograph performed 06/25/2012, and CT of the abdomen from 07/14/2013  FINDINGS: There is no evidence of fracture or subluxation. Vertebral bodies demonstrate normal height and alignment. There is minimal narrowing of the intervertebral disc space at L4-L5. Mild facet disease is noted at the lower lumbar spine. Slight anterior wedging of vertebral body T12 is likely developmental in nature.  The visualized bowel gas pattern is unremarkable in appearance; air and stool are noted within the colon. The sacroiliac joints are within normal limits. Scattered calcification is seen along the abdominal aorta and its branches.  IMPRESSION: 1. No evidence of acute fracture or subluxation along the lumbar spine. 2. Minimal degenerative change at the lower lumbar spine. 3. Scattered vascular calcifications seen.   Electronically Signed   By: Garald Balding M.D.   On: 08/20/2013 00:26   Ct Head Wo Contrast  08/20/2013   CLINICAL DATA:  Status post fall. Patient found on floor after 13 hours. Altered mental status. Left-sided scleral hemorrhage and small abrasion at the back of the head. Concern for cervical spine injury.  EXAM: CT HEAD WITHOUT CONTRAST  CT CERVICAL SPINE WITHOUT CONTRAST  TECHNIQUE: Multidetector CT imaging of the head and cervical spine was performed following the standard protocol without intravenous contrast. Multiplanar CT image reconstructions  of the cervical spine were also generated.  COMPARISON:  CT of the head performed 07/16/2013  FINDINGS: CT HEAD FINDINGS  There is no evidence of acute infarction, mass lesion, or intra- or extra-axial hemorrhage on CT.  The posterior fossa, including the cerebellum, brainstem and fourth ventricle, is within normal limits. The third and lateral ventricles, and basal ganglia are unremarkable in appearance. The cerebral hemispheres are symmetric in appearance, with normal gray-white differentiation. No mass effect or midline shift is seen.  There is no evidence of fracture; visualized osseous structures are unremarkable in appearance. The visualized portions of the orbits are within normal limits. The paranasal sinuses and mastoid air cells are well-aerated. Mild soft tissue swelling is noted overlying the parietal calvarium bilaterally.  CT CERVICAL SPINE FINDINGS  There is no evidence of fracture or subluxation. Vertebral bodies demonstrate normal height and alignment. Intervertebral disc spaces are preserved. Prevertebral soft tissues are within normal limits. A small posterior disc osteophyte complex is noted at C3-C4.  The visualized portions of the thyroid gland are unremarkable in appearance, aside from a partially characterized focus of calcification at the right thyroid lobe. Minimal scarring is noted at the right lung apex. Calcification is noted along the carotid bifurcations bilaterally, with likely mild luminal narrowing along the proximal right internal carotid artery.  IMPRESSION: 1. No evidence of traumatic intracranial injury or fracture. 2. No evidence of fracture or subluxation along the cervical spine. 3. Mild soft tissue swelling noted overlying the parietal calvarium bilaterally. 4. Minimal scarring at the right lung apex. 5. Calcification along the carotid bifurcations bilaterally, with likely mild luminal narrowing along the proximal right internal carotid artery. Carotid ultrasound would be  helpful for further evaluation, when and as deemed clinically appropriate.   Electronically Signed   By: Garald Balding M.D.   On: 08/20/2013 00:49   Ct Cervical Spine Wo Contrast  08/20/2013   CLINICAL DATA:  Status post fall. Patient found on floor after 13 hours. Altered mental status. Left-sided scleral hemorrhage and small abrasion at the  back of the head. Concern for cervical spine injury.  EXAM: CT HEAD WITHOUT CONTRAST  CT CERVICAL SPINE WITHOUT CONTRAST  TECHNIQUE: Multidetector CT imaging of the head and cervical spine was performed following the standard protocol without intravenous contrast. Multiplanar CT image reconstructions of the cervical spine were also generated.  COMPARISON:  CT of the head performed 07/16/2013  FINDINGS: CT HEAD FINDINGS  There is no evidence of acute infarction, mass lesion, or intra- or extra-axial hemorrhage on CT.  The posterior fossa, including the cerebellum, brainstem and fourth ventricle, is within normal limits. The third and lateral ventricles, and basal ganglia are unremarkable in appearance. The cerebral hemispheres are symmetric in appearance, with normal gray-white differentiation. No mass effect or midline shift is seen.  There is no evidence of fracture; visualized osseous structures are unremarkable in appearance. The visualized portions of the orbits are within normal limits. The paranasal sinuses and mastoid air cells are well-aerated. Mild soft tissue swelling is noted overlying the parietal calvarium bilaterally.  CT CERVICAL SPINE FINDINGS  There is no evidence of fracture or subluxation. Vertebral bodies demonstrate normal height and alignment. Intervertebral disc spaces are preserved. Prevertebral soft tissues are within normal limits. A small posterior disc osteophyte complex is noted at C3-C4.  The visualized portions of the thyroid gland are unremarkable in appearance, aside from a partially characterized focus of calcification at the right thyroid  lobe. Minimal scarring is noted at the right lung apex. Calcification is noted along the carotid bifurcations bilaterally, with likely mild luminal narrowing along the proximal right internal carotid artery.  IMPRESSION: 1. No evidence of traumatic intracranial injury or fracture. 2. No evidence of fracture or subluxation along the cervical spine. 3. Mild soft tissue swelling noted overlying the parietal calvarium bilaterally. 4. Minimal scarring at the right lung apex. 5. Calcification along the carotid bifurcations bilaterally, with likely mild luminal narrowing along the proximal right internal carotid artery. Carotid ultrasound would be helpful for further evaluation, when and as deemed clinically appropriate.   Electronically Signed   By: Garald Balding M.D.   On: 08/20/2013 00:49   Dg Shoulder Left  08/20/2013   CLINICAL DATA:  Status post fall; left shoulder pain.  EXAM: LEFT SHOULDER - 2+ VIEW  COMPARISON:  None.  FINDINGS: There is no evidence of fracture or dislocation. The left humeral head is seated within the glenoid fossa. The acromioclavicular joint is unremarkable in appearance. No significant soft tissue abnormalities are seen. The visualized portions of the left lung are clear.  IMPRESSION: No evidence of fracture or dislocation.   Electronically Signed   By: Garald Balding M.D.   On: 08/20/2013 00:24    Micro Results    Recent Results (from the past 240 hour(s))  URINE CULTURE     Status: None   Collection Time    08/20/13  1:00 AM      Result Value Ref Range Status   Specimen Description URINE, CLEAN CATCH   Final   Special Requests NONE   Final   Culture  Setup Time     Final   Value: 08/20/2013 00:51     Performed at [facility]     Final   Value: NO GROWTH     Performed at [facility]   Culture     Final   Value: NO GROWTH     Performed at [facility]   Report Status 08/21/2013 FINAL   Final  Today    Subjective:   Danny Waters today has no headache,no chest abdominal pain,no new weakness tingling or numbness, feels much better wants to go home today.   Objective:   Blood pressure 128/72, pulse 82, temperature 98.2 F (36.8 C), temperature source Oral, resp. rate 17, height 5\' 11"  (1.803 m), weight 69.899 kg (154 lb 1.6 oz), SpO2 96.00%.   Intake/Output Summary (Last 24 hours) at 08/22/13 0920 Last data filed at 08/22/13 0500  Gross per 24 hour  Intake    220 ml  Output    300 ml  Net    -80 ml    Exam Awake Alert, Oriented *3, No new F.N deficits, Normal affect [place].AT,PERRAL Supple Neck,No JVD, No cervical lymphadenopathy appriciated.  Symmetrical Chest wall movement, Good air movement bilaterally, CTAB RRR,No Gallops,Rubs or new Murmurs, No Parasternal Heave +ve B.Sounds, Abd Soft, Non tender, No organomegaly appriciated, No rebound -guarding or rigidity. No Cyanosis, Clubbing or edema, No new Rash or bruise  Data Review   CBC w Diff: Lab Results  Component Value Date   WBC 15.4* 08/20/2013   WBC 6.1 07/14/2013   HGB 12.6* 08/20/2013   HGB 11.1* 07/14/2013   HCT 39.4 08/20/2013   HCT 33.7* 07/14/2013   PLT 200 08/20/2013   PLT 135* 07/14/2013   LYMPHOPCT 4* 08/19/2013   LYMPHOPCT 21.3 07/14/2013   MONOPCT 5 08/19/2013   MONOPCT 8.2 07/14/2013   EOSPCT 0 08/19/2013   EOSPCT 1.1 07/14/2013   BASOPCT 0 08/19/2013   BASOPCT 0.4 07/14/2013    CMP: Lab Results  Component Value Date   NA 142 08/20/2013   NA 139 07/14/2013   K 5.0 08/20/2013   K 3.2* 07/14/2013   CL 102 08/20/2013   CL 94* 06/25/2012   CO2 29 07/17/2013   CO2 25 07/14/2013   BUN 26* 08/20/2013   BUN 9.4 07/14/2013   CREATININE 0.80 08/20/2013   CREATININE 0.7 07/14/2013   PROT 6.3 08/19/2013   PROT 5.8* 07/14/2013   ALBUMIN 2.7* 08/19/2013   ALBUMIN 2.8* 07/14/2013   BILITOT 3.9* 08/19/2013   BILITOT 1.34* 07/14/2013   ALKPHOS 617* 08/19/2013   ALKPHOS 500* 07/14/2013   AST 39* 08/19/2013   AST 26 07/14/2013   ALT  24 08/19/2013   ALT 23 07/14/2013  .   Total Time in preparing paper work, data evaluation and todays exam - 35 minutes  Nat Math M.D on 08/22/2013 at 9:20 AM  [place]  [phone]

## 2013-08-22 NOTE — Progress Notes (Signed)
Related admission to [facility] diagnosis of pancreatic cancer.  DNR on chart.  Found patient awaiting discharge to be placed at [facility].  Per chart, patient's son is aware of the patient's discharge and placement at [facility].  Anticipate discharge from Hospice services upon placement at [facility].  Encourage a call to Hospice with any needs at [phone].  April Vinetta Bergamo, RN, BSN Hospice

## 2013-08-25 ENCOUNTER — Encounter: Payer: Self-pay | Admitting: Internal Medicine

## 2013-08-25 ENCOUNTER — Non-Acute Institutional Stay (SKILLED_NURSING_FACILITY): Payer: Medicare Other | Admitting: Internal Medicine

## 2013-08-25 DIAGNOSIS — W19XXXA Unspecified fall, initial encounter: Secondary | ICD-10-CM

## 2013-08-25 DIAGNOSIS — I798 Other disorders of arteries, arterioles and capillaries in diseases classified elsewhere: Secondary | ICD-10-CM

## 2013-08-25 DIAGNOSIS — C259 Malignant neoplasm of pancreas, unspecified: Secondary | ICD-10-CM

## 2013-08-25 DIAGNOSIS — C439 Malignant melanoma of skin, unspecified: Secondary | ICD-10-CM

## 2013-08-25 DIAGNOSIS — I350 Nonrheumatic aortic (valve) stenosis: Secondary | ICD-10-CM

## 2013-08-25 DIAGNOSIS — E1159 Type 2 diabetes mellitus with other circulatory complications: Secondary | ICD-10-CM

## 2013-08-25 DIAGNOSIS — I119 Hypertensive heart disease without heart failure: Secondary | ICD-10-CM

## 2013-08-25 DIAGNOSIS — I2699 Other pulmonary embolism without acute cor pulmonale: Secondary | ICD-10-CM

## 2013-08-25 DIAGNOSIS — E43 Unspecified severe protein-calorie malnutrition: Secondary | ICD-10-CM

## 2013-08-25 DIAGNOSIS — I359 Nonrheumatic aortic valve disorder, unspecified: Secondary | ICD-10-CM

## 2013-08-25 DIAGNOSIS — C787 Secondary malignant neoplasm of liver and intrahepatic bile duct: Secondary | ICD-10-CM

## 2013-08-25 DIAGNOSIS — N390 Urinary tract infection, site not specified: Secondary | ICD-10-CM

## 2013-08-25 DIAGNOSIS — Y92009 Unspecified place in unspecified non-institutional (private) residence as the place of occurrence of the external cause: Secondary | ICD-10-CM

## 2013-08-25 DIAGNOSIS — I2510 Atherosclerotic heart disease of native coronary artery without angina pectoris: Secondary | ICD-10-CM

## 2013-08-25 DIAGNOSIS — W19XXXD Unspecified fall, subsequent encounter: Secondary | ICD-10-CM

## 2013-08-25 DIAGNOSIS — Z5189 Encounter for other specified aftercare: Secondary | ICD-10-CM

## 2013-08-25 NOTE — Progress Notes (Signed)
Patient ID: Danny Waters, male   DOB: [birth year], 80 y.o.   MRN: [id number]  Provider:  Rexene Edison. Mariea Waters, D.O., C.M.D.  Location:  [place]  PCP: Florina Ou, MD  Code Status: DNR;  Was on hospice care at [facility], but d/c'd home after 2 wks on hospice with [facility], now here for palliative care on skilled benefit  No Known Allergies  Chief Complaint  Patient presents with  . Hospitalization Follow-up    new admission for palliative care after hospitalization with fall and UTI;  has pancreatic ca with mets to his liver, malignant melanoma and severe aortic stenosis    HPI: 80 y.o. male with h/o DMII with vascular complications, hypertensive heart disease, CAD and severe aortic stenosis was admitted here after his most recent hospitalization for fall and UTI.  [name] has been following with oncology due to metastatic pancreatic cancer to the liver and melanoma.  He was recently hospitalized with an acute PE and DVT.  He was then discharged to [facility].  He stayed there a couple of weeks, and then went home with [facility] hospice care.  He was living at home alone when he sustained a fall (did not recall event).  He was found to have a UTI and is now completing levaquin therapy thru 08/27/13.  He is also noted to have severe protein calorie malnutrition.    ROS: Review of Systems  Constitutional: Positive for weight loss and malaise/fatigue. Negative for fever and chills.  HENT: Negative for congestion.   Eyes: Negative for blurred vision.  Respiratory: Positive for shortness of breath.   Cardiovascular: Positive for leg swelling. Negative for chest pain.  Gastrointestinal: Positive for abdominal pain. Negative for blood in stool and melena.  Musculoskeletal: Positive for myalgias and falls.  Skin: Negative for rash.  Neurological: Positive for weakness. Negative for dizziness.  Endo/Heme/Allergies: Bruises/bleeds easily.    Past Medical History  Diagnosis Date  .  Hyperlipidemia     takes Lipitor daily  . Hypertension     takes Amlodipine,Metoprolol,and Losartan daily  . Hx of colonic polyps   . Type 2 diabetes mellitus with vascular disease   . Aortic stenosis     Last ECHO Dr. Wynonia Lawman 09/24/12  AVA 0.76 assymptomatic   . CAD (coronary artery disease) 09/24/2012    Cath 07/04/2002 normal Left main, 50% stenosis proximal LAD, 99% stenosis mid LAD, 50% stenosis mid CFX, 60% stenosis proximal PDA, stent placed mid LAD  Taxus stent to mid LAD 07/04/2002   . Hypertensive heart disease   . Pancreatic cancer     metastatic, tx by Dr. Julien Nordmann  . Malignant melanoma    Past Surgical History  Procedure Laterality Date  . [facility]  2003  . Hernia repair  1957  . Appendectomy    . Cardiac catheterization  10+yrs ago  . Coronary stent placement  2004    Taxus stent to mid LAD  . Colonoscopy    . Melanoma excision  06/07/2011    Procedure: MELANOMA EXCISION;  Surgeon: Harl Bowie, MD;  Location: [place];  Service: General;  Laterality: Right;  wide excision melanoma right shoulder  and left back and left upper arm   . Mastoid debridement     Social History:   reports that he quit smoking about 55 years ago. His smoking use included Cigarettes. He has a 20 pack-year smoking history. He has never used smokeless tobacco. He reports that he drinks alcohol. He  reports that he does not use illicit drugs.  Family History  Problem Relation Age of Onset  . Heart disease Mother   . Hypertension Mother   . Anesthesia problems Neg Hx   . Hypotension Neg Hx   . Malignant hyperthermia Neg Hx   . Pseudochol deficiency Neg Hx   . Hypertension Father     Medications: Patient's Medications  New Prescriptions   No medications on file  Previous Medications   ASPIRIN 81 MG TABLET    Take 81 mg by mouth daily.   CIPROFLOXACIN (CILOXAN) 0.3 % OPHTHALMIC SOLUTION    Place 2 drops into the left eye every 4 (four) hours while awake. Administer 1 drop, every 2  hours, while awake, for 2 days. Then 1 drop, every 4 hours, while awake, for the next 5 days.   FEEDING SUPPLEMENT, ENSURE COMPLETE, (ENSURE COMPLETE) LIQD    Take 237 mLs by mouth 2 (two) times daily between meals.   LEVOFLOXACIN (LEVAQUIN) 750 MG TABLET    Take 1 tablet (750 mg total) by mouth daily.   NAPHAZOLINE (NAPHCON) 0.1 % OPHTHALMIC SOLUTION    Place 1 drop into both eyes 4 (four) times daily as needed for irritation.   PANTOPRAZOLE (PROTONIX) 40 MG TABLET    Take 1 tablet (40 mg total) by mouth daily at 12 noon.  Modified Medications   No medications on file  Discontinued Medications   No medications on file     Physical Exam: Filed Vitals:   08/25/13 1129  BP: 121/78  Pulse: 57  Temp: 97.2 F (36.2 C)  Resp: 20  Height: 5\' 11"  (1.803 m)  Weight: 154 lb 9.6 oz (70.126 kg)  SpO2: 95%  Physical Exam  Constitutional: He is oriented to person, place, and time. No distress.  Thin white male, nad resting in bed  HENT:  Head: Normocephalic and atraumatic.  Right Ear: External ear normal.  Left Ear: External ear normal.  Nose: Nose normal.  Mouth/Throat: Oropharynx is clear and moist.  Eyes: Conjunctivae and EOM are normal. Pupils are equal, round, and reactive to light.  glasses  Neck: Neck supple. No JVD present.  Cardiovascular: Normal rate, regular rhythm, normal heart sounds and intact distal pulses.   Pulmonary/Chest: Effort normal and breath sounds normal. No respiratory distress.  Abdominal: Soft. Bowel sounds are normal. He exhibits no distension and no mass. There is tenderness.  Musculoskeletal: Normal range of motion.  Neurological: He is alert and oriented to person, place, and time.  Skin: Skin is warm and dry. There is pallor.  Psychiatric: He has a normal mood and affect.  Very pleasant    Labs reviewed: Basic Metabolic Panel:  Recent Labs  03/28/13 2128 03/29/13 0138  07/14/13 1307 07/16/13 1338 07/17/13 0403 08/20/13 0001 08/20/13 0835  NA  129* 137  < > 139 144 145 142  --   K 3.5 3.3*  < > 3.2* 3.6* 4.0 5.0  --   CL 86* 94*  < >  --  106 105 102  --   CO2 31 31  < > 25 27 29   --   --   GLUCOSE 524* 238*  < > 104 126* 110* 156*  --   BUN 19 16  < > 9.4 11 11  26*  --   CREATININE 0.99 0.84  < > 0.7 0.92 0.73 1.20 0.80  CALCIUM 9.8 10.1  < > 9.3 9.4 9.8  --   --   MG  --  1.7  --   --   --   --   --   --   PHOS  --  2.1*  --   --   --   --   --   --   < > = values in this interval not displayed. Liver Function Tests:  Recent Labs  07/14/13 1307 07/16/13 1338 08/19/13 2353  AST 26 22 39*  ALT 23 20 24   ALKPHOS 500* 437* 617*  BILITOT 1.34* 1.1 3.9*  PROT 5.8* 5.5* 6.3  ALBUMIN 2.8* 2.6* 2.7*   No results found for this basename: LIPASE, AMYLASE,  in the last 8760 hours  Recent Labs  03/28/13 2128  AMMONIA 28   CBC:  Recent Labs  07/07/13 1113 07/14/13 1307  07/18/13 0506 08/19/13 2353 08/20/13 0001 08/20/13 0835  WBC 5.4 6.1  < > 6.5 18.9*  --  15.4*  NEUTROABS 3.1 4.2  --   --  17.1*  --   --   HGB 11.1* 11.1*  < > 10.2* 13.7 14.6 12.6*  HCT 33.9* 33.7*  < > 31.9* 41.7 43.0 39.4  MCV 98.0 98.9*  < > 100.0 99.8  --  101.8*  PLT 90* 135*  < > 159 222  --  200  < > = values in this interval not displayed. Cardiac Enzymes:  Recent Labs  03/29/13 0138 03/29/13 0655 03/29/13 1259 08/19/13 2353  CKTOTAL  --   --   --  456*  TROPONINI <0.30 <0.30 <0.30  --    BNP: No components found with this basename: POCBNP,  CBG:  Recent Labs  07/17/13 2136 07/18/13 0742 07/18/13 1143  GLUCAP 136* 92 118*    Imaging and Procedures: Dg Chest 2 View  08/20/2013 CLINICAL DATA: Shortness of breath and back pain EXAM: CHEST 2 VIEW COMPARISON: Prior radiograph from 07/16/2013 FINDINGS: Right-sided Port-A-Cath again noted in. Cardiomegaly is stable. Atherosclerotic calcifications present within the aortic arch. The lungs are normally inflated. On lateral projection, patchy opacity is seen within the posterior  aspect of the lungs. This appears to correspond with the retrocardiac left lower lobe on frontal projection. This opacity is not seen on recent radiograph from 07/16/2013. Finding may reflect atelectasis or infiltrate. No pulmonary edema or pleural effusion. No pneumothorax. No acute osseous abnormality identified. Multilevel degenerative changes noted within the visualized spine IMPRESSION: 1. Patchy opacity within the posterior left lower lobe. While this finding may reflect atelectasis, possible infiltrate could be considered in the correct clinical setting. 2. Stable cardiomegaly without pulmonary edema. Electronically Signed By: Jeannine Boga M.D. On: 08/20/2013 04:18  Dg Lumbar Spine Complete   08/20/2013 CLINICAL DATA: Status post fall. Lower back pain. EXAM: LUMBAR SPINE - COMPLETE 4+ VIEW COMPARISON: Abdominal radiograph performed 06/25/2012, and CT of the abdomen from 07/14/2013 FINDINGS: There is no evidence of fracture or subluxation. Vertebral bodies demonstrate normal height and alignment. There is minimal narrowing of the intervertebral disc space at L4-L5. Mild facet disease is noted at the lower lumbar spine. Slight anterior wedging of vertebral body T12 is likely developmental in nature. The visualized bowel gas pattern is unremarkable in appearance; air and stool are noted within the colon. The sacroiliac joints are within normal limits. Scattered calcification is seen along the abdominal aorta and its branches. IMPRESSION: 1. No evidence of acute fracture or subluxation along the lumbar spine. 2. Minimal degenerative change at the lower lumbar spine. 3. Scattered vascular calcifications seen. Electronically Signed By: Garald Balding M.D. On: 08/20/2013  00:26   Ct Cervical Spine Wo Contrast  08/20/2013 CLINICAL DATA: Status post fall. Patient found on floor after 13 hours. Altered mental status. Left-sided scleral hemorrhage and small abrasion at the back of the head. Concern for cervical  spine injury. EXAM: CT HEAD WITHOUT CONTRAST CT CERVICAL SPINE WITHOUT CONTRAST TECHNIQUE: Multidetector CT imaging of the head and cervical spine was performed following the standard protocol without intravenous contrast. Multiplanar CT image reconstructions of the cervical spine were also generated. COMPARISON: CT of the head performed 07/16/2013 FINDINGS: CT HEAD FINDINGS There is no evidence of acute infarction, mass lesion, or intra- or extra-axial hemorrhage on CT. The posterior fossa, including the cerebellum, brainstem and fourth ventricle, is within normal limits. The third and lateral ventricles, and basal ganglia are unremarkable in appearance. The cerebral hemispheres are symmetric in appearance, with normal gray-white differentiation. No mass effect or midline shift is seen. There is no evidence of fracture; visualized osseous structures are unremarkable in appearance. The visualized portions of the orbits are within normal limits. The paranasal sinuses and mastoid air cells are well-aerated. Mild soft tissue swelling is noted overlying the parietal calvarium bilaterally. CT CERVICAL SPINE FINDINGS There is no evidence of fracture or subluxation. Vertebral bodies demonstrate normal height and alignment. Intervertebral disc spaces are preserved. Prevertebral soft tissues are within normal limits. A small posterior disc osteophyte complex is noted at C3-C4. The visualized portions of the thyroid gland are unremarkable in appearance, aside from a partially characterized focus of calcification at the right thyroid lobe. Minimal scarring is noted at the right lung apex. Calcification is noted along the carotid bifurcations bilaterally, with likely mild luminal narrowing along the proximal right internal carotid artery. IMPRESSION: 1. No evidence of traumatic intracranial injury or fracture. 2. No evidence of fracture or subluxation along the cervical spine. 3. Mild soft tissue swelling noted overlying the  parietal calvarium bilaterally. 4. Minimal scarring at the right lung apex. 5. Calcification along the carotid bifurcations bilaterally, with likely mild luminal narrowing along the proximal right internal carotid artery. Carotid ultrasound would be helpful for further evaluation, when and as deemed clinically appropriate. Electronically Signed By: Garald Balding M.D. On: 08/20/2013 00:49   Dg Shoulder Left  08/20/2013 CLINICAL DATA: Status post fall; left shoulder pain. EXAM: LEFT SHOULDER - 2+ VIEW COMPARISON: None. FINDINGS: There is no evidence of fracture or dislocation. The left humeral head is seated within the glenoid fossa. The acromioclavicular joint is unremarkable in appearance. No significant soft tissue abnormalities are seen. The visualized portions of the left lung are clear. IMPRESSION: No evidence of fracture or dislocation. Electronically Signed By: Garald Balding M.D. On: 08/20/2013 00:24   Assessment/Plan 1. Pancreatic cancer metastasized to liver -was following with Dr. Julien Nordmann -is to be followed by palliative care here (on skilled benefit) -was unable to safely live at home alone any longer after his fall -had been on hospice at [facility], then at home -comfort care, avoid hospitalizations  2. Malignant melanoma -had excision previously  3. Protein-calorie malnutrition, severe -2 cal supplement 240cc bid between meals  4. Type 2 diabetes mellitus with vascular disease -will liberalize diet with goals of care being comfort and quality not quantity at this point -avoid cbgs   5. Hypertensive heart disease - no longer on aggressive regimen for this but bp still at goal  6. Aortic stenosis - severe on echo 2014, but said to be asymptomatic   7. CAD (coronary artery disease) -no longer on preventative medications  8. Acute pulmonary embolism with DVT - due to malignancy and immobility -not on treatment due to goals of care at this time -sats are normal on room  air  9. UTI (urinary tract infection) -completes levaquin 5/7 -encourage po fluids -has foley in place for urinary retention, as well  10. Fall in home -etiology unclear--does not remember event ? Syncope from AS  Functional status:  Dependent in bathing and dressing due to severe weakness at this time  Family/ staff Communication: discussed with nursing staff, pt was seen with the unit supervisor  Labs/tests ordered:  Palliative care referral

## 2013-09-08 ENCOUNTER — Non-Acute Institutional Stay (SKILLED_NURSING_FACILITY): Payer: Medicare Other | Admitting: Internal Medicine

## 2013-09-08 ENCOUNTER — Encounter: Payer: Self-pay | Admitting: Internal Medicine

## 2013-09-08 DIAGNOSIS — C787 Secondary malignant neoplasm of liver and intrahepatic bile duct: Secondary | ICD-10-CM

## 2013-09-08 DIAGNOSIS — C259 Malignant neoplasm of pancreas, unspecified: Secondary | ICD-10-CM

## 2013-09-08 DIAGNOSIS — C439 Malignant melanoma of skin, unspecified: Secondary | ICD-10-CM

## 2013-09-21 NOTE — Progress Notes (Signed)
Patient ID: Danny Waters, male   DOB: 09-Jun-1929, 80 y.o.   MRN: [id number]  Location: [facility] Provider:  Jonelle Sidle L. Mariea Clonts, D.O., C.M.D.  Code Status:  DNR  Chief Complaint  Patient presents with  . Acute Visit    unresponsive, blue    HPI:  80 yo white male here for palliative care upon transfer from [facility].  He has metastatic pancreatic cancer and also has had melanoma.  He has been here on comfort care.  Today he was eating his dinner and slumped over in his chair suddenly and was not able to be aroused.  A CNA called me and the evening supervisor to his room where we found him deceased in his wheelchair.  Review of Systems:  Pt deceased  Medications: Patient's Medications  New Prescriptions   No medications on file  Previous Medications   ASPIRIN 81 MG TABLET    Take 81 mg by mouth daily.   CIPROFLOXACIN (CILOXAN) 0.3 % OPHTHALMIC SOLUTION    Place 2 drops into the left eye every 4 (four) hours while awake. Administer 1 drop, every 2 hours, while awake, for 2 days. Then 1 drop, every 4 hours, while awake, for the next 5 days.   FEEDING SUPPLEMENT, ENSURE COMPLETE, (ENSURE COMPLETE) LIQD    Take 237 mLs by mouth 2 (two) times daily between meals.   LEVOFLOXACIN (LEVAQUIN) 750 MG TABLET    Take 1 tablet (750 mg total) by mouth daily.   NAPHAZOLINE (NAPHCON) 0.1 % OPHTHALMIC SOLUTION    Place 1 drop into both eyes 4 (four) times daily as needed for irritation.   PANTOPRAZOLE (PROTONIX) 40 MG TABLET    Take 1 tablet (40 mg total) by mouth daily at 12 noon.  Modified Medications   No medications on file  Discontinued Medications   No medications on file    Physical Exam: Pt w/o pulse, respirations, unresponsive, no pupillary reflexes.   Physical Exam  Constitutional:  Pt deceased;  jaundice    Labs reviewed: Basic Metabolic Panel:  Recent Labs  03/28/13 2128 03/29/13 0138  07/14/13 1307 07/16/13 1338 07/17/13 0403 08/20/13 0001  08/20/13 0835  NA 129* 137  < > 139 144 145 142  --   K 3.5 3.3*  < > 3.2* 3.6* 4.0 5.0  --   CL 86* 94*  < >  --  106 105 102  --   CO2 31 31  < > 25 27 29   --   --   GLUCOSE 524* 238*  < > 104 126* 110* 156*  --   BUN 19 16  < > 9.4 11 11  26*  --   CREATININE 0.99 0.84  < > 0.7 0.92 0.73 1.20 0.80  CALCIUM 9.8 10.1  < > 9.3 9.4 9.8  --   --   MG  --  1.7  --   --   --   --   --   --   PHOS  --  2.1*  --   --   --   --   --   --   < > = values in this interval not displayed.  Liver Function Tests:  Recent Labs  07/14/13 1307 07/16/13 1338 08/19/13 2353  AST 26 22 39*  ALT 23 20 24   ALKPHOS 500* 437* 617*  BILITOT 1.34* 1.1 3.9*  PROT 5.8* 5.5* 6.3  ALBUMIN 2.8* 2.6* 2.7*    CBC:  Recent Labs  07/07/13  1113 07/14/13 1307  07/18/13 0506 08/19/13 2353 08/20/13 0001 08/20/13 0835  WBC 5.4 6.1  < > 6.5 18.9*  --  15.4*  NEUTROABS 3.1 4.2  --   --  17.1*  --   --   HGB 11.1* 11.1*  < > 10.2* 13.7 14.6 12.6*  HCT 33.9* 33.7*  < > 31.9* 41.7 43.0 39.4  MCV 98.0 98.9*  < > 100.0 99.8  --  101.8*  PLT 90* 135*  < > 159 222  --  200  < > = values in this interval not displayed.   Assessment/Plan: Pancreatic cancer:  Patient deceased from this today at approximately 7pm  Family/ staff Communication: nursing staff called family

## 2013-09-21 DEATH — deceased

## 2013-12-15 ENCOUNTER — Other Ambulatory Visit: Payer: Self-pay | Admitting: Pharmacist

## 2015-01-08 IMAGING — CT CT ABD-PELV W/ CM
2 of 5 series · 14 of 46 positions shown, 16 images · IV contrast (APPLIED)
Comparison: 06/19/2011

CLINICAL DATA: Leukocytosis, elevated alk phos, hyperglycemia

EXAM:
CT ABDOMEN AND PELVIS WITH CONTRAST
TECHNIQUE: Multidetector CT imaging of the abdomen and pelvis was performed
using the standard protocol following bolus administration of
intravenous contrast.
CONTRAST:  100mL OMNIPAQUE IOHEXOL 300 MG/ML  SOLN

[Series 2: abd/ pelvis 5.0 i30f 1 · axial · 0.87mm/px · z∈[-969,-529]mm · 11 of 100 slices shown, 13 images]
[im 6/100  soft-tissue]
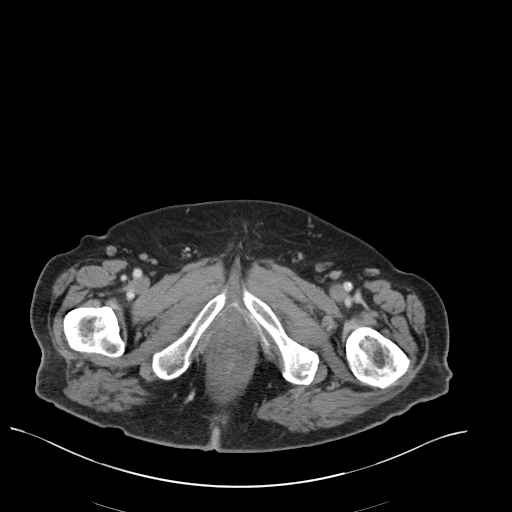
[im 6/100  bone]
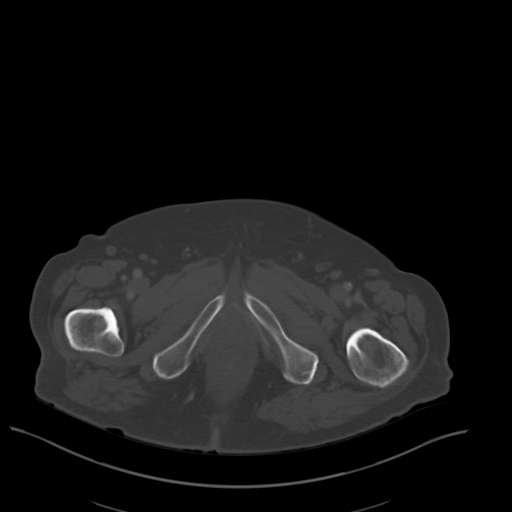
[im 16/100  soft-tissue]
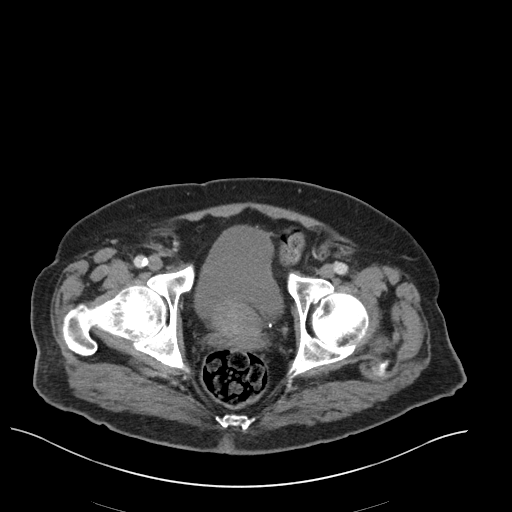
[im 27/100  soft-tissue]
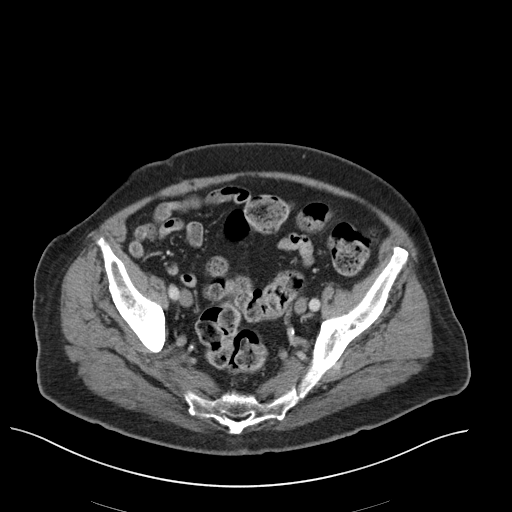
[im 32/100  soft-tissue]
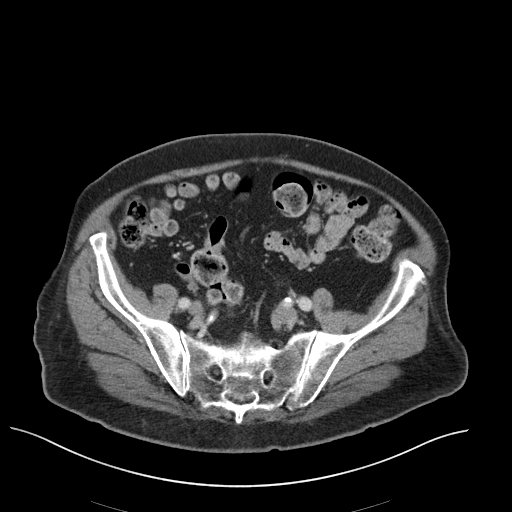
[im 42/100  soft-tissue]
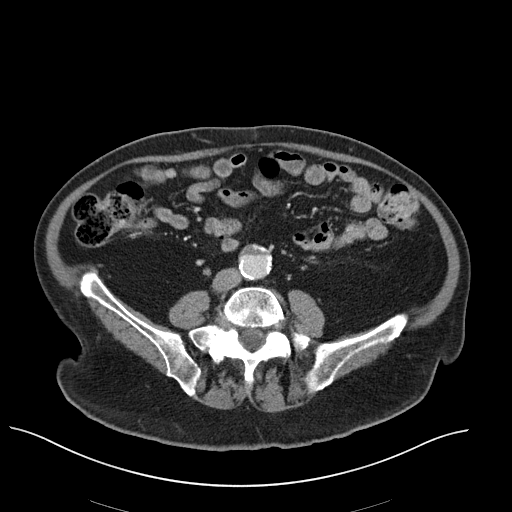
[im 53/100  soft-tissue]
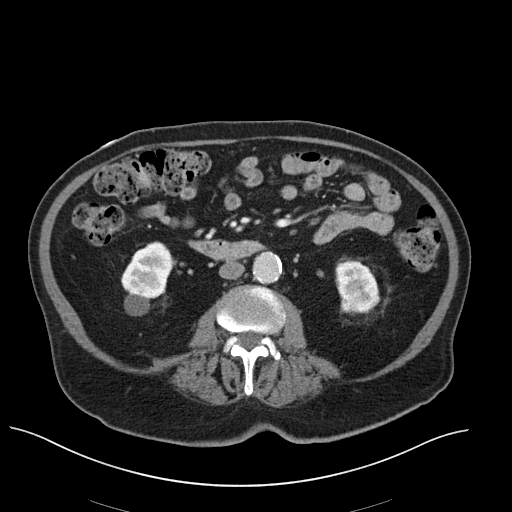
[im 58/100  soft-tissue]
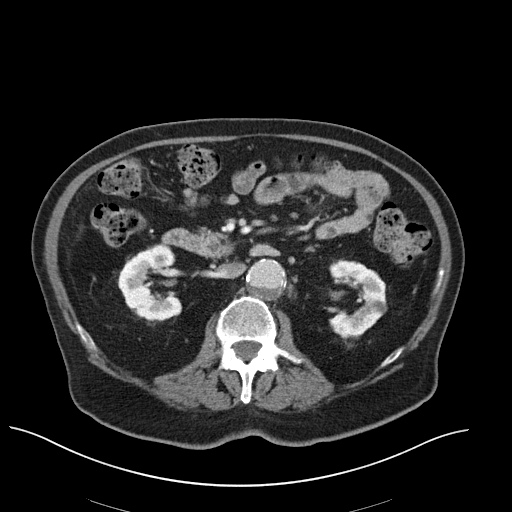
[im 68/100  soft-tissue]
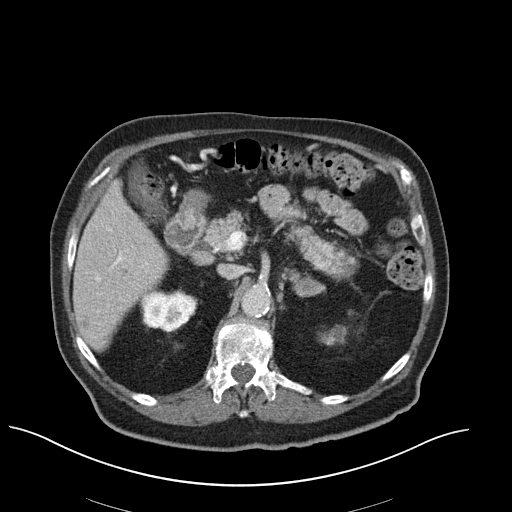
[im 73/100  soft-tissue]
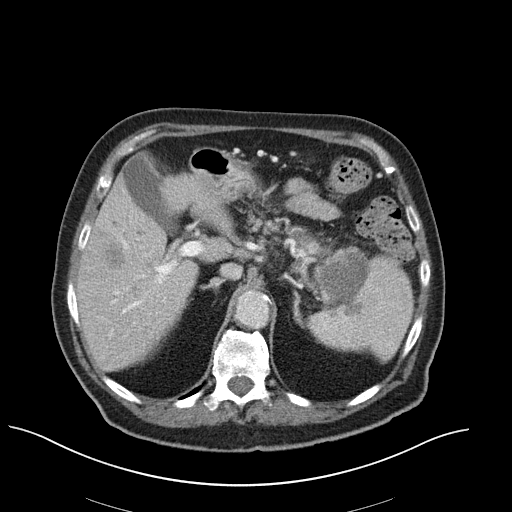
[im 73/100  bone]
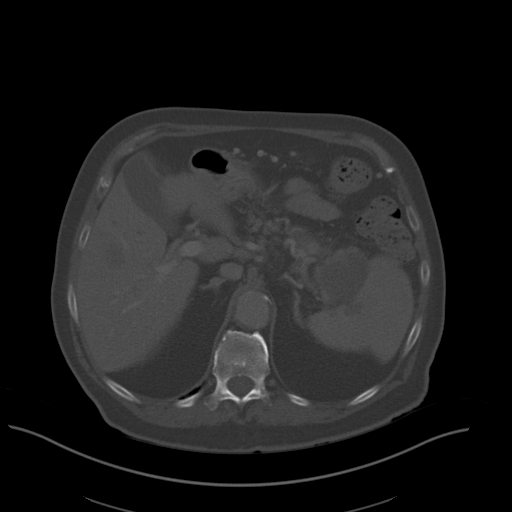
[im 84/100  soft-tissue]
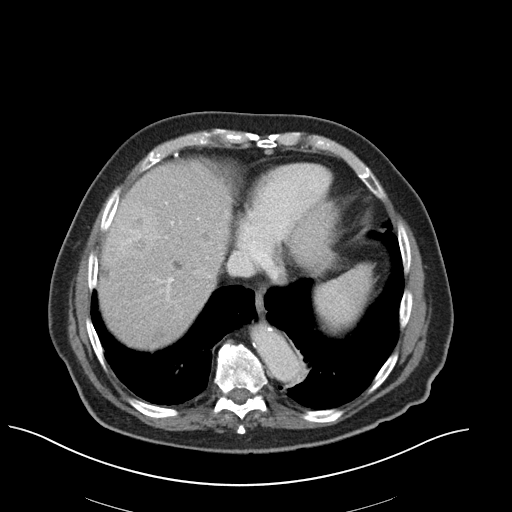
[im 94/100  soft-tissue]
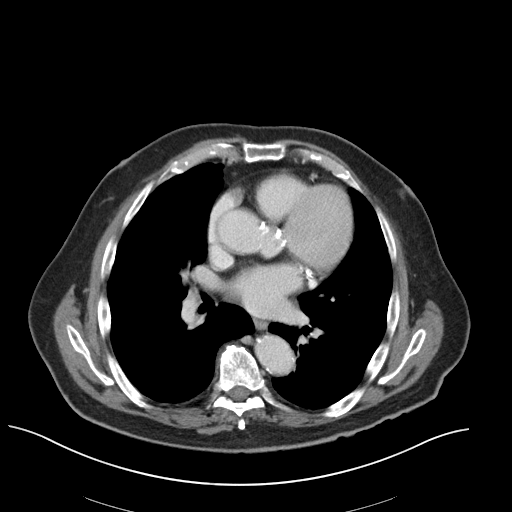

[Series 5: cor · coronal · 0.65mm/px · 3 of 130 slices shown]
[im 44/130  soft-tissue]
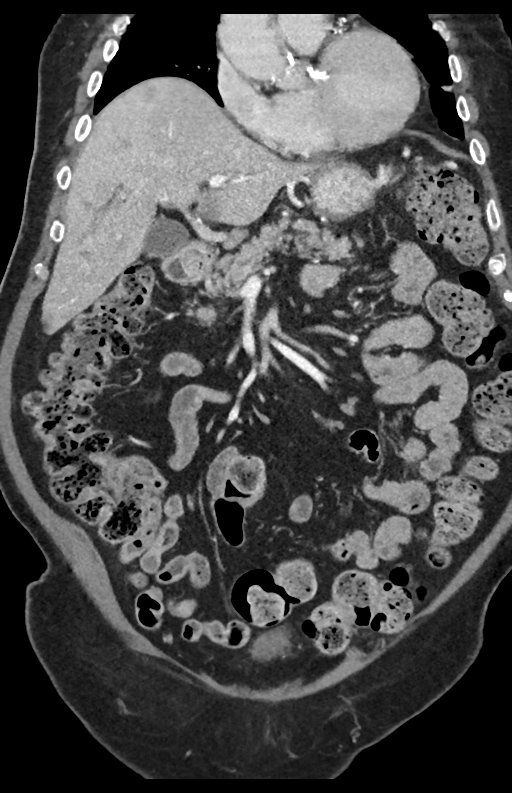
[im 58/130  soft-tissue]
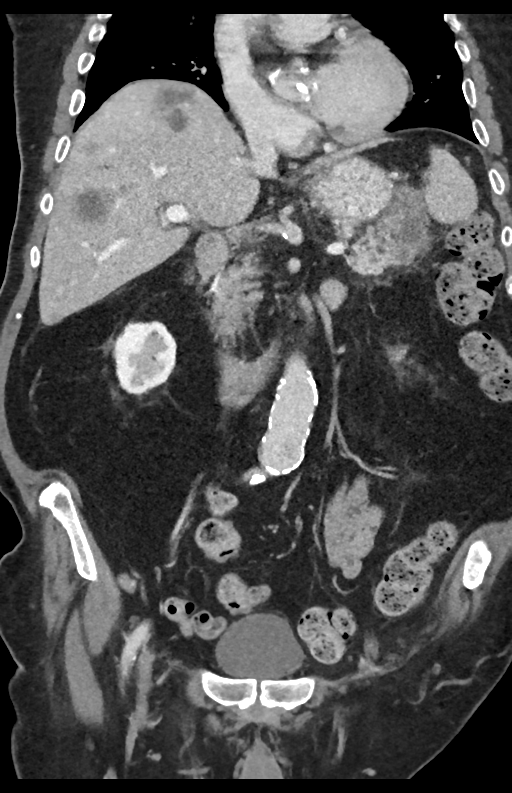
[im 72/130  soft-tissue]
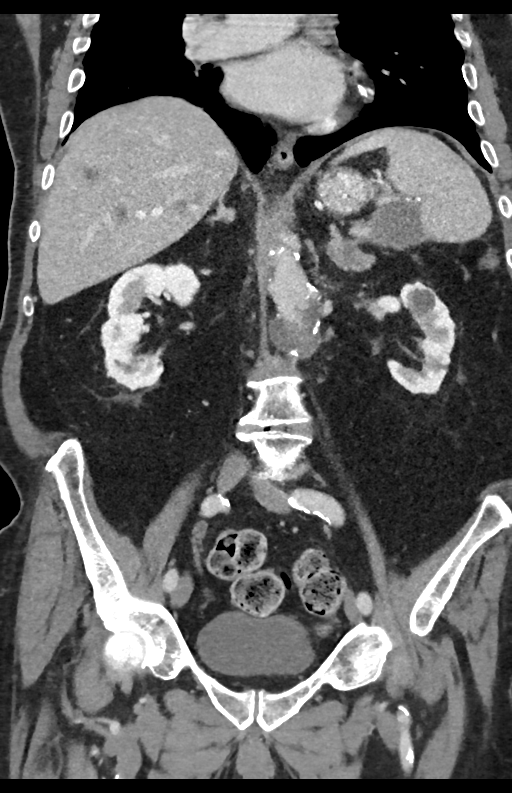

[14 of 46 positions shown; findings below may reference images not displayed]

FINDINGS: Mild scarring in the bilateral lower lobes.

Cardiomegaly. Coronary atherosclerosis. Atherosclerotic
calcifications of the aortic valve.

2.2 x 2.6 cm probable hemangioma in the posterior segment right
hepatic lobe (series 2/ image 26), unchanged. Numerous additional
hypoenhancing hepatic lesions in both lobes, new, suspicious for
metastases. Dominant lesions include:

--2.8 x 2.6 cm lesion in the posterior right hepatic dome (series 2/
image 20)

--1.5 x 1.4 cm lesion in the lateral segment left hepatic lobe
(series 2/image 23)

--2.9 x 2.6 cm lesion in the anterior segment right hepatic lobe
(series 2/image 26)

5.3 x 4.4 x 4.6 cm hypoenhancing mass in the pancreatic tail (series
2/ image 29), highly suspicious for primary pancreatic neoplasm.

Possible direct invasion versus metastasis in the splenic hilum
(series 2/ image 27).

Mild nodularity of the bilateral adrenal glands with suspected
cm left adrenal metastasis (series 2/image 33).

Gallbladder is unremarkable. No intrahepatic or extrahepatic ductal
dilatation.

Multiple bilateral renal cysts measuring up to 2.2 cm in the
posterior right lower pole (series 7/image 20) and 1.2 cm in the
left lateral interpolar region (series 7/ image 16). Bilateral renal
cortical scarring. No hydronephrosis.

No evidence of bowel obstruction.

Atherosclerotic calcifications of the abdominal aorta and branch
vessels. 3.7 x 3.3 cm infrarenal abdominal aortic aneurysm (series
2/ image 42). Additional fusiform ectasia measuring up to 3.2 x
cm just above the aortic bifurcation.

No abdominopelvic ascites.

10 mm short axis retroperitoneal noted just above the left renal
vein (series 2/image 34).

Prostate is notable for margin of the central gland which indents
the base of the bladder.

Bladder is unremarkable.

Degenerative changes of the visualized thoracolumbar spine.
IMPRESSION: 5.3 cm pancreatic tail mass, highly suspicious for primary
pancreatic neoplasm.

Numerous hepatic metastases in both lobes, with index lesions as
described above.

Direct invasion versus splenic metastasis.  Left adrenal metastasis.

Additional ancillary findings as above.
